# Patient Record
Sex: Female | Born: 1965
Health system: Southern US, Community
[De-identification: ages and names within clinical notes are randomized; demographics above are authoritative.]

## PROBLEM LIST (undated history)

## (undated) DIAGNOSIS — Z0000 Encounter for general adult medical examination without abnormal findings: Secondary | ICD-10-CM

## (undated) DIAGNOSIS — R102 Pelvic and perineal pain: Secondary | ICD-10-CM

## (undated) DIAGNOSIS — G43909 Migraine, unspecified, not intractable, without status migrainosus: Secondary | ICD-10-CM

## (undated) DIAGNOSIS — S82409A Unspecified fracture of shaft of unspecified fibula, initial encounter for closed fracture: Secondary | ICD-10-CM

## (undated) DIAGNOSIS — K5790 Diverticulosis of intestine, part unspecified, without perforation or abscess without bleeding: Secondary | ICD-10-CM

## (undated) DIAGNOSIS — K219 Gastro-esophageal reflux disease without esophagitis: Secondary | ICD-10-CM

## (undated) DIAGNOSIS — Z8489 Family history of other specified conditions: Secondary | ICD-10-CM

## (undated) DIAGNOSIS — E782 Mixed hyperlipidemia: Secondary | ICD-10-CM

## (undated) DIAGNOSIS — N941 Unspecified dyspareunia: Secondary | ICD-10-CM

## (undated) DIAGNOSIS — R21 Rash and other nonspecific skin eruption: Secondary | ICD-10-CM

## (undated) DIAGNOSIS — J302 Other seasonal allergic rhinitis: Secondary | ICD-10-CM

## (undated) DIAGNOSIS — Z86010 Personal history of colonic polyps: Secondary | ICD-10-CM

## (undated) DIAGNOSIS — J45909 Unspecified asthma, uncomplicated: Secondary | ICD-10-CM

## (undated) DIAGNOSIS — K649 Unspecified hemorrhoids: Secondary | ICD-10-CM

## (undated) DIAGNOSIS — Z9889 Other specified postprocedural states: Secondary | ICD-10-CM

## (undated) DIAGNOSIS — R112 Nausea with vomiting, unspecified: Secondary | ICD-10-CM

## (undated) DIAGNOSIS — L709 Acne, unspecified: Secondary | ICD-10-CM

## (undated) DIAGNOSIS — N3010 Interstitial cystitis (chronic) without hematuria: Secondary | ICD-10-CM

## (undated) DIAGNOSIS — Z87442 Personal history of urinary calculi: Secondary | ICD-10-CM

## (undated) DIAGNOSIS — M1990 Unspecified osteoarthritis, unspecified site: Secondary | ICD-10-CM

## (undated) DIAGNOSIS — K589 Irritable bowel syndrome without diarrhea: Principal | ICD-10-CM

## (undated) DIAGNOSIS — T7840XA Allergy, unspecified, initial encounter: Secondary | ICD-10-CM

## (undated) DIAGNOSIS — Z8719 Personal history of other diseases of the digestive system: Secondary | ICD-10-CM

## (undated) DIAGNOSIS — Z5189 Encounter for other specified aftercare: Secondary | ICD-10-CM

## (undated) HISTORY — DX: Encounter for general adult medical examination without abnormal findings: Z00.00

## (undated) HISTORY — DX: Allergy, unspecified, initial encounter: T78.40XA

## (undated) HISTORY — DX: Gastro-esophageal reflux disease without esophagitis: K21.9

## (undated) HISTORY — DX: Rash and other nonspecific skin eruption: R21

## (undated) HISTORY — DX: Mixed hyperlipidemia: E78.2

## (undated) HISTORY — PX: ABDOMINAL HYSTERECTOMY: SHX81

## (undated) HISTORY — PX: HEMORROIDECTOMY: SUR656

## (undated) HISTORY — DX: Unspecified osteoarthritis, unspecified site: M19.90

## (undated) HISTORY — PX: TOTAL KNEE ARTHROPLASTY: SHX125

## (undated) HISTORY — DX: Acne, unspecified: L70.9

## (undated) HISTORY — DX: Unspecified asthma, uncomplicated: J45.909

## (undated) HISTORY — DX: Irritable bowel syndrome without diarrhea: K58.9

## (undated) HISTORY — DX: Encounter for other specified aftercare: Z51.89

## (undated) HISTORY — PX: APPENDECTOMY: SHX54

---

## 2007-05-30 ENCOUNTER — Ambulatory Visit: Payer: Self-pay | Admitting: Gastroenterology

## 2007-05-30 LAB — CONVERTED CEMR LAB
Basophils Relative: 0.8 % (ref 0.0–1.0)
Eosinophils Relative: 1.4 % (ref 0.0–5.0)
Ferritin: 18.4 ng/mL (ref 10.0–291.0)
Folate: 16.7 ng/mL
Iron: 80 ug/dL (ref 42–145)
Monocytes Relative: 5.3 % (ref 3.0–11.0)
Neutro Abs: 4.2 10*3/uL (ref 1.4–7.7)
Platelets: 262 10*3/uL (ref 150–400)
RBC: 4.36 M/uL (ref 3.87–5.11)
RDW: 11.6 % (ref 11.5–14.6)
Saturation Ratios: 22.4 % (ref 20.0–50.0)
Vitamin B-12: 539 pg/mL (ref 211–911)
WBC: 5.6 10*3/uL (ref 4.5–10.5)

## 2007-09-24 DIAGNOSIS — M129 Arthropathy, unspecified: Secondary | ICD-10-CM | POA: Insufficient documentation

## 2007-09-24 DIAGNOSIS — Z87442 Personal history of urinary calculi: Secondary | ICD-10-CM | POA: Insufficient documentation

## 2007-09-24 DIAGNOSIS — G43909 Migraine, unspecified, not intractable, without status migrainosus: Secondary | ICD-10-CM | POA: Insufficient documentation

## 2007-09-24 DIAGNOSIS — N809 Endometriosis, unspecified: Secondary | ICD-10-CM | POA: Insufficient documentation

## 2010-12-13 NOTE — Assessment & Plan Note (Signed)
[facility]                         GASTROENTEROLOGY OFFICE NOTE   NAME:Cabrera, Alexandra NISHI                        MRN:          [id number]  DATE:05/30/2007                            DOB:          January 05, 1966    Ms. 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   [name] has had chronic problems with hemorrhoids and rectal bleeding and  has had hemorrhoidectomy in 1991-92.  She also has been bothered  with  endometriosis and has had a hysterectomy and appendectomy but has not  had an oophorectomy.  For several years, she has had intermittent rectal  bleeding, but recently this has been more severe and she describes  passage of blood clots.  Also, over the last three months, she has had a  constant, dull, aching sensation in her right lower quadrant with no  real association to bowel movements or other gastrointestinal function.  She saw Dr. Creta Levin, her primary care physician, who apparently did a  pelvic ultrasound exam, which was normal.  The patient denies associated  fever, chills, skin rashes, joint pains, oral stomatitis, general  malaise or other systemic complaints.  She gives no history of hepatitis  or pancreatitis and denies hepatobiliary or upper GI problems.  She  generally has loose bowel movements several times a day and denies  problems with constipation.  She has never had barium studies of her  bowels or colonoscopy.   The patient follows a regular diet and denies any specific food  intolerances.  She has had no anorexia or weight loss.  Her rectal  bleeding seems to be from a local source, and she is somewhat fixated on  her anorectal area and says that it appears horrible.  It sounds like  she does not use any local anal salves or creams.  She works in  [place], [place], and comes to [place] for evaluation.   PAST MEDICAL HISTORY:  1. Problems with arthritis  with a total right knee replacement and      seven revisions.  2. Problems with kidney stones and had cystoscopy and a temporary      ureteral stent placed. 3.  Hemorrhoidectomy.  3. Hysterectomy.  4. Appendectomy.  5. Rather severe migraine headaches and is on multiple medications.   She denies other chronic medical problems.   MEDICATIONS:  1. Topamax 200 mg twice a day.  2. Provigil 200 mg on weekends.  3. Maxalt 10 mg p.r.n.  4. Aspirin 81 mg a day.  5. Multivitamins daily.  6. Flaxseed oil 1000 mg a day.  7. Green tea daily.   ALLERGIES/SENSITIVITIES:  1. LATEX ALLERGY.  2. SENSITIVITY TO CODEINE.  3. ITCHING WITH MORPHINE.  4. ITCHING AND A RASH WITH PHENERGAN USES.  She uses Zyrtec on a p.r.n. basis.   FAMILY HISTORY:  Remarkable for her father, who has had colon polyps.  The patient relates that she was told that she had anorectal polyps at  the time of her hemorrhoid surgery.  Family history is also remarkable  for atherosclerotic cardiovascular  disease and diabetes.   SOCIAL HISTORY:  The patient is married and lives with her husband, has  a Naval architect.  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  She denies the use of cigarettes or alcohol.   REVIEW OF SYSTEMS:  Unremarkable otherwise.  She denies any current  Cardiovascular, Pulmonary, Genitourinary, or other Endocrine problems.  She does have chronic pain and swelling in her right knee but denies  NSAID abuse but does use p.r.n. Lodine.  She has migraine headaches;  these seem to be under fairly good control.   PHYSICAL EXAMINATION:  GENERAL:  She is an attractive, healthy-  appearing, white female in no distress.  VITAL SIGNS:  She is 5 feet tall, weighs 114 pounds.  Blood pressure is  120/60, pulse was 100 and regular.  HEENT/NECK:  I could not appreciate stigmata of chronic liver disease or  thyromegaly or lymphadenopathy.  CHEST:  Chest.  HEART:  Regular rhythm without  murmurs, gallops, or rubs.  ABDOMEN:  I could not appreciate hepatosplenomegaly, abdominal masses,  or significant tenderness.  Bowel sounds were normal.  EXTREMITIES:  There was no peripheral edema or phlebitis.  NEURO:  Mental status was clear.  RECTAL EXAM:  Inspection of the rectum was fairly unremarkable without  any active fissures or fistulae or protruding hemorrhoids.  Rectal exam  showed no masses or tenderness with some formed stool in the rectal  vault that was guaiac negative.   ASSESSMENT:  1. Intermittent rectal bleeding, which certainly sounds like it is      from an internal hemorrhoid source.  Of course, with her family      history of colon polyps, we need to exclude  colonic polyposis, she      has never had colonoscopy or barium enema exams.  2. History of rather severe migraine headaches with multiple      medications.  3. Right lower quadrant pain of unexplained etiology - rule out      inflammatory bowel disease.  4. Status post hysterectomy and appendectomy.  5. History of kidney stones.   RECOMMENDATIONS:  I have discussed colonoscopy and alternative methods  of examining the colon with Alexandra Cabrera including CT colonoscopy.  We looked  into this matter, and her insurance company will not pay for this, she  does not wish to proceed along these lines, and we will schedule  colonoscopy.  I reviewed the risks and benefits in my experience with  her in detail.  If her colonoscopy is unremarkable and we see prominent  hemorrhoids, we will probably consider hypertonic saline injection at  that time.  I have asked her to continue all of her other medications  outlined above, and I have sent her by the lab to check a CBC and anemia  profile.     Vania Rea. Jarold Motto, MD, Caleen Essex, FAGA  Electronically Signed    DRP/MedQ  DD: 05/30/2007  DT: 05/30/2007  Job #: [id number]   cc:   Warrick Parisian, M.D.

## 2011-08-10 DIAGNOSIS — N3010 Interstitial cystitis (chronic) without hematuria: Secondary | ICD-10-CM | POA: Insufficient documentation

## 2011-12-22 ENCOUNTER — Encounter (HOSPITAL_COMMUNITY): Payer: Self-pay | Admitting: Nurse Practitioner

## 2011-12-22 ENCOUNTER — Emergency Department (HOSPITAL_BASED_OUTPATIENT_CLINIC_OR_DEPARTMENT_OTHER)
Admission: EM | Admit: 2011-12-22 | Discharge: 2011-12-22 | Disposition: A | Discharge status: Other Acute Inpatient Hospital | Payer: 59 | Source: Home / Self Care | Attending: Emergency Medicine | Admitting: Emergency Medicine

## 2011-12-22 ENCOUNTER — Emergency Department (HOSPITAL_BASED_OUTPATIENT_CLINIC_OR_DEPARTMENT_OTHER): Payer: 59

## 2011-12-22 ENCOUNTER — Emergency Department (HOSPITAL_COMMUNITY)
Admission: EM | Admit: 2011-12-22 | Discharge: 2011-12-22 | Disposition: A | Discharge status: Home / Self Care | Payer: 59 | Attending: Emergency Medicine | Admitting: Emergency Medicine

## 2011-12-22 ENCOUNTER — Encounter (HOSPITAL_BASED_OUTPATIENT_CLINIC_OR_DEPARTMENT_OTHER): Payer: Self-pay | Admitting: *Deleted

## 2011-12-22 DIAGNOSIS — R51 Headache: Secondary | ICD-10-CM | POA: Insufficient documentation

## 2011-12-22 DIAGNOSIS — H5710 Ocular pain, unspecified eye: Secondary | ICD-10-CM | POA: Insufficient documentation

## 2011-12-22 DIAGNOSIS — H538 Other visual disturbances: Secondary | ICD-10-CM

## 2011-12-22 DIAGNOSIS — H5712 Ocular pain, left eye: Secondary | ICD-10-CM

## 2011-12-22 HISTORY — DX: Migraine, unspecified, not intractable, without status migrainosus: G43.909

## 2011-12-22 MED ORDER — TETRACAINE HCL 0.5 % OP SOLN
2.0000 [drp] | Freq: Once | OPHTHALMIC | Status: AC
  Administered 2011-12-22: 2 [drp] via OPHTHALMIC
  Filled 2011-12-22: qty 2

## 2011-12-22 NOTE — ED Provider Notes (Signed)
I saw and evaluated the patient, reviewed the resident's note and I agree with the findings and plan.  Ethelda Chick, MD 12/22/11 2138

## 2011-12-22 NOTE — ED Provider Notes (Signed)
History     CSN: [id number]  Arrival date & time 12/22/11  1311   First MD Initiated Contact with Patient 12/22/11 1306      Chief Complaint  Patient presents with  . Migraine    (Consider location/radiation/quality/duration/timing/severity/associated sxs/prior treatment) HPI The patient is a 46 year old female with a history of migraines. She reports that this morning she experienced her typical migraine with some associated nausea. She took Imitrex for this as well as 800 mg of Motrin by mouth. Patient had improvement in her headache but did note right before she left her home to leave for work that she had blurry vision in her left eye. Patient does not normally have complicated migraines with any associated visual or neurologic changes.She denies any injury to the eye.  She denies eye pain or headache at this time. 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 This is very abnormal for this patient. Patient denies any other neurologic symptoms. She has no other significant medical history. Patient has been feeling well otherwise. There are no other associated or modifying factors. History reviewed. No pertinent past medical history.  Past Surgical History  Procedure Date  . Total knee arthroplasty   . Abdominal hysterectomy   . Appendectomy     History reviewed. No pertinent family history.  History  Substance Use Topics  . Smoking status: Never Smoker   . Smokeless tobacco: Not on file  . Alcohol Use: No    OB History    Grav Para Term Preterm Abortions TAB SAB Ect Mult Living                  Review of Systems  Constitutional: Negative.   Eyes: Positive for visual disturbance.  Respiratory: Negative.   Cardiovascular: Negative.   Gastrointestinal: Negative.   Genitourinary: Negative.   Musculoskeletal: Negative.   Skin: Negative.   Neurological: Positive for  headaches.  Hematological: Negative.   Psychiatric/Behavioral: Negative.   All other systems reviewed and are negative.    Allergies  Latex and Dilaudid  Home Medications   Current Outpatient Rx  Name Route Sig Dispense Refill  . ESTRADIOL ACETATE PO Oral Take by mouth.    . IMITREX PO Oral Take by mouth.      BP 134/88  Pulse 80  Temp(Src) 98 F (36.7 C) (Oral)  Resp 20  SpO2 100%  Physical Exam  Nursing note and vitals reviewed. Constitutional: She is oriented to person, place, and time. She appears well-developed and well-nourished. No distress.  HENT:  Head: Normocephalic and atraumatic.  Eyes: Conjunctivae and EOM are normal. No scleral icterus. Pupils are unequal.       tono pen exam with left pressure of 14 and right of 10  Neck: Normal range of motion.  Cardiovascular: Normal rate and regular rhythm.   Pulmonary/Chest: Effort normal.  Musculoskeletal: Normal range of motion.  Neurological: She is alert and oriented to person, place, and time. She exhibits normal muscle tone. Coordination normal.       Visual acuity R eye 20/25 L eye 20/50, both: 20/25  Skin: Skin is warm and dry. No rash noted.  Psychiatric: She has a normal mood and affect.    ED Course  Procedures (including critical care time)  Labs Reviewed - No data to display Ct Head Wo Contrast  12/22/2011  *RADIOLOGY REPORT*  Clinical Data: Headache  and blurred vision.  CT HEAD WITHOUT CONTRAST  Technique:  Contiguous axial images were obtained from the base of the skull through the vertex without contrast.  Comparison: None.  Findings: The brain appears normal without evidence of infarction, hemorrhage, mass lesion, mass effect, midline shift or abnormal extra-axial fluid collection.  No hydrocephalus or pneumocephalus. Imaged paranasal sinuses and mastoid air cells are clear. Calvarium is intact.  IMPRESSION: Negative exam.  Original Report Authenticated By: Bernadene Bell. D'ALESSIO, M.D.     1.  Blurred vision, left eye       MDM  Patient was evaluated by myself. Patient did have a persistently dilated left pupil despite resolution of her migraine. Patient has never had this before. This was also associated with blurred vision in the left eye. During the patient's visit there were moments where her left side did not appear as dilated however her visual changes never resolved. Patient had a noncontrasted CT that was within normal limits. Eye exam was unrevealing. I discussed the patient with neurology attending, Dr. Thad Ranger. She felt that the patient would need a dilated eye exam today. She did not advise further imaging at this time. I spoke with Dr. Gwen Pounds who was the attending ophthalmologist on call. He agreed to evaluate the patient at [facility] but also could not think of a clear explanation for the patient's presentation particularly given that there had been a few brief moments where her her pupillary discrepancy had resolved. Patient was sent to [facility] for dilated eye exam with Dr. Gwen Pounds. Patient was discharged in good condition and will be taken by personal vehicle driven by her husband to [facility] for further evaluation.  Cyndra Numbers, MD 12/22/11 1623

## 2011-12-22 NOTE — ED Notes (Signed)
Report called to Christy Gentles RN

## 2011-12-22 NOTE — Discharge Instructions (Signed)
Blurred Vision You have been seen today complaining of blurred vision. This means you have a loss of ability to see small details.  CAUSES  Blurred vision can be a symptom of underlying eye problems, such as:  Aging of the eye (presbyopia).   Glaucoma.   Cataracts.   Eye infection.   Eye-related migraine.   Diabetes mellitus.   Fatigue.   Migraine headaches.   High blood pressure.   Breakdown of the back of the eye (macular degeneration).   Problems caused by some medications.  The most common cause of blurred vision is the need for eyeglasses or a new prescription. Today in the emergency department, no cause for your blurred vision can be found. SYMPTOMS  Blurred vision is the loss of visual sharpness and detail (acuity). DIAGNOSIS  Should blurred vision continue, you should see your caregiver. If your caregiver is your primary care physician, he or she may choose to refer you to another specialist.  TREATMENT  Do not ignore your blurred vision. Make sure to have it checked out to see if further treatment or referral is necessary. SEEK MEDICAL CARE IF:  You are unable to get into a specialist so we can help you with a referral. SEEK IMMEDIATE MEDICAL CARE IF: You have severe eye pain, severe headache, or sudden loss of vision. MAKE SURE YOU:   Understand these instructions.   Will watch your condition.   Will get help right away if you are not doing well or get worse.  Document Released: 07/20/2003 Document Revised: 07/06/2011 Document Reviewed: 02/19/2008 [facility] Patient Information 2012 [place], [place].Visual Disturbances You have had a disturbance in your vision. This may be caused by various conditions, such as:  Migraines. Migraine headaches are often preceded by a disturbance in vision. Blind spots or light flashes are followed by a headache. This type of visual disturbance is temporary. It does not damage the eye.   Glaucoma. This is caused by increased  pressure in the eye. Symptoms include haziness, blurred vision, or seeing rainbow colored circles when looking at bright lights. Partial or complete visual loss can occur. You may or may not experience eye pain. Visual loss may be gradual or sudden and is irreversible. Glaucoma is the leading cause of blindness.   Retina problems. Vision will be reduced if the retina becomes detached or if there is a circulation problem as with diabetes, high blood pressure, or a mini-stroke. Symptoms include seeing "floaters," flashes of light, or shadows, as if a curtain has fallen over your eye.   Optic nerve problems. The main nerve in your eye can be damaged by redness, soreness, and swelling (inflammation), poor circulation, drugs, and toxins.  It is very important to have a complete exam done by a specialist to determine the exact cause of your eye problem. The specialist may recommend medicines or surgery, depending on the cause of the problem. This can help prevent further loss of vision or reduce the risk of having a stroke. Contact the caregiver to whom you have been referred and arrange for follow-up care right away. SEEK IMMEDIATE MEDICAL CARE IF:   Your vision gets worse.   You develop severe headaches.   You have any weakness or numbness in the face, arms, or legs.   You have any trouble speaking or walking.  Document Released: 08/24/2004 Document Revised: 07/06/2011 Document Reviewed: 12/15/2009 [facility] Patient Information 2012 [place], [place].

## 2011-12-22 NOTE — ED Notes (Signed)
Headache this am. Pupils are unequal on arrival. Right pupil 3mm left pupil 5mm both react to light. Headache was relieved by Imitrex.

## 2011-12-22 NOTE — ED Notes (Signed)
Pt states she noticed her L pupil was dilated this am, went to med center had ct scan and was told to come directly to [facility] to meet with dr Allen Kell for eye exam. States ct scan was normal. C/o "pressure" behind L eye now. Denies any injuries. Did have a migraine this am, which she took imitrex for at 0800

## 2011-12-22 NOTE — ED Provider Notes (Signed)
History     CSN: [id number]  Arrival date & time 12/22/11  1628   First MD Initiated Contact with Patient 12/22/11 1647      Chief Complaint  Patient presents with  . Eye Problem    (Consider location/radiation/quality/duration/timing/severity/associated sxs/prior treatment) HPI Comments: Pain behind L eye today different from typical headaches.  L pupil larger than right today.  CT at OSH ED.  Sent for eval by ophtho.  Otherwise doing well.  Patient is a 46 y.o. female presenting with eye problem. The history is provided by the patient.  Eye Problem  This is a new problem. Episode onset: today. The problem occurs constantly. The problem has not changed since onset.There is pain in the left eye. There was no injury mechanism. The pain is mild (posterior eye pressure). There is no history of trauma to the eye. Pertinent negatives include no numbness, no double vision, no eye redness, no nausea, no vomiting and no weakness. She has tried nothing for the symptoms.    Past Medical History  Diagnosis Date  . Migraines     Past Surgical History  Procedure Date  . Total knee arthroplasty   . Abdominal hysterectomy   . Appendectomy     History reviewed. No pertinent family history.  History  Substance Use Topics  . Smoking status: Never Smoker   . Smokeless tobacco: Not on file  . Alcohol Use: No    OB History    Grav Para Term Preterm Abortions TAB SAB Ect Mult Living                  Review of Systems  Constitutional: Negative for activity change.  HENT: Negative for congestion.   Eyes: Negative for double vision, redness and visual disturbance.  Respiratory: Negative for chest tightness and shortness of breath.   Cardiovascular: Negative for chest pain and leg swelling.  Gastrointestinal: Negative for nausea, vomiting and abdominal pain.  Genitourinary: Negative for dysuria.  Skin: Negative for rash.  Neurological: Negative for syncope, weakness and numbness.    Psychiatric/Behavioral: Negative for behavioral problems.    Allergies  Latex and Dilaudid  Home Medications   Current Outpatient Rx  Name Route Sig Dispense Refill  . ESTRADIOL ACETATE PO Oral Take 0.5 mg by mouth daily.     Marland Kitchen FLAX SEEDS PO Oral Take 1 capsule by mouth daily.    . IBUPROFEN 800 MG PO TABS Oral Take 800 mg by mouth every 8 (eight) hours as needed. For pain    . SUMATRIPTAN SUCCINATE 25 MG PO TABS Oral Take 25 mg by mouth every 2 (two) hours as needed. For migraine      BP 131/76  Pulse 77  Temp(Src) 97.9 F (36.6 C) (Oral)  Resp 20  Ht 5' (1.524 m)  Wt 133 lb (60.328 kg)  BMI 25.97 kg/m2  SpO2 100%  Physical Exam  Eyes:       L pupil slightly larger than right.  Good response to light.  Fundoscopic exam without abnormality.    ED Course  Procedures (including critical care time)  Labs Reviewed - No data to display Ct Head Wo Contrast  12/22/2011  *RADIOLOGY REPORT*  Clinical Data: Headache and blurred vision.  CT HEAD WITHOUT CONTRAST  Technique:  Contiguous axial images were obtained from the base of the skull through the vertex without contrast.  Comparison: None.  Findings: The brain appears normal without evidence of infarction, hemorrhage, mass lesion, mass effect, midline shift or  abnormal extra-axial fluid collection.  No hydrocephalus or pneumocephalus. Imaged paranasal sinuses and mastoid air cells are clear. Calvarium is intact.  IMPRESSION: Negative exam.  Original Report Authenticated By: Bernadene Bell. D'ALESSIO, M.D.     1. Eye pain, left       MDM  Pain behind L eye today different from typical headaches.  L pupil larger than right today.  CT at OSH ED.  Sent for eval by ophtho.  Otherwise doing well.  IOPs, visual acuity tested at OSH PTA.  Slightly decreased acuity in L eye at OSH.  VSS and well appearing here.  Ophtho coming to see pt.  6:30 PM Pt says she no longer wants to wait for the ophthalmologist and needs to go back to work.   Etiology of symptoms unclear but no feel outpatient f/u with ophtho to be reasonable.        Army Chaco, MD 12/22/11 [phone]

## 2012-08-07 ENCOUNTER — Ambulatory Visit: Payer: 59 | Admitting: Internal Medicine

## 2012-09-09 ENCOUNTER — Ambulatory Visit (INDEPENDENT_AMBULATORY_CARE_PROVIDER_SITE_OTHER): Payer: 59 | Admitting: Internal Medicine

## 2012-09-09 ENCOUNTER — Encounter: Payer: Self-pay | Admitting: Internal Medicine

## 2012-09-09 ENCOUNTER — Telehealth: Payer: Self-pay | Admitting: *Deleted

## 2012-09-09 ENCOUNTER — Ambulatory Visit (HOSPITAL_BASED_OUTPATIENT_CLINIC_OR_DEPARTMENT_OTHER)
Admission: RE | Admit: 2012-09-09 | Discharge: 2012-09-09 | Disposition: A | Discharge status: Home / Self Care | Payer: 59 | Source: Ambulatory Visit | Attending: Internal Medicine | Admitting: Internal Medicine

## 2012-09-09 VITALS — BP 120/76 | HR 78 | Temp 97.5°F | Resp 18 | Ht 60.0 in | Wt 132.0 lb

## 2012-09-09 DIAGNOSIS — Z1231 Encounter for screening mammogram for malignant neoplasm of breast: Secondary | ICD-10-CM | POA: Insufficient documentation

## 2012-09-09 DIAGNOSIS — Z780 Asymptomatic menopausal state: Secondary | ICD-10-CM | POA: Insufficient documentation

## 2012-09-09 DIAGNOSIS — Z803 Family history of malignant neoplasm of breast: Secondary | ICD-10-CM

## 2012-09-09 DIAGNOSIS — N951 Menopausal and female climacteric states: Secondary | ICD-10-CM | POA: Insufficient documentation

## 2012-09-09 DIAGNOSIS — Z90710 Acquired absence of both cervix and uterus: Secondary | ICD-10-CM

## 2012-09-09 LAB — LIPID PANEL
Cholesterol: 193 mg/dL (ref 0–200)
Total CHOL/HDL Ratio: 3 Ratio
Triglycerides: 89 mg/dL (ref <150)
VLDL: 18 mg/dL (ref 0–40)

## 2012-09-09 LAB — CBC WITH DIFFERENTIAL/PLATELET
Eosinophils Absolute: 0.1 10*3/uL (ref 0.0–0.7)
Eosinophils Relative: 1 % (ref 0–5)
HCT: 41 % (ref 36.0–46.0)
Hemoglobin: 14 g/dL (ref 12.0–15.0)
Lymphs Abs: 1.2 10*3/uL (ref 0.7–4.0)
MCH: 31.3 pg (ref 26.0–34.0)
MCV: 91.5 fL (ref 78.0–100.0)
Monocytes Absolute: 0.4 10*3/uL (ref 0.1–1.0)
Monocytes Relative: 6 % (ref 3–12)
Platelets: 275 10*3/uL (ref 150–400)
RBC: 4.48 MIL/uL (ref 3.87–5.11)

## 2012-09-09 LAB — COMPREHENSIVE METABOLIC PANEL
BUN: 17 mg/dL (ref 6–23)
CO2: 30 mEq/L (ref 19–32)
Calcium: 9.6 mg/dL (ref 8.4–10.5)
Creat: 0.7 mg/dL (ref 0.50–1.10)
Glucose, Bld: 85 mg/dL (ref 70–99)
Total Bilirubin: 0.3 mg/dL (ref 0.3–1.2)

## 2012-09-09 MED ORDER — IBUPROFEN 800 MG PO TABS: ORAL_TABLET | ORAL | Status: DC

## 2012-09-09 MED ORDER — ONDANSETRON 4 MG PO TBDP: 4.0000 mg | ORAL_TABLET | Freq: Three times a day (TID) | ORAL | Status: DC | PRN

## 2012-09-09 NOTE — Progress Notes (Signed)
Subjective:    Patient ID: Alexandra Cabrera, female    DOB: 22-Dec-1965, 47 y.o.   MRN: [id number]  HPI Alexandra Cabrera is a new pt here for first visit.  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    Migraines  She was evaluated by neurologist in [place] and tried Topamax but unable to tolerate.  She uses Imitrex or 800 mg of Motrin now  Surgical menopause.  She reports patches were ineffective and she titrated her Estradiol to  2 mg daily which controls her hot flushes quite well.  FH breast cancer in maternal aunt  Allergies  Allergen Reactions  . Latex Anaphylaxis  . Dilaudid (Hydromorphone Hcl) Other (See Comments)    unknown   Past Medical History  Diagnosis Date  . Migraines    Past Surgical History  Procedure Laterality Date  . Total knee arthroplasty    . Abdominal hysterectomy    . Appendectomy     History   Social History  . Marital Status: Married    Spouse Name: N/A    Number of Children: N/A  . Years of Education: N/A   Occupational History  . Not on file.   Social History Main Topics  . Smoking status: Never Smoker   . Smokeless tobacco: Not on file  . Alcohol Use: No  . Drug Use: No  . Sexually Active:    Other Topics Concern  . Not on file   Social History Narrative  . No narrative on file   No family history on file. Patient Active Problem List  Diagnosis  . MIGRAINE HEADACHE  . ENDOMETRIOSIS  . ARTHRITIS  . NEPHROLITHIASIS, HX OF  . S/P hysterectomy  . Menopause   Current Outpatient Prescriptions on File Prior to Visit  Medication Sig Dispense Refill  . ESTRADIOL ACETATE PO Take 0.5 mg by mouth daily.       . Flaxseed, Linseed, (FLAX SEEDS PO) Take 1 capsule by mouth daily.      Marland Kitchen ibuprofen (ADVIL,MOTRIN) 800 MG tablet Take 800 mg by mouth every 8 (eight) hours as needed. For pain      . SUMAtriptan (IMITREX) 25 MG tablet Take 25 mg by mouth every 2  (two) hours as needed. For migraine       No current facility-administered medications on file prior to visit.       Review of Systems    see HPI Objective:   Physical Exam Physical Exam  Nursing note and vitals reviewed.  Constitutional: She is oriented to person, place, and time. She appears well-developed and well-nourished.  HENT:  Head: Normocephalic and atraumatic.  Cardiovascular: Normal rate and regular rhythm. Exam reveals no gallop and no friction rub.  No murmur heard.  Pulmonary/Chest: Breath sounds normal. She has no wheezes. She has no rales.  Neurological: She is alert and oriented to person, place, and time.  Skin: Skin is warm and dry.  Psychiatric: She has a normal mood and affect. Her behavior is normal.              Assessment & Plan:  Migraine headaches  Will order Motrin 800 mg at onset of headache and q6h prn.  Also RX for Zofran ODT 4 mg prn  Continue Imitrex  Menopause  OK for estradiol  2 mg.  To get mm today as she states she has  not had one in 4-5 years  History of renal calculus  Mm and labs today  Schedule CPE

## 2012-09-09 NOTE — Patient Instructions (Addendum)
Activate my chart  Mammogram today

## 2012-09-10 ENCOUNTER — Encounter: Payer: Self-pay | Admitting: *Deleted

## 2012-09-18 NOTE — Telephone Encounter (Signed)
results

## 2012-12-18 ENCOUNTER — Encounter: Payer: 59 | Admitting: Internal Medicine

## 2013-02-03 ENCOUNTER — Other Ambulatory Visit: Payer: Self-pay | Admitting: *Deleted

## 2013-02-03 MED ORDER — IBUPROFEN 800 MG PO TABS: ORAL_TABLET | ORAL | Status: DC

## 2013-02-03 MED ORDER — ONDANSETRON 4 MG PO TBDP: 4.0000 mg | ORAL_TABLET | Freq: Three times a day (TID) | ORAL | Status: DC | PRN

## 2013-02-03 MED ORDER — SUMATRIPTAN SUCCINATE 25 MG PO TABS: 25.0000 mg | ORAL_TABLET | ORAL | Status: DC | PRN

## 2013-02-03 NOTE — Telephone Encounter (Signed)
Pt did not pick these up when prescribed in Feb because she still had some available she went to pick them up this am and was told by pharmacy she needed a new rx

## 2013-03-27 ENCOUNTER — Other Ambulatory Visit: Payer: Self-pay | Admitting: *Deleted

## 2013-03-27 NOTE — Telephone Encounter (Signed)
Refill request from pharmacy /

## 2013-03-28 ENCOUNTER — Other Ambulatory Visit: Payer: Self-pay | Admitting: *Deleted

## 2013-03-28 NOTE — Telephone Encounter (Signed)
Pt has a CPE on Sept 9th pt is out of medication and has a migraine

## 2013-03-29 MED ORDER — SUMATRIPTAN SUCCINATE 25 MG PO TABS: ORAL_TABLET | ORAL | Status: DC

## 2013-04-01 ENCOUNTER — Other Ambulatory Visit: Payer: Self-pay | Admitting: *Deleted

## 2013-04-01 NOTE — Telephone Encounter (Signed)
Error

## 2013-04-03 ENCOUNTER — Other Ambulatory Visit: Payer: Self-pay | Admitting: *Deleted

## 2013-04-03 MED ORDER — SUMATRIPTAN SUCCINATE 50 MG PO TABS: ORAL_TABLET | ORAL | Status: DC

## 2013-04-03 NOTE — Telephone Encounter (Signed)
Refill request

## 2013-04-08 ENCOUNTER — Ambulatory Visit (INDEPENDENT_AMBULATORY_CARE_PROVIDER_SITE_OTHER): Payer: 59 | Admitting: Internal Medicine

## 2013-04-08 ENCOUNTER — Encounter: Payer: Self-pay | Admitting: Internal Medicine

## 2013-04-08 VITALS — BP 127/83 | HR 73 | Temp 97.0°F | Resp 18 | Wt 130.0 lb

## 2013-04-08 DIAGNOSIS — Z87442 Personal history of urinary calculi: Secondary | ICD-10-CM

## 2013-04-08 DIAGNOSIS — G43909 Migraine, unspecified, not intractable, without status migrainosus: Secondary | ICD-10-CM

## 2013-04-08 DIAGNOSIS — Z90710 Acquired absence of both cervix and uterus: Secondary | ICD-10-CM

## 2013-04-08 DIAGNOSIS — M129 Arthropathy, unspecified: Secondary | ICD-10-CM

## 2013-04-08 DIAGNOSIS — N951 Menopausal and female climacteric states: Secondary | ICD-10-CM

## 2013-04-08 DIAGNOSIS — Z0000 Encounter for general adult medical examination without abnormal findings: Secondary | ICD-10-CM

## 2013-04-08 LAB — POCT URINALYSIS DIPSTICK
Bilirubin, UA: NEGATIVE
Blood, UA: NEGATIVE
Glucose, UA: NEGATIVE
Ketones, UA: NEGATIVE
Leukocytes, UA: NEGATIVE
pH, UA: 6.5

## 2013-04-08 NOTE — Progress Notes (Signed)
Subjective:    Patient ID: Alexandra Cabrera, female    DOB: 03-12-1966, 47 y.o.   MRN: [id number]  HPI  Alexandra Cabrera is here for CPE.  Overall doing well but migraines exacerbate when she is hungry or with lights in ER.  She sometimes low on Imitrex and would like to try Maxalt.   Had some blood in urine a few weeks ago but states  " I must have passed the stone because I have no pain"  She has seen Dr. Lindley Cabrera.  Allergies  Allergen Reactions  . Latex Anaphylaxis  . Phenergan [Promethazine Hcl] Nausea And Vomiting  . Dilaudid [Hydromorphone Hcl] Other (See Comments)    unknown  . Eggs Or Egg-Derived Products   . Reglan [Metoclopramide] Anxiety   Past Medical History  Diagnosis Date  . Migraines    Past Surgical History  Procedure Laterality Date  . Total knee arthroplasty    . Abdominal hysterectomy    . Appendectomy     History   Social History  . Marital Status: Married    Spouse Name: N/A    Number of Children: N/A  . Years of Education: N/A   Occupational History  . Not on file.   Social History Main Topics  . Smoking status: Never Smoker   . Smokeless tobacco: Not on file  . Alcohol Use: No  . Drug Use: No  . Sexual Activity: Yes    Birth Control/ Protection: Surgical   Other Topics Concern  . Not on file   Social History Narrative  . No narrative on file   Family History  Problem Relation Age of Onset  . Diabetes Mother   . Arthritis Father   . Arthritis Sister   . Arthritis Brother   . Cancer Maternal Grandmother 45    ovarian  . Arthritis Paternal Grandmother   . Heart disease Paternal Grandfather    Patient Active Problem List   Diagnosis Date Noted  . S/P hysterectomy 09/09/2012  . Menopause 09/09/2012  . Hot flushes, perimenopausal 09/09/2012  . Family history of breast cancer in female 09/09/2012  . MIGRAINE HEADACHE 09/24/2007  . ENDOMETRIOSIS 09/24/2007  . ARTHRITIS 09/24/2007  . NEPHROLITHIASIS, HX OF 09/24/2007   Current Outpatient  Prescriptions on File Prior to Visit  Medication Sig Dispense Refill  . estradiol (ESTRACE) 2 MG tablet Take 2 mg by mouth daily.      . Flaxseed, Linseed, (FLAX SEEDS PO) Take 1 capsule by mouth daily.      Marland Kitchen ibuprofen (ADVIL,MOTRIN) 800 MG tablet Take one at onset of migraine.  May repeat in 6 hours prn  30 tablet  1  . ondansetron (ZOFRAN ODT) 4 MG disintegrating tablet Take 1 tablet (4 mg total) by mouth every 8 (eight) hours as needed for nausea.  20 tablet  0  . SUMAtriptan (IMITREX) 50 MG tablet Take one at headache onset.  May repeat in 2 hours one time only  10 tablet  1   No current facility-administered medications on file prior to visit.      Review of Systems  Genitourinary: Negative for urgency, frequency and flank pain.  All other systems reviewed and are negative.       Objective:   Physical Exam Physical Exam  Nursing note and vitals reviewed.  Constitutional: She is oriented to person, place, and time. She appears well-developed and well-nourished.  HENT:  Head: Normocephalic and atraumatic.  Right Ear: Tympanic membrane and ear canal normal. No drainage. Tympanic  membrane is not injected and not erythematous.  Left Ear: Tympanic membrane and ear canal normal. No drainage. Tympanic membrane is not injected and not erythematous.  Nose: Nose normal. Right sinus exhibits no maxillary sinus tenderness and no frontal sinus tenderness. Left sinus exhibits no maxillary sinus tenderness and no frontal sinus tenderness.  Mouth/Throat: Oropharynx is clear and moist. No oral lesions. No oropharyngeal exudate.  Eyes: Conjunctivae and EOM are normal. Pupils are equal, round, and reactive to light.  Neck: Normal range of motion. Neck supple. No JVD present. Carotid bruit is not present. No mass and no thyromegaly present.  Cardiovascular: Normal rate, regular rhythm, S1 normal, S2 normal and intact distal pulses. Exam reveals no gallop and no friction rub.  No murmur heard.   Pulses:  Carotid pulses are 2+ on the right side, and 2+ on the left side.  Dorsalis pedis pulses are 2+ on the right side, and 2+ on the left side.  No carotid bruit. No LE edema  Pulmonary/Chest: Breath sounds normal. She has no wheezes. She has no rales. She exhibits no tenderness.   Breast  No discrete mass no nipple discharge no axillary adenopathy bilaterall Abdominal: Soft. Bowel sounds are normal. She exhibits no distension and no mass. There is no hepatosplenomegaly. There is no tenderness. There is no CVA tenderness.  Musculoskeletal: Normal range of motion.  No active synovitis to joints.  Lymphadenopathy:  She has no cervical adenopathy.  She has no axillary adenopathy.  Right: No inguinal and no supraclavicular adenopathy present.  Left: No inguinal and no supraclavicular adenopathy present.  Neurological: She is alert and oriented to person, place, and time. She has normal strength and normal reflexes. She displays no tremor. No cranial nerve deficit or sensory deficit. Coordination and gait normal.  Skin: Skin is warm and dry. No rash noted. No cyanosis. Nails show no clubbing.  Psychiatric: She has a normal mood and affect. Her speech is normal and behavior is normal. Cognition and memory are normal.          Assessment & Plan:  Health Maintenance:  U/A neg today.  UTD with vaccines.  Influenza pending.  See scanned sheet.  Advised calcium and vitamin D  Migraine  Will continue triptan.   She wishes to check with pharmacy and if Maxalt is less expensive  Ok to change to Maxalt  DJD knees and hands    S/P  Hyst BSO/  Hot flushes  Continue  Estradiol 2 mg.  Advised yearly mm  Nephrolithiasis  Neg hgb in urine today.  Managed by Dr. Lindley Cabrera

## 2013-04-08 NOTE — Addendum Note (Signed)
Addended by: Mathews Robinsons on: 04/08/2013 10:39 AM   Modules accepted: Orders

## 2013-05-27 ENCOUNTER — Other Ambulatory Visit: Payer: Self-pay | Admitting: *Deleted

## 2013-05-27 NOTE — Telephone Encounter (Signed)
Refill request

## 2013-05-28 MED ORDER — SUMATRIPTAN SUCCINATE 50 MG PO TABS: ORAL_TABLET | ORAL | Status: DC

## 2013-06-05 ENCOUNTER — Other Ambulatory Visit: Payer: Self-pay

## 2013-06-25 ENCOUNTER — Other Ambulatory Visit: Payer: Self-pay | Admitting: Internal Medicine

## 2013-06-25 MED ORDER — ESTRADIOL 2 MG PO TABS: 2.0000 mg | ORAL_TABLET | Freq: Every day | ORAL | Status: DC

## 2013-06-25 NOTE — Telephone Encounter (Signed)
refilll request

## 2013-06-30 ENCOUNTER — Other Ambulatory Visit: Payer: Self-pay | Admitting: *Deleted

## 2013-08-04 ENCOUNTER — Other Ambulatory Visit: Payer: Self-pay | Admitting: *Deleted

## 2013-08-04 MED ORDER — SUMATRIPTAN SUCCINATE 50 MG PO TABS: ORAL_TABLET | ORAL | Status: DC

## 2013-08-04 NOTE — Telephone Encounter (Signed)
Refill request

## 2013-08-26 ENCOUNTER — Other Ambulatory Visit: Payer: Self-pay | Admitting: Internal Medicine

## 2013-08-27 NOTE — Telephone Encounter (Signed)
Refill request

## 2013-08-28 ENCOUNTER — Other Ambulatory Visit: Payer: Self-pay | Admitting: *Deleted

## 2013-08-28 DIAGNOSIS — Z139 Encounter for screening, unspecified: Secondary | ICD-10-CM

## 2013-08-28 NOTE — Telephone Encounter (Signed)
Notified pt that medication will be called in for 30 days LVM awaiting return call

## 2013-08-28 NOTE — Telephone Encounter (Signed)
[facility] [employer] and let her know that I refilled #30 of her estradiol.  She is due for a repeat mammogram on 2/11  Advise her that she has extreme breast density and should get a 3D mm because of this density..  Schedule this for her any day after 2/11.  I will refill more of her estrogen once I know her mm is fine

## 2013-09-18 ENCOUNTER — Ambulatory Visit (HOSPITAL_COMMUNITY): Payer: 59

## 2013-09-23 ENCOUNTER — Ambulatory Visit (HOSPITAL_COMMUNITY): Admission: RE | Admit: 2013-09-23 | Payer: 59 | Source: Ambulatory Visit

## 2013-10-06 ENCOUNTER — Telehealth: Payer: Self-pay | Admitting: *Deleted

## 2013-10-06 ENCOUNTER — Other Ambulatory Visit: Payer: Self-pay | Admitting: *Deleted

## 2013-10-06 MED ORDER — ESTRADIOL 2 MG PO TABS: 2.0000 mg | ORAL_TABLET | Freq: Every day | ORAL | Status: DC

## 2013-10-06 MED ORDER — RIZATRIPTAN BENZOATE 10 MG PO TABS: 10.0000 mg | ORAL_TABLET | ORAL | Status: DC | PRN

## 2013-10-06 NOTE — Telephone Encounter (Signed)
Pt has appt for MM tomorrow, and is out of Imitrex last time we did maxalt until she could refill her Imitrex because they can only do 10 tabs at a time

## 2013-10-06 NOTE — Telephone Encounter (Signed)
Alexandra Cabrera called wanting to know if she can get a RX for Maxalt for migraines.  She is out of Imitrex and can't get it filled until 11/03/13 and needs something to get her through until then.  Also she is 10 behind on her Estridiol.  She is going for her Mammogram tomorrow and wanted to make sure that we will call that in ASAP.

## 2013-10-07 ENCOUNTER — Ambulatory Visit (HOSPITAL_COMMUNITY)
Admission: RE | Admit: 2013-10-07 | Discharge: 2013-10-07 | Disposition: A | Discharge status: Home / Self Care | Payer: 59 | Source: Ambulatory Visit | Attending: Internal Medicine | Admitting: Internal Medicine

## 2013-10-07 DIAGNOSIS — Z139 Encounter for screening, unspecified: Secondary | ICD-10-CM

## 2013-10-07 DIAGNOSIS — Z1231 Encounter for screening mammogram for malignant neoplasm of breast: Secondary | ICD-10-CM | POA: Insufficient documentation

## 2013-10-28 ENCOUNTER — Other Ambulatory Visit: Payer: Self-pay | Admitting: Internal Medicine

## 2013-10-28 NOTE — Telephone Encounter (Signed)
Refill request MM on 10/07/13

## 2013-11-24 ENCOUNTER — Other Ambulatory Visit: Payer: Self-pay | Admitting: *Deleted

## 2013-11-24 NOTE — Telephone Encounter (Signed)
Refill request

## 2013-11-25 MED ORDER — RIZATRIPTAN BENZOATE 10 MG PO TABS: 10.0000 mg | ORAL_TABLET | ORAL | Status: DC | PRN

## 2014-01-15 ENCOUNTER — Other Ambulatory Visit: Payer: Self-pay | Admitting: Internal Medicine

## 2014-01-15 NOTE — Telephone Encounter (Signed)
Refill req Requested Medications     Medication name:  Name from pharmacy:  ibuprofen (ADVIL,MOTRIN) 800 MG tablet  IBUPROFEN 800 MG TABLET 800 MG TAB    Sig: TAKE 1 TABLET BY MOUTH AT ONSET OF MIGRAINE MAY REPEAT IN 6 HOURS AS NEEDED    Dispense: 30 tablet Refills: 1 Start: 01/15/2014  Class: Normal    Requested on: 06/25/2013    Originally ordered on: 12/22/2011 Last refill: 10/23/2013 Order History and Details           Medication name:  Name from pharmacy:  ondansetron (ZOFRAN) 4 MG tablet  ONDANSETRON HCL 4 MG TABLET 4 MG TAB    Sig: TAKE 1 TABLET BY MOUTH EVERY 8 HOURS AS NEEDED FOR NAUSEA    Dispense: 20 tablet Refills: 0 Start: 01/15/2014  Class: Normal    Requested on: 08/28/2013    Originally ordered on: 09/09/2012 Last refill: 08/28/2013 Order History and Details

## 2014-04-19 NOTE — Progress Notes (Signed)
   Subjective:    Patient ID: Alexandra Cabrera, female    DOB: Sep 25, 1965, 48 y.o.   MRN: [id number]  HPI  Last office note: Health Maintenance: U/A neg today. UTD with vaccines. Influenza pending. See scanned sheet. Advised calcium and vitamin D  Migraine Will continue triptan. She wishes to check with pharmacy and if Maxalt is less expensive Ok to change to Maxalt  DJD knees and hands  S/P Hyst BSO/ Hot flushes Continue Estradiol 2 mg. Advised yearly mm  Nephrolithiasis Neg hgb in urine today. Managed by Dr. Estill Dooms    Today's visit   [facility] is here for follow up  .  She reports that her migraines have increased in frequency since she stopped her Topamax.  She had some meds leftover and began taking again she is on 50 mg bid now.  Former dose was 100 mg bid.  No memory issues or dizziness at this point .  Some trouble with word-finding when first restarting Topamax.  She would like to go back on her Topamax for prophylaxis.    She is tolerating her Estrace well and does not note that HT has increased the frequency of her migraines   Review of Systems See HPI    Objective:   Physical Exam  Physical Exam  Nursing note and vitals reviewed.  Constitutional: She is oriented to person, place, and time. She appears well-developed and well-nourished.  HENT:  Head: Normocephalic and atraumatic.  Cardiovascular: Normal rate and regular rhythm. Exam reveals no gallop and no friction rub.  No murmur heard.  Pulmonary/Chest: Breath sounds normal. She has no wheezes. She has no rales.  Neurological: She is alert and oriented to person, place, and time. Grossly nonfocal exam   Skin: Skin is warm and dry.  Psychiatric: She has a normal mood and affect. Her behavior is normal.             Assessment & Plan:  Migraine :  willl try lowest dose for prophylaxis .  Pt will taper up to 75 mb bid.  Follow with me in 5-6 weeks.    Peri-menipausal hot flushes  Ok to continue HT  Will need labs  next visit

## 2014-04-20 ENCOUNTER — Ambulatory Visit (INDEPENDENT_AMBULATORY_CARE_PROVIDER_SITE_OTHER): Payer: 59 | Admitting: Internal Medicine

## 2014-04-20 ENCOUNTER — Encounter: Payer: Self-pay | Admitting: Internal Medicine

## 2014-04-20 ENCOUNTER — Telehealth: Payer: Self-pay

## 2014-04-20 VITALS — BP 126/79 | HR 71 | Temp 98.0°F | Resp 16 | Ht 60.0 in | Wt 132.0 lb

## 2014-04-20 DIAGNOSIS — N951 Menopausal and female climacteric states: Secondary | ICD-10-CM

## 2014-04-20 DIAGNOSIS — G43909 Migraine, unspecified, not intractable, without status migrainosus: Secondary | ICD-10-CM

## 2014-04-20 DIAGNOSIS — Z0000 Encounter for general adult medical examination without abnormal findings: Secondary | ICD-10-CM

## 2014-04-20 DIAGNOSIS — Z23 Encounter for immunization: Secondary | ICD-10-CM

## 2014-04-20 MED ORDER — TOPIRAMATE 50 MG PO TABS: ORAL_TABLET | ORAL | Status: DC

## 2014-04-20 MED ORDER — TOPIRAMATE 25 MG PO TABS: ORAL_TABLET | ORAL | Status: DC

## 2014-04-20 NOTE — Patient Instructions (Signed)
See me 30 mins in 5-6 weeks

## 2014-04-20 NOTE — Telephone Encounter (Signed)
Monica from pharmacy downstairs was calling to confirm prescriptions for topmax, she received 2 prescriptions one for 25 mg and 1 for 50 mg

## 2014-04-20 NOTE — Telephone Encounter (Signed)
I spoke with [facility] about both RX. Alexandra Cabrera is taking both doses.-eh

## 2014-04-21 LAB — TSH: TSH: 1.246 u[IU]/mL (ref 0.350–4.500)

## 2014-04-21 LAB — LIPID PANEL
Cholesterol: 164 mg/dL (ref 0–200)
HDL: 53 mg/dL (ref >39)
LDL CALC: 93 mg/dL (ref 0–99)
TRIGLYCERIDES: 88 mg/dL (ref <150)
Total CHOL/HDL Ratio: 3.1 Ratio
VLDL: 18 mg/dL (ref 0–40)

## 2014-04-21 LAB — COMPLETE METABOLIC PANEL WITH GFR
ALT: 20 U/L (ref 0–35)
AST: 20 U/L (ref 0–37)
Albumin: 3.9 g/dL (ref 3.5–5.2)
Alkaline Phosphatase: 52 U/L (ref 39–117)
BILIRUBIN TOTAL: 0.4 mg/dL (ref 0.2–1.2)
BUN: 15 mg/dL (ref 6–23)
CO2: 24 mEq/L (ref 19–32)
CREATININE: 0.74 mg/dL (ref 0.50–1.10)
Calcium: 9.1 mg/dL (ref 8.4–10.5)
Chloride: 108 mEq/L (ref 96–112)
GFR, Est African American: >89 mL/min
GFR, Est Non African American: >89 mL/min
Glucose, Bld: 80 mg/dL (ref 70–99)
Potassium: 4 mEq/L (ref 3.5–5.3)
Sodium: 139 mEq/L (ref 135–145)
Total Protein: 5.8 g/dL — ABNORMAL LOW (ref 6.0–8.3)

## 2014-04-21 LAB — CBC WITH DIFFERENTIAL/PLATELET
BASOS ABS: 0 10*3/uL (ref 0.0–0.1)
Basophils Relative: 1 % (ref 0–1)
EOS ABS: 0 10*3/uL (ref 0.0–0.7)
EOS PCT: 1 % (ref 0–5)
HCT: 42 % (ref 36.0–46.0)
Hemoglobin: 13.9 g/dL (ref 12.0–15.0)
LYMPHS PCT: 20 % (ref 12–46)
Lymphs Abs: 0.9 10*3/uL (ref 0.7–4.0)
MCH: 31 pg (ref 26.0–34.0)
MCHC: 33.1 g/dL (ref 30.0–36.0)
MCV: 93.8 fL (ref 78.0–100.0)
Monocytes Absolute: 0.3 10*3/uL (ref 0.1–1.0)
Monocytes Relative: 7 % (ref 3–12)
Neutro Abs: 3.3 10*3/uL (ref 1.7–7.7)
Neutrophils Relative %: 71 % (ref 43–77)
PLATELETS: 250 10*3/uL (ref 150–400)
RBC: 4.48 MIL/uL (ref 3.87–5.11)
RDW: 13.2 % (ref 11.5–15.5)
WBC: 4.6 10*3/uL (ref 4.0–10.5)

## 2014-04-21 LAB — VITAMIN D 25 HYDROXY (VIT D DEFICIENCY, FRACTURES): Vit D, 25-Hydroxy: 42 ng/mL (ref 30–89)

## 2014-05-01 ENCOUNTER — Encounter: Payer: Self-pay | Admitting: Internal Medicine

## 2014-05-21 ENCOUNTER — Telehealth: Payer: Self-pay

## 2014-05-21 NOTE — Telephone Encounter (Signed)
Alexandra Cabrera [phone] (W)  [name] would like to come up around 3:30 and get a copy of her flu shot for HR and a copy of her labs

## 2014-06-16 ENCOUNTER — Other Ambulatory Visit: Payer: Self-pay | Admitting: Internal Medicine

## 2014-06-16 NOTE — Telephone Encounter (Signed)
Refill request

## 2014-07-06 ENCOUNTER — Other Ambulatory Visit: Payer: Self-pay | Admitting: *Deleted

## 2014-07-06 NOTE — Telephone Encounter (Signed)
Refill request

## 2014-07-07 ENCOUNTER — Other Ambulatory Visit: Payer: Self-pay

## 2014-07-07 MED ORDER — TOPIRAMATE 25 MG PO TABS: ORAL_TABLET | ORAL | Status: DC

## 2014-07-07 MED ORDER — SUMATRIPTAN SUCCINATE 50 MG PO TABS: ORAL_TABLET | ORAL | Status: DC

## 2014-07-07 NOTE — Telephone Encounter (Signed)
[place] called to say she needs a refill on her Topamax, she stated that it is really working great, that she is very pleased with it. She also ask if it come in a 75mg  instead of her taking 1- 25 mg and 1- 50 mg BID

## 2014-07-07 NOTE — Telephone Encounter (Signed)
Refill request Alexandra Cabrera called to say she needs a refill on her Topamax, she stated that it is really working great, that she is very pleased with it. She also ask if it come in a 75mg  instead of her taking 1- 25 mg and 1- 50 mg BID

## 2014-07-08 ENCOUNTER — Other Ambulatory Visit: Payer: Self-pay

## 2014-07-08 MED ORDER — TOPIRAMATE 50 MG PO TABS: ORAL_TABLET | ORAL | Status: DC

## 2014-07-08 NOTE — Telephone Encounter (Signed)
[address] [place] [phone] ([place] called and she did not receive a refill on her topiramate (TOPAMAX) 50 MG tablet, she got the Topamax 25 mg but not the 50 mg.

## 2014-09-14 ENCOUNTER — Ambulatory Visit (HOSPITAL_BASED_OUTPATIENT_CLINIC_OR_DEPARTMENT_OTHER)
Admission: RE | Admit: 2014-09-14 | Discharge: 2014-09-14 | Disposition: A | Discharge status: Home / Self Care | Payer: 59 | Source: Ambulatory Visit | Attending: Physician Assistant | Admitting: Physician Assistant

## 2014-09-14 ENCOUNTER — Other Ambulatory Visit: Payer: Self-pay | Admitting: Internal Medicine

## 2014-09-14 ENCOUNTER — Other Ambulatory Visit (HOSPITAL_BASED_OUTPATIENT_CLINIC_OR_DEPARTMENT_OTHER): Payer: Self-pay | Admitting: Physician Assistant

## 2014-09-14 ENCOUNTER — Other Ambulatory Visit (HOSPITAL_BASED_OUTPATIENT_CLINIC_OR_DEPARTMENT_OTHER): Payer: 59

## 2014-09-14 DIAGNOSIS — R1011 Right upper quadrant pain: Secondary | ICD-10-CM

## 2014-09-14 DIAGNOSIS — R932 Abnormal findings on diagnostic imaging of liver and biliary tract: Secondary | ICD-10-CM | POA: Insufficient documentation

## 2014-09-14 DIAGNOSIS — N200 Calculus of kidney: Secondary | ICD-10-CM | POA: Insufficient documentation

## 2014-09-14 DIAGNOSIS — R938 Abnormal findings on diagnostic imaging of other specified body structures: Secondary | ICD-10-CM | POA: Diagnosis not present

## 2014-09-14 LAB — COMPREHENSIVE METABOLIC PANEL
ALT: 13 U/L (ref 0–35)
AST: 16 U/L (ref 0–37)
Albumin: 4.1 g/dL (ref 3.5–5.2)
Alkaline Phosphatase: 59 U/L (ref 39–117)
BUN: 15 mg/dL (ref 6–23)
CALCIUM: 8.9 mg/dL (ref 8.4–10.5)
CO2: 21 meq/L (ref 19–32)
Chloride: 108 mEq/L (ref 96–112)
Creat: 0.89 mg/dL (ref 0.50–1.10)
Glucose, Bld: 86 mg/dL (ref 70–99)
Potassium: 3.9 mEq/L (ref 3.5–5.3)
SODIUM: 140 meq/L (ref 135–145)
TOTAL PROTEIN: 6.2 g/dL (ref 6.0–8.3)
Total Bilirubin: 0.3 mg/dL (ref 0.2–1.2)

## 2014-09-14 LAB — CBC WITH DIFFERENTIAL/PLATELET
Basophils Absolute: 0 10*3/uL (ref 0.0–0.1)
Basophils Relative: 0 % (ref 0–1)
EOS PCT: 2 % (ref 0–5)
Eosinophils Absolute: 0.2 10*3/uL (ref 0.0–0.7)
HEMATOCRIT: 41.3 % (ref 36.0–46.0)
HEMOGLOBIN: 13.9 g/dL (ref 12.0–15.0)
LYMPHS ABS: 1.5 10*3/uL (ref 0.7–4.0)
Lymphocytes Relative: 19 % (ref 12–46)
MCH: 31.4 pg (ref 26.0–34.0)
MCHC: 33.7 g/dL (ref 30.0–36.0)
MCV: 93.4 fL (ref 78.0–100.0)
MPV: 10.8 fL (ref 8.6–12.4)
Monocytes Absolute: 0.4 10*3/uL (ref 0.1–1.0)
Monocytes Relative: 5 % (ref 3–12)
Neutro Abs: 5.7 10*3/uL (ref 1.7–7.7)
Neutrophils Relative %: 74 % (ref 43–77)
Platelets: 261 10*3/uL (ref 150–400)
RBC: 4.42 MIL/uL (ref 3.87–5.11)
RDW: 13 % (ref 11.5–15.5)
WBC: 7.7 10*3/uL (ref 4.0–10.5)

## 2014-09-15 ENCOUNTER — Telehealth: Payer: Self-pay | Admitting: Internal Medicine

## 2014-09-15 ENCOUNTER — Telehealth: Payer: Self-pay | Admitting: *Deleted

## 2014-09-15 NOTE — Telephone Encounter (Signed)
Spoke with pt while she was working in [place]  and [place] .  PT reports nausea over last several days with RUQ pain radiating to back and shoulder.  She has history of GB "sludge" done with a HIDA Scan per her report years ago.  States she was using lots of Zofran over week-end.   I advised her to be seen as a pt in ER.

## 2014-09-15 NOTE — Telephone Encounter (Signed)
Spoke with pt and informed of U/S results.  She is feeling less pain now.  Will set up referral to surgery  Pt advised if any return of nausea, vomiting, fever or upper abd pain to go to ER.  She voices understanding  Margaretha Sheffield has made appt with Dr. Hulen Skains

## 2014-09-15 NOTE — Telephone Encounter (Signed)
Alexandra Cabrera has an appointment with Dr. Hulen Skains on 10/04/14 @ 10:40. Patient needs to check in at 10:10

## 2014-10-06 ENCOUNTER — Ambulatory Visit: Payer: Self-pay | Admitting: General Surgery

## 2014-10-09 ENCOUNTER — Encounter (HOSPITAL_COMMUNITY): Payer: Self-pay | Admitting: *Deleted

## 2014-10-09 NOTE — Progress Notes (Signed)
Anesthesia Chart Review: SAME DAY WORK-UP.  Patient is a 49 year old female scheduled for laparoscopic cholecystectomy on 10/12/14 by Dr. Hulen Skains.  Case is currently scheduled for 1024 (3rd case in Room 9).    History includes non-smoker, migraines, post-operative NV, TKA, hysterectomy, appendectomy, "difficult to wake up" after anesthesia. FAMILY HISTORY (SISTER) OF MALIGNANT HYPERTHEMIA.  She denied personal MH reaction, but has never been tested. TKA was done at [facility]) in [place] in 2003 and appendectomy and hysterectomy were done at [facility]). Anesthesia records from 06/17/02 ([facility]) indicated that sevoflurane, propofol, succinylcholine, fentanyl, midazolam, robinul, X____ (xylocaine?). Anesthesia records from [facility] are still pending.   Reviewed family history of MH and currently available 2003 anesthesia record with anesthesiologist Dr. Tobias Alexander. Anesthesia records placed on her chart for review as needed.  Trigger agents used and no apparent complication. Definitive anesthesia plan per her assigned anesthesiologist.  Myra Gianotti, PA-C [facility] Short Stay Center/Anesthesiology Phone [phone] 10/09/2014 4:21 PM

## 2014-10-09 NOTE — Progress Notes (Signed)
Pt denies SOB, chest pain, and being under the care of a cardiologist. Pt denies having a stress test, echo, and cardiac cath. Pt denies having a chest x ray and EKG within the last year. Pt chart forwarded to [place], [place] ( anesthesia) to review family history of malignant hyperthermia.

## 2014-10-11 MED ORDER — DEXTROSE 5 % IV SOLN
2.0000 g | INTRAVENOUS | Status: AC
  Administered 2014-10-12: 2 g via INTRAVENOUS
  Filled 2014-10-11: qty 2

## 2014-10-11 MED ORDER — FLEET ENEMA 7-19 GM/118ML RE ENEM: 1.0000 | ENEMA | Freq: Once | RECTAL | Status: DC

## 2014-10-12 ENCOUNTER — Ambulatory Visit (HOSPITAL_COMMUNITY): Payer: 59 | Admitting: Vascular Surgery

## 2014-10-12 ENCOUNTER — Encounter (HOSPITAL_COMMUNITY)
Admission: RE | Disposition: A | Discharge status: Home / Self Care | Payer: Self-pay | Source: Ambulatory Visit | Attending: General Surgery

## 2014-10-12 ENCOUNTER — Ambulatory Visit (HOSPITAL_COMMUNITY)
Admission: RE | Admit: 2014-10-12 | Discharge: 2014-10-12 | Disposition: A | Discharge status: Home / Self Care | Payer: 59 | Source: Ambulatory Visit | Attending: General Surgery | Admitting: General Surgery

## 2014-10-12 ENCOUNTER — Other Ambulatory Visit: Payer: Self-pay

## 2014-10-12 ENCOUNTER — Ambulatory Visit (HOSPITAL_COMMUNITY): Payer: 59

## 2014-10-12 DIAGNOSIS — Z9049 Acquired absence of other specified parts of digestive tract: Secondary | ICD-10-CM | POA: Insufficient documentation

## 2014-10-12 DIAGNOSIS — Z87442 Personal history of urinary calculi: Secondary | ICD-10-CM | POA: Diagnosis not present

## 2014-10-12 DIAGNOSIS — G43909 Migraine, unspecified, not intractable, without status migrainosus: Secondary | ICD-10-CM | POA: Insufficient documentation

## 2014-10-12 DIAGNOSIS — Z90710 Acquired absence of both cervix and uterus: Secondary | ICD-10-CM | POA: Insufficient documentation

## 2014-10-12 DIAGNOSIS — Z01818 Encounter for other preprocedural examination: Secondary | ICD-10-CM

## 2014-10-12 DIAGNOSIS — K8020 Calculus of gallbladder without cholecystitis without obstruction: Secondary | ICD-10-CM | POA: Insufficient documentation

## 2014-10-12 HISTORY — PX: CHOLECYSTECTOMY: SHX55

## 2014-10-12 HISTORY — DX: Family history of other specified conditions: Z84.89

## 2014-10-12 HISTORY — DX: Other specified postprocedural states: Z98.890

## 2014-10-12 HISTORY — DX: Nausea with vomiting, unspecified: R11.2

## 2014-10-12 LAB — COMPREHENSIVE METABOLIC PANEL
ALBUMIN: 3.7 g/dL (ref 3.5–5.2)
ALK PHOS: 58 U/L (ref 39–117)
ALT: 17 U/L (ref 0–35)
AST: 21 U/L (ref 0–37)
Anion gap: 6 (ref 5–15)
BUN: 12 mg/dL (ref 6–23)
CO2: 25 mmol/L (ref 19–32)
Calcium: 9.2 mg/dL (ref 8.4–10.5)
Chloride: 111 mmol/L (ref 96–112)
Creatinine, Ser: 0.82 mg/dL (ref 0.50–1.10)
GFR calc Af Amer: >90 mL/min (ref >90)
GFR calc non Af Amer: 83 mL/min — ABNORMAL LOW (ref >90)
GLUCOSE: 96 mg/dL (ref 70–99)
Potassium: 3.9 mmol/L (ref 3.5–5.1)
Sodium: 142 mmol/L (ref 135–145)
TOTAL PROTEIN: 6.1 g/dL (ref 6.0–8.3)
Total Bilirubin: 0.4 mg/dL (ref 0.3–1.2)

## 2014-10-12 LAB — CBC WITH DIFFERENTIAL/PLATELET
BASOS ABS: 0 10*3/uL (ref 0.0–0.1)
BASOS PCT: 0 % (ref 0–1)
EOS PCT: 3 % (ref 0–5)
Eosinophils Absolute: 0.1 10*3/uL (ref 0.0–0.7)
HCT: 40.3 % (ref 36.0–46.0)
HEMOGLOBIN: 13.6 g/dL (ref 12.0–15.0)
LYMPHS ABS: 1.3 10*3/uL (ref 0.7–4.0)
LYMPHS PCT: 24 % (ref 12–46)
MCH: 31.5 pg (ref 26.0–34.0)
MCHC: 33.7 g/dL (ref 30.0–36.0)
MCV: 93.3 fL (ref 78.0–100.0)
MONO ABS: 0.3 10*3/uL (ref 0.1–1.0)
Monocytes Relative: 6 % (ref 3–12)
NEUTROS ABS: 3.6 10*3/uL (ref 1.7–7.7)
Neutrophils Relative %: 67 % (ref 43–77)
PLATELETS: 230 10*3/uL (ref 150–400)
RBC: 4.32 MIL/uL (ref 3.87–5.11)
RDW: 12.6 % (ref 11.5–15.5)
WBC: 5.4 10*3/uL (ref 4.0–10.5)

## 2014-10-12 SURGERY — LAPAROSCOPIC CHOLECYSTECTOMY WITH INTRAOPERATIVE CHOLANGIOGRAM
Anesthesia: General | Site: Abdomen

## 2014-10-12 MED ORDER — HYDROMORPHONE HCL 1 MG/ML IJ SOLN
INTRAMUSCULAR | Status: AC
  Filled 2014-10-12: qty 1

## 2014-10-12 MED ORDER — HYDROMORPHONE HCL 1 MG/ML IJ SOLN
0.5000 mg | INTRAMUSCULAR | Status: DC | PRN
  Administered 2014-10-12 (×3): 0.5 mg via INTRAVENOUS

## 2014-10-12 MED ORDER — PROMETHAZINE HCL 25 MG/ML IJ SOLN
6.2500 mg | INTRAMUSCULAR | Status: DC | PRN
  Administered 2014-10-12: 6.25 mg via INTRAVENOUS

## 2014-10-12 MED ORDER — BUPIVACAINE-EPINEPHRINE (PF) 0.25% -1:200000 IJ SOLN
INTRAMUSCULAR | Status: AC
  Filled 2014-10-12: qty 30

## 2014-10-12 MED ORDER — SODIUM CHLORIDE 0.9 % IV SOLN
INTRAVENOUS | Status: DC | PRN
  Administered 2014-10-12: 5 mL

## 2014-10-12 MED ORDER — MIDAZOLAM HCL 2 MG/2ML IJ SOLN
INTRAMUSCULAR | Status: AC
  Filled 2014-10-12: qty 2

## 2014-10-12 MED ORDER — ONDANSETRON HCL 4 MG/2ML IJ SOLN
INTRAMUSCULAR | Status: DC | PRN
  Administered 2014-10-12: 4 mg via INTRAVENOUS

## 2014-10-12 MED ORDER — BUPIVACAINE-EPINEPHRINE 0.25% -1:200000 IJ SOLN
INTRAMUSCULAR | Status: DC | PRN
  Administered 2014-10-12: 20 mL

## 2014-10-12 MED ORDER — TRAMADOL HCL 50 MG PO TABS: 50.0000 mg | ORAL_TABLET | Freq: Four times a day (QID) | ORAL | Status: DC | PRN

## 2014-10-12 MED ORDER — SODIUM CHLORIDE 0.9 % IR SOLN
Status: DC | PRN
  Administered 2014-10-12: 1000 mL

## 2014-10-12 MED ORDER — PROMETHAZINE HCL 25 MG/ML IJ SOLN
INTRAMUSCULAR | Status: AC
  Administered 2014-10-12: 6.25 mg via INTRAVENOUS
  Filled 2014-10-12: qty 1

## 2014-10-12 MED ORDER — FENTANYL CITRATE 0.05 MG/ML IJ SOLN
INTRAMUSCULAR | Status: AC
  Filled 2014-10-12: qty 2

## 2014-10-12 MED ORDER — FENTANYL CITRATE 0.05 MG/ML IJ SOLN
INTRAMUSCULAR | Status: DC | PRN
  Administered 2014-10-12 (×2): 25 ug via INTRAVENOUS
  Administered 2014-10-12: 50 ug via INTRAVENOUS

## 2014-10-12 MED ORDER — FENTANYL CITRATE 0.05 MG/ML IJ SOLN
25.0000 ug | INTRAMUSCULAR | Status: DC | PRN
  Administered 2014-10-12 (×2): 25 ug via INTRAVENOUS
  Administered 2014-10-12: 50 ug via INTRAVENOUS

## 2014-10-12 MED ORDER — PROPOFOL 10 MG/ML IV BOLUS
INTRAVENOUS | Status: DC | PRN
  Administered 2014-10-12: 110 mg via INTRAVENOUS

## 2014-10-12 MED ORDER — ACETAMINOPHEN 10 MG/ML IV SOLN
INTRAVENOUS | Status: AC
  Administered 2014-10-12: 1000 mg via INTRAVENOUS
  Filled 2014-10-12: qty 100

## 2014-10-12 MED ORDER — GLYCOPYRROLATE 0.2 MG/ML IJ SOLN
INTRAMUSCULAR | Status: DC | PRN
  Administered 2014-10-12: .2 mg via INTRAVENOUS

## 2014-10-12 MED ORDER — LIDOCAINE HCL (CARDIAC) 20 MG/ML IV SOLN
INTRAVENOUS | Status: DC | PRN
  Administered 2014-10-12: 100 mg via INTRAVENOUS

## 2014-10-12 MED ORDER — ROCURONIUM BROMIDE 100 MG/10ML IV SOLN
INTRAVENOUS | Status: DC | PRN
  Administered 2014-10-12: 35 mg via INTRAVENOUS

## 2014-10-12 MED ORDER — FENTANYL CITRATE 0.05 MG/ML IJ SOLN
INTRAMUSCULAR | Status: AC
  Filled 2014-10-12: qty 5

## 2014-10-12 MED ORDER — 0.9 % SODIUM CHLORIDE (POUR BTL) OPTIME
TOPICAL | Status: DC | PRN
  Administered 2014-10-12: 1000 mL

## 2014-10-12 MED ORDER — LACTATED RINGERS IV SOLN
INTRAVENOUS | Status: DC
  Administered 2014-10-12: 50 mL/h via INTRAVENOUS
  Administered 2014-10-12: 12:00:00 via INTRAVENOUS

## 2014-10-12 SURGICAL SUPPLY — 40 items
APPLIER CLIP 5 13 M/L LIGAMAX5 (MISCELLANEOUS) ×2
BLADE SURG ROTATE 9660 (MISCELLANEOUS) ×2 IMPLANT
CANISTER SUCTION 2500CC (MISCELLANEOUS) ×2 IMPLANT
CHLORAPREP W/TINT 26ML (MISCELLANEOUS) ×2 IMPLANT
CLIP APPLIE 5 13 M/L LIGAMAX5 (MISCELLANEOUS) ×1 IMPLANT
COVER MAYO STAND STRL (DRAPES) ×2 IMPLANT
COVER SURGICAL LIGHT HANDLE (MISCELLANEOUS) ×2 IMPLANT
DRAPE C-ARM 42X72 X-RAY (DRAPES) ×2 IMPLANT
DRAPE LAPAROSCOPIC ABDOMINAL (DRAPES) ×2 IMPLANT
DRSG TEGADERM 2-3/8X2-3/4 SM (GAUZE/BANDAGES/DRESSINGS) ×8 IMPLANT
ELECT REM PT RETURN 9FT ADLT (ELECTROSURGICAL) ×2
ELECTRODE REM PT RTRN 9FT ADLT (ELECTROSURGICAL) ×1 IMPLANT
GLOVE BIOGEL PI IND STRL 7.0 (GLOVE) ×2 IMPLANT
GLOVE BIOGEL PI IND STRL 8 (GLOVE) IMPLANT
GLOVE BIOGEL PI INDICATOR 7.0 (GLOVE) ×2
GLOVE BIOGEL PI INDICATOR 8 (GLOVE)
GLOVE ECLIPSE 7.5 STRL STRAW (GLOVE) IMPLANT
GLOVE SURG SS PI 6.5 STRL IVOR (GLOVE) ×4 IMPLANT
GLOVE SURG SS PI 7.0 STRL IVOR (GLOVE) ×2 IMPLANT
GLOVE SURG SS PI 7.5 STRL IVOR (GLOVE) ×2 IMPLANT
GOWN STRL REUS W/ TWL LRG LVL3 (GOWN DISPOSABLE) ×3 IMPLANT
GOWN STRL REUS W/TWL LRG LVL3 (GOWN DISPOSABLE) ×3
KIT BASIN OR (CUSTOM PROCEDURE TRAY) ×2 IMPLANT
KIT ROOM TURNOVER OR (KITS) ×2 IMPLANT
LIQUID BAND (GAUZE/BANDAGES/DRESSINGS) ×2 IMPLANT
NS IRRIG 1000ML POUR BTL (IV SOLUTION) ×2 IMPLANT
PAD ARMBOARD 7.5X6 YLW CONV (MISCELLANEOUS) ×2 IMPLANT
POUCH SPECIMEN RETRIEVAL 10MM (ENDOMECHANICALS) IMPLANT
SCISSORS LAP 5X35 DISP (ENDOMECHANICALS) ×2 IMPLANT
SET CHOLANGIOGRAPH 5 50 .035 (SET/KITS/TRAYS/PACK) ×2 IMPLANT
SET IRRIG TUBING LAPAROSCOPIC (IRRIGATION / IRRIGATOR) ×2 IMPLANT
SLEEVE ENDOPATH XCEL 5M (ENDOMECHANICALS) ×4 IMPLANT
SPECIMEN JAR SMALL (MISCELLANEOUS) ×2 IMPLANT
SUT MNCRL AB 4-0 PS2 18 (SUTURE) ×2 IMPLANT
TOWEL OR 17X24 6PK STRL BLUE (TOWEL DISPOSABLE) IMPLANT
TOWEL OR 17X26 10 PK STRL BLUE (TOWEL DISPOSABLE) ×2 IMPLANT
TRAY LAPAROSCOPIC (CUSTOM PROCEDURE TRAY) ×2 IMPLANT
TROCAR XCEL BLUNT TIP 100MML (ENDOMECHANICALS) ×2 IMPLANT
TROCAR XCEL NON-BLD 5MMX100MML (ENDOMECHANICALS) ×2 IMPLANT
TUBING INSUFFLATION (TUBING) ×2 IMPLANT

## 2014-10-12 NOTE — Discharge Instructions (Signed)
Laparoscopic Cholecystectomy, Care After Refer to this sheet in the next few weeks. These instructions provide you with information on caring for yourself after your procedure. Your health care provider may also give you more specific instructions. Your treatment has been planned according to current medical practices, but problems sometimes occur. Call your health care provider if you have any problems or questions after your procedure. WHAT TO EXPECT AFTER THE PROCEDURE After your procedure, it is typical to have the following:  Pain at your incision sites. You will be given pain medicines to control the pain.  Mild nausea or vomiting. This should improve after the first 24 hours.  Bloating and possibly shoulder pain from the gas used during the procedure. This will improve after the first 24 hours. HOME CARE INSTRUCTIONS   Change bandages (dressings) as directed by your health care provider.  Keep the wound dry and clean. You may wash the wound gently with soap and water. Gently blot or dab the area dry.  Do not take baths or use swimming pools or hot tubs for 2 weeks or until your health care provider approves.  Only take over-the-counter or prescription medicines as directed by your health care provider.  Continue your normal diet as directed by your health care provider.  Do not lift anything heavier than 10 pounds (4.5 kg) until your health care provider approves.  Do not play contact sports for 1 week or until your health care provider approves.  Leave dressing intact until seen in clinic, but may shower on top [place] IF:   You have redness, swelling, or increasing pain in the wound.  You notice yellowish-white fluid (pus) coming from the wound.  You have drainage from the wound that lasts longer than 1 day.  You notice a bad smell coming from the wound or dressing.  Your surgical cuts (incisions) break open. SEEK IMMEDIATE MEDICAL CARE IF:   You develop a  rash.  You have difficulty breathing.  You have chest pain.  You have a fever.  You have increasing pain in the shoulders (shoulder strap areas).  You have dizzy episodes or faint while standing.  You have severe abdominal pain.  You feel sick to your stomach (nauseous) or throw up (vomit) and this lasts for more than 1 day. Document Released: 07/17/2005 Document Revised: 05/07/2013 Document Reviewed: 02/26/2013 [facility] Patient Information 2015 [place], [place]. This information is not intended to replace advice given to you by your health care provider. Make sure you discuss any questions you have with your health care provider.

## 2014-10-12 NOTE — Transfer of Care (Signed)
Immediate Anesthesia Transfer of Care Note  Patient: Alexandra Cabrera  Procedure(s) Performed: Procedure(s): LAPAROSCOPIC CHOLECYSTECTOMY WITH INTRAOPERATIVE CHOLANGIOGRAM (N/A)  Patient Location: PACU  Anesthesia Type:General  Level of Consciousness: awake, alert  and oriented  Airway & Oxygen Therapy: Patient Spontanous Breathing and Patient connected to nasal cannula oxygen  Post-op Assessment: Report given to RN and Post -op Vital signs reviewed and stable  Post vital signs: Reviewed and stable  Last Vitals:  Filed Vitals:   10/12/14 1224  BP:   Pulse:   Temp: 36.3 C  Resp:     Complications: No apparent anesthesia complications

## 2014-10-12 NOTE — Anesthesia Preprocedure Evaluation (Signed)
Anesthesia Evaluation  Patient identified by MRN, date of birth, ID band Patient awake    Reviewed: Allergy & Precautions, NPO status , Patient's Chart, lab work & pertinent test results  History of Anesthesia Complications (+) PONV and Family history of anesthesia reaction  Airway Mallampati: I       Dental  (+) Teeth Intact   Pulmonary  breath sounds clear to auscultation        Cardiovascular negative cardio ROS  Rhythm:Regular Rate:Normal     Neuro/Psych  Headaches, negative neurological ROS     GI/Hepatic negative GI ROS, Neg liver ROS,   Endo/Other  negative endocrine ROS  Renal/GU negative Renal ROS     Musculoskeletal negative musculoskeletal ROS (+)   Abdominal   Peds negative pediatric ROS (+)  Hematology negative hematology ROS (+)   Anesthesia Other Findings   Reproductive/Obstetrics                             Anesthesia Physical Anesthesia Plan  ASA: I  Anesthesia Plan: General   Post-op Pain Management:    Induction: Intravenous  Airway Management Planned: Oral ETT  Additional Equipment:   Intra-op Plan:   Post-operative Plan: Extubation in OR  Informed Consent: I have reviewed the patients History and Physical, chart, labs and discussed the procedure including the risks, benefits and alternatives for the proposed anesthesia with the patient or authorized representative who has indicated his/her understanding and acceptance.   Dental advisory given  Plan Discussed with: CRNA and Surgeon  Anesthesia Plan Comments:         Anesthesia Quick Evaluation

## 2014-10-12 NOTE — Interval H&P Note (Signed)
History and Physical Interval Note: No additional problems.   10/12/2014 9:16 AM  Alexandra Cabrera  has presented today for surgery, with the diagnosis of cholelithiasis  The various methods of treatment have been discussed with the patient and family. After consideration of risks, benefits and other options for treatment, the patient has consented to  Procedure(s): LAPAROSCOPIC CHOLECYSTECTOMY WITH INTRAOPERATIVE CHOLANGIOGRAM (N/A) as a surgical intervention .  The patient's history has been reviewed, patient examined, no change in status, stable for surgery.  I have reviewed the patient's chart and labs.  Questions were answered to the patient's satisfaction.     Zane Pellecchia, JAY

## 2014-10-12 NOTE — H&P (Signed)
Cilicia L. Pharo 10/06/2014 10:18 AM Location: [facility] Patient #: [id number] DOB: 08/23/1965 Married / Language: Cleophus Molt / Race: White Female  History of Present Illness Davy Pique Bynum CMA; 10/06/2014 10:19 AM) Patient words: gallstones.  The patient is a 49 year old female    Other Problems Marjean Donna, [place]; 10/06/2014 10:18 AM) General anesthesia - complications Hemorrhoids Kidney Stone Migraine Headache Oophorectomy  Past Surgical History Marjean Donna, CMA; 10/06/2014 10:18 AM) Appendectomy Hemorrhoidectomy Hysterectomy (not due to cancer) - Complete Knee Surgery Bilateral.  Diagnostic Studies History Marjean Donna, CMA; 10/06/2014 10:18 AM) Colonoscopy >10 years ago Mammogram within last year Pap Smear >5 years ago  Medication History Marjean Donna, CMA; 10/06/2014 10:21 AM) Estradiol (2MG  Tablet, Oral) Active. Topiramate (25MG  Tablet, Oral) Active. Topiramate (50MG  Tablet, Oral) Active. Imitrex (50MG  Tablet, Oral) Active. Medications Reconciled  Social History Marjean Donna, CMA; 10/06/2014 10:18 AM) Caffeine use Coffee, Tea. No alcohol use No drug use Tobacco use Never smoker.  Family History Marjean Donna, CMA; 10/06/2014 10:18 AM) Anesthetic complications Sister. Arthritis Brother, Father, Mother, Sister. Breast Cancer Family Members In General. Cervical Cancer Family Members In General. Migraine Headache Sister.  Pregnancy / Birth History Marjean Donna, [place]; 10/06/2014 10:18 AM) Age at menarche 50 years. Age of menopause 13-50 Gravida 0 Para 0  Review of Systems ([place]; 10/06/2014 10:18 AM) General Not Present- Appetite Loss, Chills, Fatigue, Fever, Night Sweats, Weight Gain and Weight Loss. Skin Not Present- Change in Wart/Mole, Dryness, Hives, Jaundice, New Lesions, Non-Healing Wounds, Rash and Ulcer. HEENT Not Present- Earache, Hearing Loss, Hoarseness, Nose Bleed, Oral Ulcers, Ringing in the Ears, Seasonal Allergies,  Sinus Pain, Sore Throat, Visual Disturbances, Wears glasses/contact lenses and Yellow Eyes. Respiratory Not Present- Bloody sputum, Chronic Cough, Difficulty Breathing, Snoring and Wheezing. Breast Not Present- Breast Mass, Breast Pain, Nipple Discharge and Skin Changes. Cardiovascular Not Present- Chest Pain, Difficulty Breathing Lying Down, Leg Cramps, Palpitations, Rapid Heart Rate, Shortness of Breath and Swelling of Extremities. Gastrointestinal Present- Abdominal Pain, Excessive gas, Gets full quickly at meals, Indigestion, Nausea and Vomiting. Not Present- Bloating, Bloody Stool, Change in Bowel Habits, Chronic diarrhea, Constipation, Difficulty Swallowing, Hemorrhoids and Rectal Pain. Female Genitourinary Not Present- Frequency, Nocturia, Painful Urination, Pelvic Pain and Urgency. Musculoskeletal Not Present- Back Pain, Joint Pain, Joint Stiffness, Muscle Pain, Muscle Weakness and Swelling of Extremities. Neurological Not Present- Decreased Memory, Fainting, Headaches, Numbness, Seizures, Tingling, Tremor, Trouble walking and Weakness. Psychiatric Not Present- Anxiety, Bipolar, Change in Sleep Pattern, Depression, Fearful and Frequent crying. Endocrine Not Present- Cold Intolerance, Excessive Hunger, Hair Changes, Heat Intolerance, Hot flashes and New Diabetes. Hematology Not Present- Easy Bruising, Excessive bleeding, Gland problems, HIV and Persistent Infections.   Vitals (Sonya Bynum CMA; 10/06/2014 10:19 AM) 10/06/2014 10:19 AM Weight: 122 lb Height: 60in Body Surface Area: 1.53 m Body Mass Index: 23.83 kg/m Temp.: 97.110F(Temporal)  Pulse: 72 (Regular)  BP: 122/74 (Sitting, Left Arm, Standard)    Physical Exam (Darnice Comrie O. Hulen Skains MD; 10/06/2014 10:55 AM) General General Appearance-Cooperative. Orientation-Oriented X4. Note: Healthy looking, no distress   Head and Neck Head-normocephalic, atraumatic with no lesions or palpable masses.  Chest and Lung  Exam Chest and lung exam reveals -normal excursion with symmetric chest walls, quiet, even and easy respiratory effort with no use of accessory muscles and on auscultation, normal breath sounds, no adventitious sounds and normal vocal resonance.  Abdomen Inspection Inspection of the abdomen reveals - No Visible peristalsis. Palpation/Percussion Tenderness - Epigastrium and Right Upper Quadrant. Auscultation Auscultation of the  abdomen reveals - Bowel sounds normal.  Lymphatic General Lymphatics -Note: Normal cervical examination.     Assessment & Plan Jeneen Rinks O. Tanija Germani MD; 10/06/2014 10:57 AM) SYMPTOMATIC CHOLELITHIASIS (574.20  K80.20) Impression: Patient with symptoms of RUQ, epigastric and mid chest pain associated with nausea and indigestion. Not improved with Tagamet, Pepcid or Maalox. US shows gallstones, but no acute cholecystitis. I believe the patient is having symptoms form her gallstones and should benefit from surgical extraction laparoscopically. Will plan on surgery.   The risks and benefits explained to the patient and she wishes to proceed

## 2014-10-12 NOTE — Anesthesia Postprocedure Evaluation (Signed)
  Anesthesia Post-op Note  Patient: Alexandra Cabrera  Procedure(s) Performed: Procedure(s): LAPAROSCOPIC CHOLECYSTECTOMY WITH INTRAOPERATIVE CHOLANGIOGRAM (N/A)  Patient Location: PACU  Anesthesia Type:General  Level of Consciousness: awake and alert   Airway and Oxygen Therapy: Patient Spontanous Breathing  Post-op Pain: mild  Post-op Assessment: Post-op Vital signs reviewed  Post-op Vital Signs: stable  Last Vitals:  Filed Vitals:   10/12/14 1310  BP: 100/67  Pulse: 52  Temp:   Resp: 7    Complications: No apparent anesthesia complications

## 2014-10-12 NOTE — Op Note (Signed)
OPERATIVE REPORT  DATE OF OPERATION: 10/12/2014  PATIENT:  Alexandra Cabrera  49 y.o. female  PRE-OPERATIVE DIAGNOSIS:  SYMPTOMATIC CHOLELITHIASIS  POST-OPERATIVE DIAGNOSIS:  SYMPTOMATIC CHOLELITHIASIS  PROCEDURE:  Procedure(s): LAPAROSCOPIC CHOLECYSTECTOMY WITH INTRAOPERATIVE CHOLANGIOGRAM  SURGEON:  Surgeon(s): Doreen Salvage, MD  ASSISTANT: None  ANESTHESIA:   general  EBL: <20 ml  BLOOD ADMINISTERED: none  DRAINS: none   SPECIMEN:  Source of Specimen:  Gallbladder and contents  COUNTS CORRECT:  YES  PROCEDURE DETAILS: The patient was taken to the operating room and placed on the table in the supine position.  After an adequate endotracheal anesthetic was administered, the patient was prepped with ChloroPrep, and then draped in the usual manner exposing the entire abdomen laterally, inferiorly and up  to the costal margins.  After a proper timeout was performed including identifying the patient and the procedure to be performed, a infraumbilical 7.5OI midline incision was made using a #15 blade.  This was taken down to the fascia which was then incised with a #15 blade.  The edges of the fascia were tented up with Kocher clamps as the preperitoneal space was penetrated with a Kelly clamp into the peritoneum.  Once this was done, a pursestring suture of 0 Vicryl was passed around the fascial opening.  This was subsequently used to secure the [facility] cannula which was passed into the peritoneal cavity.  Once the [facility] cannula was in place, carbon dioxide gas was insufflated into the peritoneal cavity up to a maximal intra-abdominal pressure of 71mm Hg.The laparoscope, with attached camera and light source, was passed into the peritoneal cavity to visualize the direct insertion of two right upper quadrant 85mm cannulas, and a sup-xiphoid 68mm cannula.  Once all cannulas were in place, the dissection was begun.  Two ratcheted graspers were attached to the dome and infundibulum of the  gallbladder and retracted towards the anterior abdominal wall and the right upper quadrant.  Using cautery attached to a dissecting forceps, the peritoneum overlaying the triangle of Chalot and the hepatoduodenal triangle was dissected away exposing the cystic duct and the cystic artery.  The cystic artery was clipped proximally and distally then transected.  A clip was placed on the gallbladder side of the cystic duct, then a cholecystodochotomy made using the laparoscopic scissors.  Through the cholecystodochotomy a Cook catheter was passed to performed a cholangiogram.  The cholangiogram showed good flow into the duodenum, no intraductal filling defects, good proximal filling, and no ductal dilatation..  Once the cholangiogram was completed, the [facility] catheter was removed, and the distal cystic duct was clipped multiple times then transected.  The gallbladder was then dissected out of the hepatic bed without event.  It was retrieved from the abdomen (using an EndoCatch bag) without event.  Once the gallbladder was removed, the bed was inspected for hemostasis.  Once excellent hemostasis was obtained all gas and fluids were aspirated from above the liver, then the cannulas were removed.  The infraumbilical incision was closed using the pursestring suture which was in place.  0.25% bupivicaine with epinephrine was injected at all sites.  All 84mm or greater cannula sites were close using a running subcuticular stitch of 4-0 Monocryl.  5.86mm cannula sites were closed with Dermabond only.Steri-Strips and Tagaderm were used to complete the dressings at all sites.  At this point all needle, sponge, and instrument counts were correct.The patient was awakened from anesthesia and taken to the PACU in stable condition.   PATIENT DISPOSITION:  PACU -  hemodynamically stable.   Doreen Salvage 3/14/201612:20 PM

## 2014-10-13 ENCOUNTER — Encounter (HOSPITAL_COMMUNITY): Payer: Self-pay | Admitting: General Surgery

## 2014-10-14 ENCOUNTER — Other Ambulatory Visit: Payer: Self-pay | Admitting: Internal Medicine

## 2014-10-19 NOTE — Telephone Encounter (Signed)
Refill request

## 2014-11-04 ENCOUNTER — Ambulatory Visit (INDEPENDENT_AMBULATORY_CARE_PROVIDER_SITE_OTHER): Payer: 59 | Admitting: Internal Medicine

## 2014-11-04 ENCOUNTER — Encounter: Payer: Self-pay | Admitting: Internal Medicine

## 2014-11-04 VITALS — BP 115/84 | HR 61 | Resp 16 | Ht 60.0 in | Wt 118.0 lb

## 2014-11-04 DIAGNOSIS — G43909 Migraine, unspecified, not intractable, without status migrainosus: Secondary | ICD-10-CM | POA: Diagnosis not present

## 2014-11-04 DIAGNOSIS — N951 Menopausal and female climacteric states: Secondary | ICD-10-CM | POA: Diagnosis not present

## 2014-11-04 DIAGNOSIS — Z139 Encounter for screening, unspecified: Secondary | ICD-10-CM

## 2014-11-04 DIAGNOSIS — Z9049 Acquired absence of other specified parts of digestive tract: Secondary | ICD-10-CM

## 2014-11-04 MED ORDER — ONDANSETRON HCL 4 MG PO TABS: 4.0000 mg | ORAL_TABLET | Freq: Three times a day (TID) | ORAL | Status: DC | PRN

## 2014-11-04 MED ORDER — TOPIRAMATE 25 MG PO TABS: ORAL_TABLET | ORAL | Status: DC

## 2014-11-04 MED ORDER — TOPIRAMATE 50 MG PO TABS: 50.0000 mg | ORAL_TABLET | Freq: Two times a day (BID) | ORAL | Status: DC

## 2014-11-04 MED ORDER — SUMATRIPTAN SUCCINATE 50 MG PO TABS: ORAL_TABLET | ORAL | Status: DC

## 2014-11-04 NOTE — Progress Notes (Signed)
Subjective:    Patient ID: Alexandra Cabrera, female    DOB: [birth year], 49 y.o.   MRN: [id number]  HPI  Alexandra Cabrera is here for follow up.  Since I have last seen her she has had laparoscopic cholecystectomy  Doing well no dyspepsia  Very little diarrhea  She is due for a mm,  Tolerating estradiol well,    NO hot flushes  Using 75 mg Topamax for migraine prophylaxis.  Doing well  She is happy that she is losing weight   She has been walking and trying to lose weight.  Allergies  Allergen Reactions  . Latex Anaphylaxis  . Phenergan [Promethazine Hcl] Nausea And Vomiting  . Dilaudid [Hydromorphone Hcl] Other (See Comments)    unknown  . Eggs Or Egg-Derived Products   . Reglan [Metoclopramide] Anxiety   Past Medical History  Diagnosis Date  . Migraines   . PONV (postoperative nausea and vomiting)     "difficult to wake up."  . Family history of adverse reaction to anesthesia     " my sisiter has had Malignant hypertheria twice, I have never had that problem"; (patient had sevo and succinylcholine with no apparent complication on 93/26/71 at [facility])   Past Surgical History  Procedure Laterality Date  . Total knee arthroplasty    . Abdominal hysterectomy    . Appendectomy    . Colonoscopy    . Cholecystectomy N/A 10/12/2014    Procedure: LAPAROSCOPIC CHOLECYSTECTOMY WITH INTRAOPERATIVE CHOLANGIOGRAM;  Surgeon: Doreen Salvage, MD;  Location: [place];  Service: General;  Laterality: N/A;   History   Social History  . Marital Status: Married    Spouse Name: N/A  . Number of Children: N/A  . Years of Education: N/A   Occupational History  . Not on file.   Social History Main Topics  . Smoking status: Never Smoker   . Smokeless tobacco: Never Used  . Alcohol Use: No  . Drug Use: No  . Sexual Activity: Yes    Birth Control/ Protection: Surgical   Other Topics Concern  . Not on file   Social History Narrative   Family History  Problem Relation Age of Onset  .  Diabetes Mother   . Arthritis Father   . Arthritis Sister   . Arthritis Brother   . Cancer Maternal Grandmother 45    ovarian  . Arthritis Paternal Grandmother   . Heart disease Paternal Grandfather    Patient Active Problem List   Diagnosis Date Noted  . S/P hysterectomy 09/09/2012  . Menopause 09/09/2012  . Hot flushes, perimenopausal 09/09/2012  . Family history of breast cancer in female 09/09/2012  . MIGRAINE HEADACHE 09/24/2007  . ENDOMETRIOSIS 09/24/2007  . ARTHRITIS 09/24/2007  . NEPHROLITHIASIS, HX OF 09/24/2007   Current Outpatient Prescriptions on File Prior to Visit  Medication Sig Dispense Refill  . estradiol (ESTRACE) 2 MG tablet TAKE 1 TABLET BY MOUTH DAILY. 30 tablet 5  . ibuprofen (ADVIL,MOTRIN) 800 MG tablet TAKE 1 TABLET BY MOUTH AT ONSET OF MIGRAINE MAY REPEAT IN 6 HOURS AS NEEDED 30 tablet 1  . Multiple Vitamin (MULTIVITAMIN WITH MINERALS) TABS tablet Take 1 tablet by mouth daily.    . ondansetron (ZOFRAN) 4 MG tablet TAKE 1 TABLET BY MOUTH EVERY 8 HOURS AS NEEDED FOR NAUSEA 20 tablet 0  . rizatriptan (MAXALT) 10 MG tablet Take 1 tablet (10 mg total) by mouth as needed for migraine. May repeat in 2 hours if needed 10 tablet 1  .  SUMAtriptan (IMITREX) 50 MG tablet Take one at headache onset.  May repeat in 2 hours one time only (Patient taking differently: Take 25 mg by mouth every 2 (two) hours as needed for migraine. Take one at headache onset.  May repeat in 2 hours one time only) 9 tablet 3  . topiramate (TOPAMAX) 25 MG tablet Take one tablet in am for 2 weeks then one tablet bid (Patient taking differently: Take 25 mg by mouth 2 (two) times daily. Takes with topamax 50mg  twice daily) 60 tablet 3  . topiramate (TOPAMAX) 50 MG tablet TAKE 1 TABLET BY MOUTH TWICE DAILY 60 tablet 2  . traMADol (ULTRAM) 50 MG tablet Take 1 tablet (50 mg total) by mouth every 6 (six) hours as needed. 30 tablet 0   No current facility-administered medications on file prior to visit.        Review of Systems See HPI    Objective:   Physical Exam Physical Exam  Nursing note and vitals reviewed.  Constitutional: She is oriented to person, place, and time. She appears well-developed and well-nourished.  HENT:  Head: Normocephalic and atraumatic.  Cardiovascular: Normal rate and regular rhythm. Exam reveals no gallop and no friction rub.  No murmur heard.  Pulmonary/Chest: Breath sounds normal. She has no wheezes. She has no rales.  Neurological: She is alert and oriented to person, place, and time.  Abd incisions healing well  Skin: Skin is warm and dry.  Psychiatric: She has a normal mood and affect. Her behavior is normal.      Assessment & Plan:  Migraine H/A managed well with topamax and imitrex for abortive tx  Menopausal hot flushes    Will schedule mm OK to continue if mm neg.    S/P recent cholecystectomy

## 2014-11-06 ENCOUNTER — Ambulatory Visit (HOSPITAL_BASED_OUTPATIENT_CLINIC_OR_DEPARTMENT_OTHER): Payer: 59

## 2014-11-06 ENCOUNTER — Ambulatory Visit (HOSPITAL_BASED_OUTPATIENT_CLINIC_OR_DEPARTMENT_OTHER)
Admission: RE | Admit: 2014-11-06 | Discharge: 2014-11-06 | Disposition: A | Discharge status: Home / Self Care | Payer: 59 | Source: Ambulatory Visit | Attending: Internal Medicine | Admitting: Internal Medicine

## 2014-11-06 DIAGNOSIS — Z1231 Encounter for screening mammogram for malignant neoplasm of breast: Secondary | ICD-10-CM | POA: Insufficient documentation

## 2014-11-13 ENCOUNTER — Telehealth: Payer: Self-pay | Admitting: *Deleted

## 2014-11-13 NOTE — Telephone Encounter (Signed)
Unable to reach patient at time of Pre-Visit Call.  Left message for patient to return call when available.    

## 2014-11-16 ENCOUNTER — Ambulatory Visit: Payer: 59 | Admitting: Family Medicine

## 2014-12-07 ENCOUNTER — Other Ambulatory Visit: Payer: Self-pay | Admitting: Internal Medicine

## 2014-12-07 NOTE — Telephone Encounter (Signed)
Refill request

## 2014-12-24 ENCOUNTER — Encounter: Payer: Self-pay | Admitting: *Deleted

## 2014-12-24 ENCOUNTER — Telehealth: Payer: Self-pay | Admitting: *Deleted

## 2014-12-24 NOTE — Telephone Encounter (Signed)
Pre-Visit Call completed with patient and chart updated.   Pre-Visit Info documented in Specialty Comments under [employer].    

## 2014-12-25 ENCOUNTER — Ambulatory Visit (INDEPENDENT_AMBULATORY_CARE_PROVIDER_SITE_OTHER): Payer: 59 | Admitting: Family Medicine

## 2014-12-25 ENCOUNTER — Encounter: Payer: Self-pay | Admitting: Family Medicine

## 2014-12-25 VITALS — BP 100/62 | HR 73 | Temp 98.5°F | Ht 60.0 in | Wt 117.2 lb

## 2014-12-25 DIAGNOSIS — Z0000 Encounter for general adult medical examination without abnormal findings: Secondary | ICD-10-CM | POA: Diagnosis not present

## 2014-12-25 DIAGNOSIS — G43809 Other migraine, not intractable, without status migrainosus: Secondary | ICD-10-CM | POA: Diagnosis not present

## 2014-12-25 DIAGNOSIS — N951 Menopausal and female climacteric states: Secondary | ICD-10-CM | POA: Diagnosis not present

## 2014-12-25 DIAGNOSIS — K589 Irritable bowel syndrome without diarrhea: Secondary | ICD-10-CM

## 2014-12-25 NOTE — Patient Instructions (Signed)
[employer] or [facility] Colon Health probiotic, NOW company order on line at [email or web address], 10 strain probiotic Vitamin D supplements 2000 IU daily  Preventive Care for Adults A healthy lifestyle and preventive care can promote health and wellness. Preventive health guidelines for women include the following key practices.  A routine yearly physical is a good way to check with your health care provider about your health and preventive screening. It is a chance to share any concerns and updates on your health and to receive a thorough exam.  Visit your dentist for a routine exam and preventive care every 6 months. Brush your teeth twice a day and floss once a day. Good oral hygiene prevents tooth decay and gum disease.  The frequency of eye exams is based on your age, health, family medical history, use of contact lenses, and other factors. Follow your health care provider's recommendations for frequency of eye exams.  Eat a healthy diet. Foods like vegetables, fruits, whole grains, low-fat dairy products, and lean protein foods contain the nutrients you need without too many calories. Decrease your intake of foods high in solid fats, added sugars, and salt. Eat the right amount of calories for you.Get information about a proper diet from your health care provider, if necessary.  Regular physical exercise is one of the most important things you can do for your health. Most adults should get at least 150 minutes of moderate-intensity exercise (any activity that increases your heart rate and causes you to sweat) each week. In addition, most adults need muscle-strengthening exercises on 2 or more days a week.  Maintain a healthy weight. The body mass index (BMI) is a screening tool to identify possible weight problems. It provides an estimate of body fat based on height and weight. Your health care provider can find your BMI and can help you achieve or maintain a healthy weight.For adults  20 years and older:  A BMI below 18.5 is considered underweight.  A BMI of 18.5 to 24.9 is normal.  A BMI of 25 to 29.9 is considered overweight.  A BMI of 30 and above is considered obese.  Maintain normal blood lipids and cholesterol levels by exercising and minimizing your intake of saturated fat. Eat a balanced diet with plenty of fruit and vegetables. Blood tests for lipids and cholesterol should begin at age 23 and be repeated every 5 years. If your lipid or cholesterol levels are high, you are over 50, or you are at high risk for heart disease, you may need your cholesterol levels checked more frequently.Ongoing high lipid and cholesterol levels should be treated with medicines if diet and exercise are not working.  If you smoke, find out from your health care provider how to quit. If you do not use tobacco, do not start.  Lung cancer screening is recommended for adults aged 28-80 years who are at high risk for developing lung cancer because of a history of smoking. A yearly low-dose CT scan of the lungs is recommended for people who have at least a 30-pack-year history of smoking and are a current smoker or have quit within the past 15 years. A pack year of smoking is smoking an average of 1 pack of cigarettes a day for 1 year (for example: 1 pack a day for 30 years or 2 packs a day for 15 years). Yearly screening should continue until the smoker has stopped smoking for at least 15 years. Yearly screening should be stopped for people who develop a  health problem that would prevent them from having lung cancer treatment.  If you are pregnant, do not drink alcohol. If you are breastfeeding, be very cautious about drinking alcohol. If you are not pregnant and choose to drink alcohol, do not have more than 1 drink per day. One drink is considered to be 12 ounces (355 mL) of beer, 5 ounces (148 mL) of wine, or 1.5 ounces (44 mL) of liquor.  Avoid use of street drugs. Do not share needles with  anyone. Ask for help if you need support or instructions about stopping the use of drugs.  High blood pressure causes heart disease and increases the risk of stroke. Your blood pressure should be checked at least every 1 to 2 years. Ongoing high blood pressure should be treated with medicines if weight loss and exercise do not work.  If you are 95-45 years old, ask your health care provider if you should take aspirin to prevent strokes.  Diabetes screening involves taking a blood sample to check your fasting blood sugar level. This should be done once every 3 years, after age 92, if you are within normal weight and without risk factors for diabetes. Testing should be considered at a younger age or be carried out more frequently if you are overweight and have at least 1 risk factor for diabetes.  Breast cancer screening is essential preventive care for women. You should practice "breast self-awareness." This means understanding the normal appearance and feel of your breasts and may include breast self-examination. Any changes detected, no matter how small, should be reported to a health care provider. Women in their 42s and 30s should have a clinical breast exam (CBE) by a health care provider as part of a regular health exam every 1 to 3 years. After age 41, women should have a CBE every year. Starting at age 3, women should consider having a mammogram (breast X-ray test) every year. Women who have a family history of breast cancer should talk to their health care provider about genetic screening. Women at a high risk of breast cancer should talk to their health care providers about having an MRI and a mammogram every year.  Breast cancer gene (BRCA)-related cancer risk assessment is recommended for women who have family members with BRCA-related cancers. BRCA-related cancers include breast, ovarian, tubal, and peritoneal cancers. Having family members with these cancers may be associated with an  increased risk for harmful changes (mutations) in the breast cancer genes BRCA1 and BRCA2. Results of the assessment will determine the need for genetic counseling and BRCA1 and BRCA2 testing.  Routine pelvic exams to screen for cancer are no longer recommended for nonpregnant women who are considered low risk for cancer of the pelvic organs (ovaries, uterus, and vagina) and who do not have symptoms. Ask your health care provider if a screening pelvic exam is right for you.  If you have had past treatment for cervical cancer or a condition that could lead to cancer, you need Pap tests and screening for cancer for at least 20 years after your treatment. If Pap tests have been discontinued, your risk factors (such as having a new sexual partner) need to be reassessed to determine if screening should be resumed. Some women have medical problems that increase the chance of getting cervical cancer. In these cases, your health care provider may recommend more frequent screening and Pap tests.  The HPV test is an additional test that may be used for cervical cancer screening. The  HPV test looks for the virus that can cause the cell changes on the cervix. The cells collected during the Pap test can be tested for HPV. The HPV test could be used to screen women aged 40 years and older, and should be used in women of any age who have unclear Pap test results. After the age of 65, women should have HPV testing at the same frequency as a Pap test.  Colorectal cancer can be detected and often prevented. Most routine colorectal cancer screening begins at the age of 68 years and continues through age 45 years. However, your health care provider may recommend screening at an earlier age if you have risk factors for colon cancer. On a yearly basis, your health care provider may provide home test kits to check for hidden blood in the stool. Use of a small camera at the end of a tube, to directly examine the colon  (sigmoidoscopy or colonoscopy), can detect the earliest forms of colorectal cancer. Talk to your health care provider about this at age 31, when routine screening begins. Direct exam of the colon should be repeated every 5-10 years through age 74 years, unless early forms of pre-cancerous polyps or small growths are found.  People who are at an increased risk for hepatitis B should be screened for this virus. You are considered at high risk for hepatitis B if:  You were born in a country where hepatitis B occurs often. Talk with your health care provider about which countries are considered high risk.  Your parents were born in a high-risk country and you have not received a shot to protect against hepatitis B (hepatitis B vaccine).  You have HIV or AIDS.  You use needles to inject street drugs.  You live with, or have sex with, someone who has hepatitis B.  You get hemodialysis treatment.  You take certain medicines for conditions like cancer, organ transplantation, and autoimmune conditions.  Hepatitis C blood testing is recommended for all people born from [birth year] through 1965 and any individual with known risks for hepatitis C.  Practice safe sex. Use condoms and avoid high-risk sexual practices to reduce the spread of sexually transmitted infections (STIs). STIs include gonorrhea, chlamydia, syphilis, trichomonas, herpes, HPV, and human immunodeficiency virus (HIV). Herpes, HIV, and HPV are viral illnesses that have no cure. They can result in disability, cancer, and death.  You should be screened for sexually transmitted illnesses (STIs) including gonorrhea and chlamydia if:  You are sexually active and are younger than 24 years.  You are older than 24 years and your health care provider tells you that you are at risk for this type of infection.  Your sexual activity has changed since you were last screened and you are at an increased risk for chlamydia or gonorrhea. Ask your health  care provider if you are at risk.  If you are at risk of being infected with HIV, it is recommended that you take a prescription medicine daily to prevent HIV infection. This is called preexposure prophylaxis (PrEP). You are considered at risk if:  You are a heterosexual woman, are sexually active, and are at increased risk for HIV infection.  You take drugs by injection.  You are sexually active with a partner who has HIV.  Talk with your health care provider about whether you are at high risk of being infected with HIV. If you choose to begin PrEP, you should first be tested for HIV. You should then be tested  every 3 months for as long as you are taking PrEP.  Osteoporosis is a disease in which the bones lose minerals and strength with aging. This can result in serious bone fractures or breaks. The risk of osteoporosis can be identified using a bone density scan. Women ages 20 years and over and women at risk for fractures or osteoporosis should discuss screening with their health care providers. Ask your health care provider whether you should take a calcium supplement or vitamin D to reduce the rate of osteoporosis.  Menopause can be associated with physical symptoms and risks. Hormone replacement therapy is available to decrease symptoms and risks. You should talk to your health care provider about whether hormone replacement therapy is right for you.  Use sunscreen. Apply sunscreen liberally and repeatedly throughout the day. You should seek shade when your shadow is shorter than you. Protect yourself by wearing long sleeves, pants, a wide-brimmed hat, and sunglasses year round, whenever you are outdoors.  Once a month, do a whole body skin exam, using a mirror to look at the skin on your back. Tell your health care provider of new moles, moles that have irregular borders, moles that are larger than a pencil eraser, or moles that have changed in shape or color.  Stay current with required  vaccines (immunizations).  Influenza vaccine. All adults should be immunized every year.  Tetanus, diphtheria, and acellular pertussis (Td, Tdap) vaccine. Pregnant women should receive 1 dose of Tdap vaccine during each pregnancy. The dose should be obtained regardless of the length of time since the last dose. Immunization is preferred during the 27th-36th week of gestation. An adult who has not previously received Tdap or who does not know her vaccine status should receive 1 dose of Tdap. This initial dose should be followed by tetanus and diphtheria toxoids (Td) booster doses every 10 years. Adults with an unknown or incomplete history of completing a 3-dose immunization series with Td-containing vaccines should begin or complete a primary immunization series including a Tdap dose. Adults should receive a Td booster every 10 years.  Varicella vaccine. An adult without evidence of immunity to varicella should receive 2 doses or a second dose if she has previously received 1 dose. Pregnant females who do not have evidence of immunity should receive the first dose after pregnancy. This first dose should be obtained before leaving the health care facility. The second dose should be obtained 4-8 weeks after the first dose.  Human papillomavirus (HPV) vaccine. Females aged 13-26 years who have not received the vaccine previously should obtain the 3-dose series. The vaccine is not recommended for use in pregnant females. However, pregnancy testing is not needed before receiving a dose. If a female is found to be pregnant after receiving a dose, no treatment is needed. In that case, the remaining doses should be delayed until after the pregnancy. Immunization is recommended for any person with an immunocompromised condition through the age of 65 years if she did not get any or all doses earlier. During the 3-dose series, the second dose should be obtained 4-8 weeks after the first dose. The third dose should be  obtained 24 weeks after the first dose and 16 weeks after the second dose.  Zoster vaccine. One dose is recommended for adults aged 76 years or older unless certain conditions are present.  Measles, mumps, and rubella (MMR) vaccine. Adults born before [birth year] generally are considered immune to measles and mumps. Adults born in [birth year] or later should  have 1 or more doses of MMR vaccine unless there is a contraindication to the vaccine or there is laboratory evidence of immunity to each of the three diseases. A routine second dose of MMR vaccine should be obtained at least 28 days after the first dose for students attending postsecondary schools, health care workers, or international travelers. People who received inactivated measles vaccine or an unknown type of measles vaccine during 1963-1967 should receive 2 doses of MMR vaccine. People who received inactivated mumps vaccine or an unknown type of mumps vaccine before 1979 and are at high risk for mumps infection should consider immunization with 2 doses of MMR vaccine. For females of childbearing age, rubella immunity should be determined. If there is no evidence of immunity, females who are not pregnant should be vaccinated. If there is no evidence of immunity, females who are pregnant should delay immunization until after pregnancy. Unvaccinated health care workers born before [birth year] who lack laboratory evidence of measles, mumps, or rubella immunity or laboratory confirmation of disease should consider measles and mumps immunization with 2 doses of MMR vaccine or rubella immunization with 1 dose of MMR vaccine.  Pneumococcal 13-valent conjugate (PCV13) vaccine. When indicated, a person who is uncertain of her immunization history and has no record of immunization should receive the PCV13 vaccine. An adult aged 60 years or older who has certain medical conditions and has not been previously immunized should receive 1 dose of PCV13 vaccine. This PCV13 should be  followed with a dose of pneumococcal polysaccharide (PPSV23) vaccine. The PPSV23 vaccine dose should be obtained at least 8 weeks after the dose of PCV13 vaccine. An adult aged 42 years or older who has certain medical conditions and previously received 1 or more doses of PPSV23 vaccine should receive 1 dose of PCV13. The PCV13 vaccine dose should be obtained 1 or more years after the last PPSV23 vaccine dose.  Pneumococcal polysaccharide (PPSV23) vaccine. When PCV13 is also indicated, PCV13 should be obtained first. All adults aged 81 years and older should be immunized. An adult younger than age 33 years who has certain medical conditions should be immunized. Any person who resides in a nursing home or long-term care facility should be immunized. An adult smoker should be immunized. People with an immunocompromised condition and certain other conditions should receive both PCV13 and PPSV23 vaccines. People with human immunodeficiency virus (HIV) infection should be immunized as soon as possible after diagnosis. Immunization during chemotherapy or radiation therapy should be avoided. Routine use of PPSV23 vaccine is not recommended for American Indians, [place] Natives, or people younger than 65 years unless there are medical conditions that require PPSV23 vaccine. When indicated, people who have unknown immunization and have no record of immunization should receive PPSV23 vaccine. One-time revaccination 5 years after the first dose of PPSV23 is recommended for people aged 19-64 years who have chronic kidney failure, nephrotic syndrome, asplenia, or immunocompromised conditions. People who received 1-2 doses of PPSV23 before age 7 years should receive another dose of PPSV23 vaccine at age 42 years or later if at least 5 years have passed since the previous dose. Doses of PPSV23 are not needed for people immunized with PPSV23 at or after age 58 years.  Meningococcal vaccine. Adults with asplenia or persistent  complement component deficiencies should receive 2 doses of quadrivalent meningococcal conjugate (MenACWY-D) vaccine. The doses should be obtained at least 2 months apart. Microbiologists working with certain meningococcal bacteria, [employer] recruits, people at risk during an outbreak, and  people who travel to or live in countries with a high rate of meningitis should be immunized. A first-year college student up through age 44 years who is living in a residence hall should receive a dose if she did not receive a dose on or after her 16th birthday. Adults who have certain high-risk conditions should receive one or more doses of vaccine.  Hepatitis A vaccine. Adults who wish to be protected from this disease, have certain high-risk conditions, work with hepatitis A-infected animals, work in hepatitis A research labs, or travel to or work in countries with a high rate of hepatitis A should be immunized. Adults who were previously unvaccinated and who anticipate close contact with an international adoptee during the first 60 days after arrival in the [place] from a country with a high rate of hepatitis A should be immunized.  Hepatitis B vaccine. Adults who wish to be protected from this disease, have certain high-risk conditions, may be exposed to blood or other infectious body fluids, are household contacts or sex partners of hepatitis B positive people, are clients or workers in certain care facilities, or travel to or work in countries with a high rate of hepatitis B should be immunized.  Haemophilus influenzae type b (Hib) vaccine. A previously unvaccinated person with asplenia or sickle cell disease or having a scheduled splenectomy should receive 1 dose of Hib vaccine. Regardless of previous immunization, a recipient of a hematopoietic stem cell transplant should receive a 3-dose series 6-12 months after her successful transplant. Hib vaccine is not recommended for adults with HIV  infection. Preventive Services / Frequency Ages 39 to 40 years  Blood pressure check.** / Every 1 to 2 years.  Lipid and cholesterol check.** / Every 5 years beginning at age 17.  Clinical breast exam.** / Every 3 years for women in their 56s and 94s.  BRCA-related cancer risk assessment.** / For women who have family members with a BRCA-related cancer (breast, ovarian, tubal, or peritoneal cancers).  Pap test.** / Every 2 years from ages 64 through 54. Every 3 years starting at age 82 through age 67 or 41 with a history of 3 consecutive normal Pap tests.  HPV screening.** / Every 3 years from ages 72 through ages 18 to 18 with a history of 3 consecutive normal Pap tests.  Hepatitis C blood test.** / For any individual with known risks for hepatitis C.  Skin self-exam. / Monthly.  Influenza vaccine. / Every year.  Tetanus, diphtheria, and acellular pertussis (Tdap, Td) vaccine.** / Consult your health care provider. Pregnant women should receive 1 dose of Tdap vaccine during each pregnancy. 1 dose of Td every 10 years.  Varicella vaccine.** / Consult your health care provider. Pregnant females who do not have evidence of immunity should receive the first dose after pregnancy.  HPV vaccine. / 3 doses over 6 months, if 31 and younger. The vaccine is not recommended for use in pregnant females. However, pregnancy testing is not needed before receiving a dose.  Measles, mumps, rubella (MMR) vaccine.** / You need at least 1 dose of MMR if you were born in 1957 or later. You may also need a 2nd dose. For females of childbearing age, rubella immunity should be determined. If there is no evidence of immunity, females who are not pregnant should be vaccinated. If there is no evidence of immunity, females who are pregnant should delay immunization until after pregnancy.  Pneumococcal 13-valent conjugate (PCV13) vaccine.** / Consult your health care  provider.  Pneumococcal polysaccharide  (PPSV23) vaccine.** / 1 to 2 doses if you smoke cigarettes or if you have certain conditions.  Meningococcal vaccine.** / 1 dose if you are age 87 to 96 years and a Market researcher living in a residence hall, or have one of several medical conditions, you need to get vaccinated against meningococcal disease. You may also need additional booster doses.  Hepatitis A vaccine.** / Consult your health care provider.  Hepatitis B vaccine.** / Consult your health care provider.  Haemophilus influenzae type b (Hib) vaccine.** / Consult your health care provider. Ages 54 to 71 years  Blood pressure check.** / Every 1 to 2 years.  Lipid and cholesterol check.** / Every 5 years beginning at age 29 years.  Lung cancer screening. / Every year if you are aged 62-80 years and have a 30-pack-year history of smoking and currently smoke or have quit within the past 15 years. Yearly screening is stopped once you have quit smoking for at least 15 years or develop a health problem that would prevent you from having lung cancer treatment.  Clinical breast exam.** / Every year after age 16 years.  BRCA-related cancer risk assessment.** / For women who have family members with a BRCA-related cancer (breast, ovarian, tubal, or peritoneal cancers).  Mammogram.** / Every year beginning at age 77 years and continuing for as long as you are in good health. Consult with your health care provider.  Pap test.** / Every 3 years starting at age 69 years through age 29 or 15 years with a history of 3 consecutive normal Pap tests.  HPV screening.** / Every 3 years from ages 2 years through ages 45 to 69 years with a history of 3 consecutive normal Pap tests.  Fecal occult blood test (FOBT) of stool. / Every year beginning at age 72 years and continuing until age 4 years. You may not need to do this test if you get a colonoscopy every 10 years.  Flexible sigmoidoscopy or colonoscopy.** / Every 5 years for a  flexible sigmoidoscopy or every 10 years for a colonoscopy beginning at age 72 years and continuing until age 19 years.  Hepatitis C blood test.** / For all people born from [birth year] through 1966 and any individual with known risks for hepatitis C.  Skin self-exam. / Monthly.  Influenza vaccine. / Every year.  Tetanus, diphtheria, and acellular pertussis (Tdap/Td) vaccine.** / Consult your health care provider. Pregnant women should receive 1 dose of Tdap vaccine during each pregnancy. 1 dose of Td every 10 years.  Varicella vaccine.** / Consult your health care provider. Pregnant females who do not have evidence of immunity should receive the first dose after pregnancy.  Zoster vaccine.** / 1 dose for adults aged 42 years or older.  Measles, mumps, rubella (MMR) vaccine.** / You need at least 1 dose of MMR if you were born in 1958 or later. You may also need a 2nd dose. For females of childbearing age, rubella immunity should be determined. If there is no evidence of immunity, females who are not pregnant should be vaccinated. If there is no evidence of immunity, females who are pregnant should delay immunization until after pregnancy.  Pneumococcal 13-valent conjugate (PCV13) vaccine.** / Consult your health care provider.  Pneumococcal polysaccharide (PPSV23) vaccine.** / 1 to 2 doses if you smoke cigarettes or if you have certain conditions.  Meningococcal vaccine.** / Consult your health care provider.  Hepatitis A vaccine.** / Consult your health care provider.  Hepatitis B vaccine.** / Consult your health care provider.  Haemophilus influenzae type b (Hib) vaccine.** / Consult your health care provider. Ages 16 years and over  Blood pressure check.** / Every 1 to 2 years.  Lipid and cholesterol check.** / Every 5 years beginning at age 66 years.  Lung cancer screening. / Every year if you are aged 60-80 years and have a 30-pack-year history of smoking and currently smoke or have  quit within the past 15 years. Yearly screening is stopped once you have quit smoking for at least 15 years or develop a health problem that would prevent you from having lung cancer treatment.  Clinical breast exam.** / Every year after age 29 years.  BRCA-related cancer risk assessment.** / For women who have family members with a BRCA-related cancer (breast, ovarian, tubal, or peritoneal cancers).  Mammogram.** / Every year beginning at age 50 years and continuing for as long as you are in good health. Consult with your health care provider.  Pap test.** / Every 3 years starting at age 57 years through age 42 or 70 years with 3 consecutive normal Pap tests. Testing can be stopped between 65 and 70 years with 3 consecutive normal Pap tests and no abnormal Pap or HPV tests in the past 10 years.  HPV screening.** / Every 3 years from ages 71 years through ages 84 or 65 years with a history of 3 consecutive normal Pap tests. Testing can be stopped between 65 and 70 years with 3 consecutive normal Pap tests and no abnormal Pap or HPV tests in the past 10 years.  Fecal occult blood test (FOBT) of stool. / Every year beginning at age 47 years and continuing until age 63 years. You may not need to do this test if you get a colonoscopy every 10 years.  Flexible sigmoidoscopy or colonoscopy.** / Every 5 years for a flexible sigmoidoscopy or every 10 years for a colonoscopy beginning at age 26 years and continuing until age 56 years.  Hepatitis C blood test.** / For all people born from [birth year] through 1966 and any individual with known risks for hepatitis C.  Osteoporosis screening.** / A one-time screening for women ages 67 years and over and women at risk for fractures or osteoporosis.  Skin self-exam. / Monthly.  Influenza vaccine. / Every year.  Tetanus, diphtheria, and acellular pertussis (Tdap/Td) vaccine.** / 1 dose of Td every 10 years.  Varicella vaccine.** / Consult your health care  provider.  Zoster vaccine.** / 1 dose for adults aged 57 years or older.  Pneumococcal 13-valent conjugate (PCV13) vaccine.** / Consult your health care provider.  Pneumococcal polysaccharide (PPSV23) vaccine.** / 1 dose for all adults aged 74 years and older.  Meningococcal vaccine.** / Consult your health care provider.  Hepatitis A vaccine.** / Consult your health care provider.  Hepatitis B vaccine.** / Consult your health care provider.  Haemophilus influenzae type b (Hib) vaccine.** / Consult your health care provider. ** Family history and personal history of risk and conditions may change your health care provider's recommendations. Document Released: 09/12/2001 Document Revised: 12/01/2013 Document Reviewed: 12/12/2010 [facility] Patient Information 2015 [place], [place]. This information is not intended to replace advice given to you by your health care provider. Make sure you discuss any questions you have with your health care provider.

## 2014-12-25 NOTE — Progress Notes (Signed)
Pre visit review using our clinic review tool, if applicable. No additional management support is needed unless otherwise documented below in the visit note. 

## 2015-01-03 ENCOUNTER — Encounter: Payer: Self-pay | Admitting: Family Medicine

## 2015-01-03 DIAGNOSIS — Z0000 Encounter for general adult medical examination without abnormal findings: Secondary | ICD-10-CM

## 2015-01-03 DIAGNOSIS — K589 Irritable bowel syndrome without diarrhea: Secondary | ICD-10-CM

## 2015-01-03 HISTORY — DX: Encounter for general adult medical examination without abnormal findings: Z00.00

## 2015-01-03 HISTORY — DX: Irritable bowel syndrome, unspecified: K58.9

## 2015-01-03 NOTE — Progress Notes (Signed)
Alexandra Cabrera  [id number] [birth year] 01/03/2015      Progress Note-Follow Up  Subjective  Chief Complaint  Chief Complaint  Patient presents with  . Establish Care    HPI  Patient is a 49 y.o. female in today for routine medical care. Patient is in today to to establish care. Her previous primary care doctor has left practice. She denies any recent illness. She has no acute concerns but does have a long history that includes irritable bowel syndrome, perimenopause, migraines, endometriosis. Denies CP/palp/SOB/HA/congestion/fevers/GI or GU c/o. Taking meds as prescribed  Past Medical History  Diagnosis Date  . Migraines   . PONV (postoperative nausea and vomiting)     "difficult to wake up."  . Family history of adverse reaction to anesthesia     " my sisiter has had Malignant hypertheria twice, I have never had that problem"; (patient had sevo and succinylcholine with no apparent complication on 57/84/69 at [facility])  . Asthma     childhood and rare as adult  . IBS (irritable bowel syndrome) 01/03/2015    Past Surgical History  Procedure Laterality Date  . Total knee arthroplasty      8 knee replacements, 28 surgeries on right knee  . Abdominal hysterectomy    . Appendectomy    . Cholecystectomy N/A 10/12/2014    Procedure: LAPAROSCOPIC CHOLECYSTECTOMY WITH INTRAOPERATIVE CHOLANGIOGRAM;  Surgeon: Doreen Salvage, MD;  Location: [facility];  Service: General;  Laterality: N/A;  . Hemorroidectomy      young adult    Family History  Problem Relation Age of Onset  . Diabetes Mother   . Heart disease Mother     MI  . Arthritis Father   . COPD Father     smoker  . Hypertension Father   . Hyperlipidemia Father   . Arthritis Sister   . Arthritis Brother   . Cancer Maternal Grandmother 45    ovarian  . Arthritis Paternal Grandmother   . Heart disease Paternal Grandfather     MI    History   Social History  . Marital Status: Married    Spouse Name: N/A  .  Number of Children: N/A  . Years of Education: N/A   Occupational History  . Not on file.   Social History Main Topics  . Smoking status: Never Smoker   . Smokeless tobacco: Never Used  . Alcohol Use: No  . Drug Use: No  . Sexual Activity: Yes    Birth Control/ Protection: Surgical     Comment: lives with husband, works as Marine scientist in [place], no dietawry restrictions   Other Topics Concern  . Not on file   Social History Narrative    Current Outpatient Prescriptions on File Prior to Visit  Medication Sig Dispense Refill  . estradiol (ESTRACE) 2 MG tablet TAKE 1 TABLET BY MOUTH DAILY. 30 tablet 5  . ibuprofen (ADVIL,MOTRIN) 800 MG tablet TAKE 1 TABLET BY MOUTH AT ONSET OF MIGRAINE MAY REPEAT IN 6 HOURS AS NEEDED 30 tablet 1  . Multiple Vitamin (MULTIVITAMIN WITH MINERALS) TABS tablet Take 1 tablet by mouth daily.    . ondansetron (ZOFRAN) 4 MG tablet Take 1 tablet (4 mg total) by mouth every 8 (eight) hours as needed. for nausea 20 tablet 2  . rizatriptan (MAXALT) 10 MG tablet Take 1 tablet (10 mg total) by mouth as needed for migraine. May repeat in 2 hours if needed 10 tablet 1  . SUMAtriptan (IMITREX) 50 MG tablet Take  one at headache onset.  May repeat in 2 hours one time only 9 tablet 6  . topiramate (TOPAMAX) 25 MG tablet Take one tablet bid 60 tablet 6  . topiramate (TOPAMAX) 50 MG tablet Take 1 tablet (50 mg total) by mouth 2 (two) times daily. 60 tablet 4   No current facility-administered medications on file prior to visit.    Allergies  Allergen Reactions  . Latex Anaphylaxis  . Reglan [Metoclopramide] Anxiety    Review of Systems  Review of Systems  Constitutional: Negative for fever, chills and malaise/fatigue.  HENT: Negative for congestion, hearing loss and nosebleeds.   Eyes: Negative for discharge.  Respiratory: Negative for cough, sputum production, shortness of breath and wheezing.   Cardiovascular: Negative for chest pain, palpitations and leg swelling.    Gastrointestinal: Negative for heartburn, nausea, vomiting, abdominal pain, diarrhea, constipation and blood in stool.  Genitourinary: Negative for dysuria, urgency, frequency and hematuria.  Musculoskeletal: Negative for myalgias, back pain and falls.  Skin: Negative for rash.  Neurological: Positive for headaches. Negative for dizziness, tremors, sensory change, focal weakness, loss of consciousness and weakness.  Endo/Heme/Allergies: Negative for polydipsia. Does not bruise/bleed easily.  Psychiatric/Behavioral: Negative for depression and suicidal ideas. The patient is not nervous/anxious and does not have insomnia.     Objective  BP 100/62 mmHg  Pulse 73  Temp(Src) 98.5 F (36.9 C) (Oral)  Ht 5' (1.524 m)  Wt 117 lb 4 oz (53.184 kg)  BMI 22.90 kg/m2  SpO2 98%  Physical Exam  Physical Exam  Constitutional: She is oriented to person, place, and time and well-developed, well-nourished, and in no distress. No distress.  HENT:  Head: Normocephalic and atraumatic.  Right Ear: External ear normal.  Left Ear: External ear normal.  Nose: Nose normal.  Mouth/Throat: Oropharynx is clear and moist. No oropharyngeal exudate.  Eyes: Conjunctivae are normal. Pupils are equal, round, and reactive to light. Right eye exhibits no discharge. Left eye exhibits no discharge. No scleral icterus.  Neck: Normal range of motion. Neck supple. No thyromegaly present.  Cardiovascular: Normal rate, regular rhythm, normal heart sounds and intact distal pulses.   No murmur heard. Pulmonary/Chest: Effort normal and breath sounds normal. No respiratory distress. She has no wheezes. She has no rales.  Abdominal: Soft. Bowel sounds are normal. She exhibits no distension and no mass. There is no tenderness.  Musculoskeletal: Normal range of motion. She exhibits no edema or tenderness.  Lymphadenopathy:    She has no cervical adenopathy.  Neurological: She is alert and oriented to person, place, and time.  She has normal reflexes. No cranial nerve deficit. Coordination normal.  Skin: Skin is warm and dry. No rash noted. She is not diaphoretic.  Psychiatric: Mood, memory and affect normal.    Lab Results  Component Value Date   TSH 1.246 04/20/2014   Lab Results  Component Value Date   WBC 5.4 10/12/2014   HGB 13.6 10/12/2014   HCT 40.3 10/12/2014   MCV 93.3 10/12/2014   PLT 230 10/12/2014   Lab Results  Component Value Date   CREATININE 0.82 10/12/2014   BUN 12 10/12/2014   NA 142 10/12/2014   K 3.9 10/12/2014   CL 111 10/12/2014   CO2 25 10/12/2014   Lab Results  Component Value Date   ALT 17 10/12/2014   AST 21 10/12/2014   ALKPHOS 58 10/12/2014   BILITOT 0.4 10/12/2014   Lab Results  Component Value Date   CHOL 164 04/20/2014  Lab Results  Component Value Date   HDL 53 04/20/2014   Lab Results  Component Value Date   LDLCALC 93 04/20/2014   Lab Results  Component Value Date   TRIG 88 04/20/2014   Lab Results  Component Value Date   CHOLHDL 3.1 04/20/2014     Assessment & Plan  IBS (irritable bowel syndrome) Avoid offending foods, start probiotics. Do not eat large meals in late evening and consider raising head of bed.    Hot flushes, perimenopausal Encouraged DASH diet, decrease po intake and increase exercise as tolerated. Needs 7-8 hours of sleep nightly. Avoid trans fats, eat small, frequent meals every 4-5 hours with lean proteins, complex carbs and healthy fats. Minimize simple carbs. Try Icool and protein filled evening snack   Migraine Encouraged increased hydration, 64 ounces of clear fluids daily. Minimize alcohol and caffeine. Eat small frequent meals with lean proteins and complex carbs. Avoid high and low blood sugars. Get adequate sleep, 7-8 hours a night. Needs exercise daily preferably in the morning. Tolerating headaches with Topamax   Preventative health care Patient encouraged to maintain heart healthy diet, regular exercise,  adequate sleep. Consider daily probiotics. Take medications as prescribed. Labs reviewed. Given and reviewed copy of ACP documents from [employer] and encouraged to complete and return

## 2015-01-03 NOTE — Assessment & Plan Note (Signed)
Patient encouraged to maintain heart healthy diet, regular exercise, adequate sleep. Consider daily probiotics. Take medications as prescribed. Labs reviewed. Given and reviewed copy of ACP documents from [place] Secretary of State and encouraged to complete and return 

## 2015-01-03 NOTE — Assessment & Plan Note (Signed)
Avoid offending foods, start probiotics. Do not eat large meals in late evening and consider raising head of bed.  

## 2015-01-03 NOTE — Assessment & Plan Note (Signed)
Encouraged DASH diet, decrease po intake and increase exercise as tolerated. Needs 7-8 hours of sleep nightly. Avoid trans fats, eat small, frequent meals every 4-5 hours with lean proteins, complex carbs and healthy fats. Minimize simple carbs. Try Icool and protein filled evening snack

## 2015-01-03 NOTE — Assessment & Plan Note (Signed)
Encouraged increased hydration, 64 ounces of clear fluids daily. Minimize alcohol and caffeine. Eat small frequent meals with lean proteins and complex carbs. Avoid high and low blood sugars. Get adequate sleep, 7-8 hours a night. Needs exercise daily preferably in the morning. Tolerating headaches with Topamax

## 2015-04-16 ENCOUNTER — Encounter: Payer: Self-pay | Admitting: Family Medicine

## 2015-04-19 ENCOUNTER — Telehealth: Payer: Self-pay | Admitting: Family Medicine

## 2015-04-19 ENCOUNTER — Other Ambulatory Visit: Payer: Self-pay | Admitting: Family Medicine

## 2015-04-19 MED ORDER — SUMATRIPTAN SUCCINATE 50 MG PO TABS: ORAL_TABLET | ORAL | Status: DC

## 2015-04-19 MED ORDER — TOPIRAMATE 50 MG PO TABS: 50.0000 mg | ORAL_TABLET | Freq: Two times a day (BID) | ORAL | Status: DC

## 2015-04-19 MED ORDER — IBUPROFEN 800 MG PO TABS: ORAL_TABLET | ORAL | Status: DC

## 2015-04-19 MED ORDER — TOPIRAMATE 25 MG PO TABS: ORAL_TABLET | ORAL | Status: DC

## 2015-04-19 MED ORDER — ONDANSETRON HCL 4 MG PO TABS: 4.0000 mg | ORAL_TABLET | Freq: Three times a day (TID) | ORAL | Status: DC | PRN

## 2015-04-19 MED ORDER — RIZATRIPTAN BENZOATE 10 MG PO TABS: 10.0000 mg | ORAL_TABLET | ORAL | Status: DC | PRN

## 2015-04-19 NOTE — Telephone Encounter (Signed)
refill 

## 2015-04-19 NOTE — Telephone Encounter (Signed)
Pt needing ibuprofen 800mg , zofran 4mg , rizatriptan 10mg , sumatriptan 50mg , topamax 25mg  & topamax 50mg  (she take 75mg  topamax morning and evening). She will be out of all meds Wednesday. Please send to [facility].

## 2015-04-19 NOTE — Telephone Encounter (Signed)
Refills done as requested 

## 2015-06-14 ENCOUNTER — Telehealth: Payer: Self-pay | Admitting: Family Medicine

## 2015-06-14 MED ORDER — ESTRADIOL 2 MG PO TABS: 2.0000 mg | ORAL_TABLET | Freq: Every day | ORAL | Status: DC

## 2015-06-14 NOTE — Telephone Encounter (Signed)
Caller name: Bennetta   Relationship to patient: Self  Can be reached: [phone]  Pharmacy: [facility], [place]  Reason for call: pt need a refill on her estradiol  Rx.

## 2015-06-14 NOTE — Telephone Encounter (Signed)
Sent in prescriptions as requested and patient notified refill done.

## 2015-08-09 DIAGNOSIS — L700 Acne vulgaris: Secondary | ICD-10-CM | POA: Diagnosis not present

## 2015-08-10 MED FILL — TOPIRAMATE 50 MG TABLET: 50 | 30 days supply | Qty: 60 | Fill #2

## 2015-08-10 MED FILL — ESTRADIOL 2 MG TABLET: 2 | 30 days supply | Qty: 30 | Fill #2

## 2015-08-10 MED FILL — TOPIRAMATE 25 MG TABLET: 25 | 30 days supply | Qty: 60 | Fill #2

## 2015-08-10 MED FILL — MYORISAN 30 MG CAPSULE: 30 | 30 days supply | Qty: 60 | Fill #0

## 2015-08-10 MED FILL — SUMATRIPTAN SUCC 50 MG TAB: 50 | 30 days supply | Qty: 9 | Fill #1

## 2015-08-16 MED FILL — RIZATRIPTAN 10 MG TABLET: 10 | 30 days supply | Qty: 9 | Fill #1

## 2015-09-08 DIAGNOSIS — L700 Acne vulgaris: Secondary | ICD-10-CM | POA: Diagnosis not present

## 2015-09-08 MED FILL — MYORISAN 30 MG CAPSULE: 30 | 30 days supply | Qty: 60 | Fill #0

## 2015-09-09 MED FILL — ESTRADIOL 2 MG TABLET: 2 | 30 days supply | Qty: 30 | Fill #3

## 2015-09-09 MED FILL — SUMATRIPTAN SUCC 50 MG TAB: 50 | 30 days supply | Qty: 9 | Fill #2

## 2015-09-09 MED FILL — TOPIRAMATE 25 MG TABLET: 25 | 30 days supply | Qty: 60 | Fill #3

## 2015-09-09 MED FILL — TOPIRAMATE 50 MG TABLET: 50 | 30 days supply | Qty: 60 | Fill #3

## 2015-10-11 ENCOUNTER — Other Ambulatory Visit: Payer: Self-pay | Admitting: Family Medicine

## 2015-10-11 MED ORDER — RIZATRIPTAN BENZOATE 10 MG PO TABS: 10.0000 mg | ORAL_TABLET | ORAL | Status: DC | PRN

## 2015-10-11 MED FILL — TOPIRAMATE 25 MG TABLET: 25 | 30 days supply | Qty: 60 | Fill #4

## 2015-10-11 MED FILL — AMOXICILLIN 500 MG CAPSULE: 500 | 4 days supply | Qty: 16 | Fill #1

## 2015-10-11 MED FILL — ESTRADIOL 2 MG TABLET: 2 | 30 days supply | Qty: 30 | Fill #4

## 2015-10-11 MED FILL — IBUPROFEN 800 MG TABLET: 800 | 7 days supply | Qty: 30 | Fill #1

## 2015-10-11 MED FILL — RIZATRIPTAN 10 MG TABLET: 10 | 30 days supply | Qty: 9 | Fill #0

## 2015-10-11 MED FILL — TOPIRAMATE 50 MG TABLET: 50 | 30 days supply | Qty: 60 | Fill #4

## 2015-11-01 ENCOUNTER — Ambulatory Visit (INDEPENDENT_AMBULATORY_CARE_PROVIDER_SITE_OTHER): Payer: 59 | Admitting: Family Medicine

## 2015-11-01 ENCOUNTER — Encounter: Payer: Self-pay | Admitting: Family Medicine

## 2015-11-01 VITALS — BP 108/70 | HR 90 | Temp 98.1°F | Ht 60.0 in | Wt 115.0 lb

## 2015-11-01 DIAGNOSIS — L708 Other acne: Secondary | ICD-10-CM | POA: Diagnosis not present

## 2015-11-01 DIAGNOSIS — R21 Rash and other nonspecific skin eruption: Secondary | ICD-10-CM | POA: Diagnosis not present

## 2015-11-01 DIAGNOSIS — Z0000 Encounter for general adult medical examination without abnormal findings: Secondary | ICD-10-CM

## 2015-11-01 DIAGNOSIS — L709 Acne, unspecified: Secondary | ICD-10-CM | POA: Insufficient documentation

## 2015-11-01 DIAGNOSIS — G43009 Migraine without aura, not intractable, without status migrainosus: Secondary | ICD-10-CM

## 2015-11-01 HISTORY — DX: Rash and other nonspecific skin eruption: R21

## 2015-11-01 HISTORY — DX: Acne, unspecified: L70.9

## 2015-11-01 MED ORDER — RANITIDINE HCL 150 MG PO TABS: 150.0000 mg | ORAL_TABLET | Freq: Two times a day (BID) | ORAL | Status: DC

## 2015-11-01 MED ORDER — TOPIRAMATE 100 MG PO TABS: 100.0000 mg | ORAL_TABLET | Freq: Two times a day (BID) | ORAL | Status: DC

## 2015-11-01 NOTE — Patient Instructions (Addendum)
Sarna Anti itch lotion for the rash on skin.  NOW Probiotic Vitamin. Available at [employer].  Add Zyrtec and Zantac Otc.  Migraine Headache A migraine headache is an intense, throbbing pain on one or both sides of your head. A migraine can last for 30 minutes to several hours. CAUSES  The exact cause of a migraine headache is not always known. However, a migraine may be caused when nerves in the brain become irritated and release chemicals that cause inflammation. This causes pain. Certain things may also trigger migraines, such as:  Alcohol.  Smoking.  Stress.  Menstruation.  Aged cheeses.  Foods or drinks that contain nitrates, glutamate, aspartame, or tyramine.  Lack of sleep.  Chocolate.  Caffeine.  Hunger.  Physical exertion.  Fatigue.  Medicines used to treat chest pain (nitroglycerine), birth control pills, estrogen, and some blood pressure medicines. SIGNS AND SYMPTOMS  Pain on one or both sides of your head.  Pulsating or throbbing pain.  Severe pain that prevents daily activities.  Pain that is aggravated by any physical activity.  Nausea, vomiting, or both.  Dizziness.  Pain with exposure to bright lights, loud noises, or activity.  General sensitivity to bright lights, loud noises, or smells. Before you get a migraine, you may get warning signs that a migraine is coming (aura). An aura may include:  Seeing flashing lights.  Seeing bright spots, halos, or zigzag lines.  Having tunnel vision or blurred vision.  Having feelings of numbness or tingling.  Having trouble talking.  Having muscle weakness. DIAGNOSIS  A migraine headache is often diagnosed based on:  Symptoms.  Physical exam.  A CT scan or MRI of your head. These imaging tests cannot diagnose migraines, but they can help rule out other causes of headaches. TREATMENT Medicines may be given for pain and nausea. Medicines can also be given to help prevent  recurrent migraines.  HOME CARE INSTRUCTIONS  Only take over-the-counter or prescription medicines for pain or discomfort as directed by your health care provider. The use of long-term narcotics is not recommended.  Lie down in a dark, quiet room when you have a migraine.  Keep a journal to find out what may trigger your migraine headaches. For example, write down:  What you eat and drink.  How much sleep you get.  Any change to your diet or medicines.  Limit alcohol consumption.  Quit smoking if you smoke.  Get 7-9 hours of sleep, or as recommended by your health care provider.  Limit stress.  Keep lights dim if bright lights bother you and make your migraines worse. SEEK IMMEDIATE MEDICAL CARE IF:   Your migraine becomes severe.  You have a fever.  You have a stiff neck.  You have vision loss.  You have muscular weakness or loss of muscle control.  You start losing your balance or have trouble walking.  You feel faint or pass out.  You have severe symptoms that are different from your first symptoms. MAKE SURE YOU:   Understand these instructions.  Will watch your condition.  Will get help right away if you are not doing well or get worse.   This information is not intended to replace advice given to you by your health care provider. Make sure you discuss any questions you have with your health care provider.   Document Released: 07/17/2005 Document Revised: 08/07/2014 Document Reviewed: 03/24/2013 Elsevier Interactive Patient Education [employer].

## 2015-11-01 NOTE — Progress Notes (Signed)
Pre visit review using our clinic review tool, if applicable. No additional management support is needed unless otherwise documented below in the visit note. 

## 2015-11-01 NOTE — Assessment & Plan Note (Signed)
Encouraged increased hydration, 64 ounces of clear fluids daily. Minimize alcohol and caffeine. Eat small frequent meals with lean proteins and complex carbs. Avoid high and low blood sugars. Get adequate sleep, 7-8 hours a night. Needs exercise daily preferably in the morning.  

## 2015-11-01 NOTE — Assessment & Plan Note (Signed)
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 Will start Zantac and Zyrtec bid, use Triamcinolone cream, 0.1% qhs use witch Hazel and Sarna lotion prn

## 2015-11-01 NOTE — Progress Notes (Signed)
Subjective:    Patient ID: Alexandra Cabrera, female    DOB: 01-07-1966, 50 y.o.   MRN: [id number]  Chief Complaint  Patient presents with  . Follow-up    medication    HPI Patient is in today for follow up with medication management. Patient also reports her migraine seem to be worse with the yellow lights at work.  Patients says she also had the issue with yellow light at home but has changed the lights at home and this has significantly improved her migraines at home but she still struggles with them at work.  Patient also reports some concerns with a itchy rash on her wrist, hands, top of ears, feet, ankles and inner elbow, patient is seeing dermatologist for skin rash but will follow up with Dr. Sammuel Hines.  Denies CP/palp/SOB/HA/congestion/fevers/GI or GU c/o. Taking meds as prescribed.   Past Medical History  Diagnosis Date  . Migraines   . PONV (postoperative nausea and vomiting)     "difficult to wake up."  . Family history of adverse reaction to anesthesia     " my sisiter has had Malignant hypertheria twice, I have never had that problem"; (patient had sevo and succinylcholine with no apparent complication on [id number] at [facility])  . Asthma     childhood and rare as adult  . IBS (irritable bowel syndrome) 01/03/2015  . Preventative health care 01/03/2015  . Acne 11/01/2015  . Rash and nonspecific skin eruption 11/01/2015    Past Surgical History  Procedure Laterality Date  . Total knee arthroplasty      8 knee replacements, 28 surgeries on right knee  . Abdominal hysterectomy    . Appendectomy    . Cholecystectomy N/A 10/12/2014    Procedure: LAPAROSCOPIC CHOLECYSTECTOMY WITH INTRAOPERATIVE CHOLANGIOGRAM;  Surgeon: Doreen Salvage, MD;  Location: [facility];  Service: General;  Laterality: N/A;  . Hemorroidectomy      young adult    Family History  Problem Relation Age of Onset  . Diabetes Mother   . Heart disease Mother     MI  . Arthritis Father   . COPD Father     smoker  . Hypertension Father   . Hyperlipidemia Father   . Arthritis Sister   . Arthritis Brother   . Cancer Maternal Grandmother 45    ovarian  . Arthritis Paternal Grandmother   . Heart disease Paternal Grandfather     MI    Social History   Social History  . Marital Status: Married    Spouse Name: N/A  . Number of Children: N/A  . Years of Education: N/A   Occupational History  . Not on file.   Social History Main Topics  . Smoking status: Never Smoker   . Smokeless tobacco: Never Used  . Alcohol Use: No  . Drug Use: No  . 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 Not on file   Social History Narrative    Outpatient Prescriptions Prior to Visit  Medication Sig Dispense Refill  . estradiol (ESTRACE) 2 MG tablet Take 1 tablet (2 mg total) by mouth daily. 30 tablet 5  . ibuprofen (ADVIL,MOTRIN) 800 MG tablet TAKE 1 TABLET BY MOUTH AT ONSET OF MIGRAINE MAY REPEAT IN 6 HOURS AS NEEDED 30 tablet 5  . Multiple Vitamin (MULTIVITAMIN WITH MINERALS) TABS tablet Take 1  tablet by mouth daily.    . ondansetron (ZOFRAN) 4 MG tablet Take 1 tablet (4 mg total) by mouth every 8 (eight) hours as needed. for nausea 20 tablet 2  . rizatriptan (MAXALT) 10 MG tablet Take 1 tablet (10 mg total) by mouth as needed for migraine. May repeat in 2 hours if needed 10 tablet 2  . SUMAtriptan (IMITREX) 50 MG tablet Take one at headache onset.  May repeat in 2 hours one time only 9 tablet 5  . topiramate (TOPAMAX) 25 MG tablet Take one tablet bid 60 tablet 5  . topiramate (TOPAMAX) 50 MG tablet Take 1 tablet (50 mg total) by mouth 2 (two) times daily. 60 tablet 5   No facility-administered medications prior to visit.    Allergies  Allergen Reactions  . Latex Anaphylaxis  . Reglan [Metoclopramide] Anxiety    Review of Systems  Constitutional: Negative for fever  and malaise/fatigue.  HENT: Negative for congestion.   Eyes: Positive for double vision. Negative for blurred vision.  Respiratory: Negative for shortness of breath.   Cardiovascular: Negative for chest pain, palpitations and leg swelling.  Gastrointestinal: Positive for nausea. Negative for abdominal pain and blood in stool.  Genitourinary: Negative for dysuria and frequency.  Musculoskeletal: Negative for falls.  Skin: Positive for itching and rash (wrist, inner elbow, feet and hands).  Neurological: Positive for headaches. Negative for dizziness and loss of consciousness.  Endo/Heme/Allergies: Negative for environmental allergies.  Psychiatric/Behavioral: Negative for depression. The patient is not nervous/anxious.        Objective:    Physical Exam  Constitutional: She is oriented to person, place, and time. She appears well-developed and well-nourished. No distress.  HENT:  Head: Normocephalic and atraumatic.  Eyes: Conjunctivae are normal.  Neck: Neck supple. No thyromegaly present.  Cardiovascular: Normal rate, regular rhythm and normal heart sounds.   No murmur heard. Pulmonary/Chest: Effort normal and breath sounds normal. No respiratory distress.  Abdominal: Soft. Bowel sounds are normal. She exhibits no distension and no mass. There is no tenderness.  Musculoskeletal: She exhibits no edema.  Lymphadenopathy:    She has no cervical adenopathy.  Neurological: She is alert and oriented to person, place, and time.  Skin: Skin is warm and dry. Rash noted.  Small, papular scabbed lesions on b/l wrists.  Psychiatric: She has a normal mood and affect. Her behavior is normal.    BP 108/70 mmHg  Pulse 90  Temp(Src) 98.1 F (36.7 C) (Oral)  Ht 5' (1.524 m)  Wt 115 lb (52.164 kg)  BMI 22.46 kg/m2  SpO2 98% Wt Readings from Last 3 Encounters:  11/01/15 115 lb (52.164 kg)  12/25/14 117 lb 4 oz (53.184 kg)  11/04/14 118 lb (53.524 kg)     Lab Results  Component Value  Date   WBC 5.4 10/12/2014   HGB 13.6 10/12/2014   HCT 40.3 10/12/2014   PLT 230 10/12/2014   GLUCOSE 96 10/12/2014   CHOL 164 04/20/2014   TRIG 88 04/20/2014   HDL 53 04/20/2014   LDLCALC 93 04/20/2014   ALT 17 10/12/2014   AST 21 10/12/2014   NA 142 10/12/2014   K 3.9 10/12/2014   CL 111 10/12/2014   CREATININE 0.82 10/12/2014   BUN 12 10/12/2014   CO2 25 10/12/2014   TSH 1.246 04/20/2014    Lab Results  Component Value Date   TSH 1.246 04/20/2014   Lab Results  Component Value Date   WBC 5.4 10/12/2014   HGB  13.6 10/12/2014   HCT 40.3 10/12/2014   MCV 93.3 10/12/2014   PLT 230 10/12/2014   Lab Results  Component Value Date   NA 142 10/12/2014   K 3.9 10/12/2014   CO2 25 10/12/2014   GLUCOSE 96 10/12/2014   BUN 12 10/12/2014   CREATININE 0.82 10/12/2014   BILITOT 0.4 10/12/2014   ALKPHOS 58 10/12/2014   AST 21 10/12/2014   ALT 17 10/12/2014   PROT 6.1 10/12/2014   ALBUMIN 3.7 10/12/2014   CALCIUM 9.2 10/12/2014   ANIONGAP 6 10/12/2014   Lab Results  Component Value Date   CHOL 164 04/20/2014   Lab Results  Component Value Date   HDL 53 04/20/2014   Lab Results  Component Value Date   LDLCALC 93 04/20/2014   Lab Results  Component Value Date   TRIG 88 04/20/2014   Lab Results  Component Value Date   CHOLHDL 3.1 04/20/2014   No results found for: HGBA1C     Assessment & Plan:   Problem List Items Addressed This Visit    Acne    Follows with dermatology uses [facility]. They note her Triglycerides have been low, they would like it evaluated.  She also notes a pruritic rash. Use Witch Hazel Astrngent prn, add a probiotic daily.      Relevant Orders   CBC   TSH   Lipid panel   Comprehensive metabolic panel   Migraine - Primary    Encouraged increased hydration, 64 ounces of clear fluids daily. Minimize alcohol and caffeine. Eat small frequent meals with lean proteins and complex carbs. Avoid high and low blood sugars. Get adequate sleep,  7-8 hours a night. Needs exercise daily preferably in the morning.      Relevant Medications   topiramate (TOPAMAX) 100 MG tablet   Other Relevant Orders   CBC   TSH   Lipid panel   Comprehensive metabolic panel   Preventative health care   Relevant Orders   CBC   TSH   Lipid panel   Comprehensive metabolic panel   Rash and nonspecific skin eruption    B/l wrists she is allergic to latex and other rubbers. Will start Zantac and Zyrtec bid, use Triamcinolone cream, 0.1% qhs use witch Hazel and Sarna lotion prn      Relevant Orders   CBC   TSH   Lipid panel   Comprehensive metabolic panel      I have discontinued Ms. Apuzzo's topiramate and topiramate. I am also having her start on topiramate and ranitidine. Additionally, I am having her maintain her multivitamin with minerals, ibuprofen, ondansetron, SUMAtriptan, estradiol, and rizatriptan.  Meds ordered this encounter  Medications  . topiramate (TOPAMAX) 100 MG tablet    Sig: Take 1 tablet (100 mg total) by mouth 2 (two) times daily.    Dispense:  60 tablet    Refill:  5  . ranitidine (ZANTAC) 150 MG tablet    Sig: Take 1 tablet (150 mg total) by mouth 2 (two) times daily.    Dispense:  30 tablet    Refill:  5     Penni Homans, MD

## 2015-11-01 NOTE — Assessment & Plan Note (Signed)
Follows with dermatology uses [facility]. They note her Triglycerides have been low, they would like it evaluated.  She also notes a pruritic rash. Use Witch Hazel Astrngent prn, add a probiotic daily.

## 2015-11-08 MED FILL — TOPIRAMATE 100 MG TABLET: 100 | 30 days supply | Qty: 60 | Fill #0

## 2015-11-09 MED FILL — ONDANSETRON HCL 4 MG TABLET: 4 | 6 days supply | Qty: 20 | Fill #1

## 2015-11-09 MED FILL — SUMATRIPTAN SUCC 50 MG TAB: 50 | 30 days supply | Qty: 9 | Fill #3

## 2015-11-09 MED FILL — ESTRADIOL 2 MG TABLET: 2 | 30 days supply | Qty: 30 | Fill #5

## 2015-11-09 MED FILL — RIZATRIPTAN 10 MG TABLET: 10 | 30 days supply | Qty: 9 | Fill #1

## 2015-12-13 ENCOUNTER — Other Ambulatory Visit: Payer: Self-pay | Admitting: Family Medicine

## 2015-12-13 MED FILL — ESTRADIOL 2 MG TABLET: 2 | 30 days supply | Qty: 30 | Fill #0

## 2015-12-13 MED FILL — SUMATRIPTAN SUCC 50 MG TAB: 50 | 30 days supply | Qty: 9 | Fill #4

## 2015-12-13 MED FILL — TOPIRAMATE 100 MG TABLET: 100 | 30 days supply | Qty: 60 | Fill #1

## 2015-12-27 ENCOUNTER — Encounter: Payer: 59 | Admitting: Family Medicine

## 2016-01-03 ENCOUNTER — Encounter: Payer: 59 | Admitting: Family Medicine

## 2016-01-13 MED FILL — TOPIRAMATE 100 MG TABLET: 100 | 30 days supply | Qty: 60 | Fill #2

## 2016-01-13 MED FILL — RIZATRIPTAN 10 MG TABLET: 10 | 30 days supply | Qty: 9 | Fill #2

## 2016-01-13 MED FILL — ESTRADIOL 2 MG TABLET: 2 | 30 days supply | Qty: 30 | Fill #1

## 2016-01-13 MED FILL — IBUPROFEN 800 MG TABLET: 800 | 7 days supply | Qty: 30 | Fill #2

## 2016-01-24 MED FILL — AMOXICILLIN 500 MG CAPSULE: 500 | 4 days supply | Qty: 16 | Fill #2

## 2016-02-09 MED FILL — ESTRADIOL 2 MG TABLET: 2 | 30 days supply | Qty: 30 | Fill #2

## 2016-02-09 MED FILL — SUMATRIPTAN SUCC 50 MG TAB: 50 | 30 days supply | Qty: 9 | Fill #5

## 2016-02-09 MED FILL — TOPIRAMATE 100 MG TABLET: 100 | 30 days supply | Qty: 60 | Fill #3

## 2016-03-08 MED FILL — TOPIRAMATE 100 MG TABLET: 100 | 30 days supply | Qty: 60 | Fill #4

## 2016-03-08 MED FILL — ESTRADIOL 2 MG TABLET: 2 | 30 days supply | Qty: 30 | Fill #3

## 2016-03-13 ENCOUNTER — Telehealth: Payer: Self-pay | Admitting: Family Medicine

## 2016-03-13 MED ORDER — RIZATRIPTAN BENZOATE 10 MG PO TABS: 10.0000 mg | ORAL_TABLET | ORAL | 2 refills | Status: DC | PRN

## 2016-03-13 MED FILL — RIZATRIPTAN 10 MG TABLET: 10 | 30 days supply | Qty: 9 | Fill #0

## 2016-03-13 NOTE — Telephone Encounter (Signed)
Refill done.  

## 2016-03-27 ENCOUNTER — Other Ambulatory Visit: Payer: Self-pay | Admitting: Family Medicine

## 2016-03-27 MED FILL — SUMATRIPTAN SUCC 50 MG TAB: 50 | 30 days supply | Qty: 9 | Fill #0

## 2016-04-06 MED FILL — ONDANSETRON HCL 4 MG TABLET: 4 | 6 days supply | Qty: 20 | Fill #2

## 2016-04-06 MED FILL — TOPIRAMATE 100 MG TABLET: 100 | 30 days supply | Qty: 60 | Fill #5

## 2016-04-06 MED FILL — IBUPROFEN 800 MG TABLET: 800 | 7 days supply | Qty: 30 | Fill #3

## 2016-04-06 MED FILL — ESTRADIOL 2 MG TABLET: 2 | 30 days supply | Qty: 30 | Fill #4

## 2016-04-24 MED FILL — RIZATRIPTAN 10 MG TABLET: 10 | 30 days supply | Qty: 9 | Fill #1

## 2016-05-02 ENCOUNTER — Other Ambulatory Visit (INDEPENDENT_AMBULATORY_CARE_PROVIDER_SITE_OTHER): Payer: 59

## 2016-05-02 DIAGNOSIS — Z0000 Encounter for general adult medical examination without abnormal findings: Secondary | ICD-10-CM | POA: Diagnosis not present

## 2016-05-02 DIAGNOSIS — G43009 Migraine without aura, not intractable, without status migrainosus: Secondary | ICD-10-CM

## 2016-05-02 DIAGNOSIS — R21 Rash and other nonspecific skin eruption: Secondary | ICD-10-CM

## 2016-05-02 DIAGNOSIS — L708 Other acne: Secondary | ICD-10-CM

## 2016-05-02 LAB — COMPREHENSIVE METABOLIC PANEL
ALBUMIN: 4 g/dL (ref 3.5–5.2)
ALK PHOS: 60 U/L (ref 39–117)
ALT: 19 U/L (ref 0–35)
AST: 21 U/L (ref 0–37)
BUN: 11 mg/dL (ref 6–23)
CO2: 26 mEq/L (ref 19–32)
Calcium: 8.7 mg/dL (ref 8.4–10.5)
Chloride: 109 mEq/L (ref 96–112)
Creatinine, Ser: 0.9 mg/dL (ref 0.40–1.20)
GFR: 70.06 mL/min (ref >60.00)
Glucose, Bld: 89 mg/dL (ref 70–99)
POTASSIUM: 3.9 meq/L (ref 3.5–5.1)
SODIUM: 141 meq/L (ref 135–145)
TOTAL PROTEIN: 6.8 g/dL (ref 6.0–8.3)
Total Bilirubin: 0.3 mg/dL (ref 0.2–1.2)

## 2016-05-02 LAB — CBC
HCT: 41.1 % (ref 36.0–46.0)
Hemoglobin: 13.9 g/dL (ref 12.0–15.0)
MCHC: 33.9 g/dL (ref 30.0–36.0)
MCV: 93.7 fl (ref 78.0–100.0)
Platelets: 267 10*3/uL (ref 150.0–400.0)
RBC: 4.39 Mil/uL (ref 3.87–5.11)
RDW: 13.2 % (ref 11.5–15.5)
WBC: 5.7 10*3/uL (ref 4.0–10.5)

## 2016-05-02 LAB — LIPID PANEL
CHOLESTEROL: 204 mg/dL — AB (ref 0–200)
HDL: 80.3 mg/dL (ref >39.00)
LDL Cholesterol: 112 mg/dL — ABNORMAL HIGH (ref 0–99)
NonHDL: 123.99
Total CHOL/HDL Ratio: 3
Triglycerides: 60 mg/dL (ref 0.0–149.0)
VLDL: 12 mg/dL (ref 0.0–40.0)

## 2016-05-02 LAB — TSH: TSH: 2.07 u[IU]/mL (ref 0.35–4.50)

## 2016-05-08 ENCOUNTER — Other Ambulatory Visit: Payer: Self-pay | Admitting: Family Medicine

## 2016-05-08 MED FILL — ESTRADIOL 2 MG TABLET: 2 | 30 days supply | Qty: 30 | Fill #5

## 2016-05-08 MED FILL — TOPIRAMATE 100 MG TABLET: 100 | 30 days supply | Qty: 60 | Fill #0

## 2016-05-11 ENCOUNTER — Encounter: Payer: Self-pay | Admitting: Family Medicine

## 2016-05-11 ENCOUNTER — Ambulatory Visit (INDEPENDENT_AMBULATORY_CARE_PROVIDER_SITE_OTHER): Payer: 59 | Admitting: Family Medicine

## 2016-05-11 VITALS — BP 108/78 | HR 82 | Temp 97.9°F | Ht 60.0 in | Wt 118.1 lb

## 2016-05-11 DIAGNOSIS — G43819 Other migraine, intractable, without status migrainosus: Secondary | ICD-10-CM | POA: Diagnosis not present

## 2016-05-11 DIAGNOSIS — E782 Mixed hyperlipidemia: Secondary | ICD-10-CM

## 2016-05-11 MED ORDER — KETOROLAC TROMETHAMINE 10 MG PO TABS: 10.0000 mg | ORAL_TABLET | Freq: Four times a day (QID) | ORAL | 0 refills | Status: DC | PRN

## 2016-05-11 MED FILL — KETOROLAC 10 MG TABLET: 10 | 2 days supply | Qty: 5 | Fill #0

## 2016-05-11 NOTE — Patient Instructions (Signed)
[employer] kr[employer] or [employer] by [employer]   Cholesterol Cholesterol is a white, waxy, fat-like substance needed by your body in small amounts. The liver makes all the cholesterol you need. Cholesterol is carried from the liver by the blood through the blood vessels. Deposits of cholesterol (plaque) may build up on blood vessel walls. These make the arteries narrower and stiffer. Cholesterol plaques increase the risk for heart attack and stroke.  You cannot feel your cholesterol level even if it is very high. The only way to know it is high is with a blood test. Once you know your cholesterol levels, you should keep a record of the test results. Work with your health care provider to keep your levels in the desired range.  WHAT DO THE RESULTS MEAN?  Total cholesterol is a rough measure of all the cholesterol in your blood.   LDL is the so-called bad cholesterol. This is the type that deposits cholesterol in the walls of the arteries. You want this level to be low.   HDL is the good cholesterol because it cleans the arteries and carries the LDL away. You want this level to be high.  Triglycerides are fat that the body can either burn for energy or store. High levels are closely linked to heart disease.  WHAT ARE THE DESIRED LEVELS OF CHOLESTEROL?  Total cholesterol below 200.   LDL below 100 for people at risk, below 70 for those at very high risk.   HDL above 50 is good, above 60 is best.   Triglycerides below 150.  HOW CAN I LOWER MY CHOLESTEROL?  Diet. Follow your diet programs as directed by your health care provider.   Choose fish or white meat chicken and [place], roasted or baked. Limit fatty cuts of red meat, fried foods, and processed meats, such as sausage and lunch meats.   Eat lots of fresh fruits and vegetables.  Choose whole grains, beans, pasta, potatoes, and cereals.   Use only small amounts of olive, corn, or canola oils.   Avoid butter, mayonnaise,  shortening, or palm kernel oils.  Avoid foods with trans fats.   Drink skim or nonfat milk and eat low-fat or nonfat yogurt and cheeses. Avoid whole milk, cream, ice cream, egg yolks, and full-fat cheeses.   Healthy desserts include angel food cake, ginger snaps, animal crackers, hard candy, popsicles, and low-fat or nonfat frozen yogurt. Avoid pastries, cakes, pies, and cookies.   Exercise. Follow your exercise programs as directed by your health care provider.   A regular program helps decrease LDL and raise HDL.   A regular program helps with weight control.   Do things that increase your activity level like gardening, walking, or taking the stairs. Ask your health care provider about how you can be more active in your daily life.   Medicine. Take medicine only as directed by your health care provider.   Medicine may be prescribed by your health care provider to help lower cholesterol and decrease the risk for heart disease.   If you have several risk factors, you may need medicine even if your levels are normal.   This information is not intended to replace advice given to you by your health care provider. Make sure you discuss any questions you have with your health care provider.   Document Released: 04/11/2001 Document Revised: 08/07/2014 Document Reviewed: 04/30/2013 Elsevier Interactive Patient Education [employer].

## 2016-05-11 NOTE — Progress Notes (Signed)
Pre visit review using our clinic review tool, if applicable. No additional management support is needed unless otherwise documented below in the visit note. 

## 2016-05-17 ENCOUNTER — Encounter: Payer: Self-pay | Admitting: Family Medicine

## 2016-05-17 DIAGNOSIS — E782 Mixed hyperlipidemia: Secondary | ICD-10-CM

## 2016-05-17 HISTORY — DX: Mixed hyperlipidemia: E78.2

## 2016-05-17 NOTE — Progress Notes (Signed)
Patient ID: ELISABETTA OPPERMANN, female   DOB: 08-26-1965, 50 y.o.   MRN: [id number]   Subjective:    Patient ID: Nyoka Cowden, female    DOB: Dec 18, 1965, 50 y.o.   MRN: [id number]  Chief Complaint  Patient presents with  . Follow-up    HPI Patient is in today for follow up. She has recently struggled with a 3 day long migraine which is now starting to dissipate. She has used po Toradol previously for bad migraines with good results. Otherwise she has done well since last visit. Denies CP/palp/SOB/HA/congestion/fevers/GI or GU c/o. Taking meds as prescribed  Past Medical History:  Diagnosis Date  . Acne 11/01/2015  . Asthma    childhood and rare as adult  . Family history of adverse reaction to anesthesia    " my sisiter has had Malignant hypertheria twice, I have never had that problem"; (patient had sevo and succinylcholine with no apparent complication on [id number] at [facility])  . Hyperlipidemia, mixed 05/17/2016  . IBS (irritable bowel syndrome) 01/03/2015  . Migraines   . PONV (postoperative nausea and vomiting)    "difficult to wake up."  . Preventative health care 01/03/2015  . Rash and nonspecific skin eruption 11/01/2015    Past Surgical History:  Procedure Laterality Date  . ABDOMINAL HYSTERECTOMY    . APPENDECTOMY    . CHOLECYSTECTOMY N/A 10/12/2014   Procedure: LAPAROSCOPIC CHOLECYSTECTOMY WITH INTRAOPERATIVE CHOLANGIOGRAM;  Surgeon: Doreen Salvage, MD;  Location: [facility];  Service: General;  Laterality: N/A;  . HEMORROIDECTOMY     young adult  . TOTAL KNEE ARTHROPLASTY     8 knee replacements, 28 surgeries on right knee    Family History  Problem Relation Age of Onset  . Diabetes Mother   . Heart disease Mother     MI  . Arthritis Father   . COPD Father     smoker  . Hypertension Father   . Hyperlipidemia Father   . Arthritis Sister   . Arthritis Brother   . Cancer Maternal Grandmother 45    ovarian  . Arthritis Paternal Grandmother   . Heart disease  Paternal Grandfather     MI    Social History   Social History  . Marital status: Married    Spouse name: N/A  . Number of children: N/A  . Years of education: N/A   Occupational History  . Not on file.   Social History Main Topics  . Smoking status: Never Smoker  . Smokeless tobacco: Never Used  . Alcohol use No  . Drug use: No  . 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 Not on file   Social History Narrative  . No narrative on file    Outpatient Medications Prior to Visit  Medication Sig Dispense Refill  . estradiol (ESTRACE) 2 MG tablet TAKE 1 TABLET BY MOUTH DAILY 30 tablet 5  . ibuprofen (ADVIL,MOTRIN) 800 MG tablet TAKE 1 TABLET BY MOUTH AT ONSET OF MIGRAINE MAY REPEAT IN 6 HOURS AS NEEDED 30 tablet 5  . Multiple Vitamin (MULTIVITAMIN WITH MINERALS) TABS tablet Take 1 tablet by mouth daily.    . ondansetron (ZOFRAN) 4 MG tablet Take 1 tablet (4 mg total) by mouth every 8 (eight) hours as needed. for nausea 20 tablet 2  . ranitidine (ZANTAC) 150 MG tablet Take 1 tablet (  150 mg total) by mouth 2 (two) times daily. 30 tablet 5  . rizatriptan (MAXALT) 10 MG tablet Take 1 tablet (10 mg total) by mouth as needed for migraine. May repeat in 2 hours if needed 10 tablet 2  . SUMAtriptan (IMITREX) 50 MG tablet TAKE 1 TABLET BY MOUTH AT HEADACHE ONSET. MAY REPEAT IN 2 HOURS ONE TIME ONLY 9 tablet 5  . topiramate (TOPAMAX) 100 MG tablet TAKE 1 TABLET (100 MG TOTAL) BY MOUTH 2 (TWO) TIMES DAILY. 60 tablet 5   No facility-administered medications prior to visit.     Allergies  Allergen Reactions  . Latex Anaphylaxis  . Reglan [Metoclopramide] Anxiety    Review of Systems  Constitutional: Negative for fever and malaise/fatigue.  HENT: Negative for congestion.   Eyes: Negative for blurred vision.  Respiratory: Negative for shortness of breath.     Cardiovascular: Negative for chest pain, palpitations and leg swelling.  Gastrointestinal: Negative for abdominal pain, blood in stool and nausea.  Genitourinary: Negative for dysuria and frequency.  Musculoskeletal: Negative for falls.  Skin: Negative for rash.  Neurological: Positive for headaches. Negative for dizziness and loss of consciousness.  Endo/Heme/Allergies: Negative for environmental allergies.  Psychiatric/Behavioral: Negative for depression. The patient is not nervous/anxious.        Objective:    Physical Exam  Constitutional: She is oriented to person, place, and time. She appears well-developed and well-nourished. No distress.  HENT:  Head: Normocephalic and atraumatic.  Nose: Nose normal.  Eyes: Right eye exhibits no discharge. Left eye exhibits no discharge.  Neck: Normal range of motion. Neck supple.  Cardiovascular: Normal rate and regular rhythm.   No murmur heard. Pulmonary/Chest: Effort normal and breath sounds normal.  Abdominal: Soft. Bowel sounds are normal. There is no tenderness.  Musculoskeletal: She exhibits no edema.  Neurological: She is alert and oriented to person, place, and time.  Skin: Skin is warm and dry.  Psychiatric: She has a normal mood and affect.  Nursing note and vitals reviewed.   BP 108/78 (BP Location: Left Arm, Patient Position: Sitting, Cuff Size: Normal)   Pulse 82   Temp 97.9 F (36.6 C) (Oral)   Ht 5' (1.524 m)   Wt 118 lb 2 oz (53.6 kg)   BMI 23.07 kg/m  Wt Readings from Last 3 Encounters:  05/11/16 118 lb 2 oz (53.6 kg)  11/01/15 115 lb (52.2 kg)  12/25/14 117 lb 4 oz (53.2 kg)     Lab Results  Component Value Date   WBC 5.7 05/02/2016   HGB 13.9 05/02/2016   HCT 41.1 05/02/2016   PLT 267.0 05/02/2016   GLUCOSE 89 05/02/2016   CHOL 204 (H) 05/02/2016   TRIG 60.0 05/02/2016   HDL 80.30 05/02/2016   LDLCALC 112 (H) 05/02/2016   ALT 19 05/02/2016   AST 21 05/02/2016   NA 141 05/02/2016   K 3.9  05/02/2016   CL 109 05/02/2016   CREATININE 0.90 05/02/2016   BUN 11 05/02/2016   CO2 26 05/02/2016   TSH 2.07 05/02/2016    Lab Results  Component Value Date   TSH 2.07 05/02/2016   Lab Results  Component Value Date   WBC 5.7 05/02/2016   HGB 13.9 05/02/2016   HCT 41.1 05/02/2016   MCV 93.7 05/02/2016   PLT 267.0 05/02/2016   Lab Results  Component Value Date   NA 141 05/02/2016   K 3.9 05/02/2016   CO2 26 05/02/2016   GLUCOSE 89 05/02/2016  BUN 11 05/02/2016   CREATININE 0.90 05/02/2016   BILITOT 0.3 05/02/2016   ALKPHOS 60 05/02/2016   AST 21 05/02/2016   ALT 19 05/02/2016   PROT 6.8 05/02/2016   ALBUMIN 4.0 05/02/2016   CALCIUM 8.7 05/02/2016   ANIONGAP 6 10/12/2014   GFR 70.06 05/02/2016   Lab Results  Component Value Date   CHOL 204 (H) 05/02/2016   Lab Results  Component Value Date   HDL 80.30 05/02/2016   Lab Results  Component Value Date   LDLCALC 112 (H) 05/02/2016   Lab Results  Component Value Date   TRIG 60.0 05/02/2016   Lab Results  Component Value Date   CHOLHDL 3 05/02/2016   No results found for: HGBA1C     Assessment & Plan:   Problem List Items Addressed This Visit    Migraine - Primary    Encouraged increased hydration, 64 ounces of clear fluids daily. Minimize alcohol and caffeine. Eat small frequent meals with lean proteins and complex carbs. Avoid high and low blood sugars. Get adequate sleep, 7-8 hours a night. Needs exercise daily preferably in the morning. Allowed an rx for Toradol po a small amount to use sparingly      Relevant Medications   ketorolac (TORADOL) 10 MG tablet   Hyperlipidemia, mixed    Encouraged heart healthy diet, increase exercise, avoid trans fats, consider a krill oil cap daily       Other Visit Diagnoses   None.     I am having Ms. Costantino start on ketorolac. I am also having her maintain her multivitamin with minerals, ibuprofen, ondansetron, ranitidine, estradiol, rizatriptan,  SUMAtriptan, and topiramate.  Meds ordered this encounter  Medications  . ketorolac (TORADOL) 10 MG tablet    Sig: Take 1 tablet (10 mg total) by mouth every 6 (six) hours as needed.    Dispense:  5 tablet    Refill:  0     Penni Homans, MD

## 2016-05-17 NOTE — Assessment & Plan Note (Signed)
Encouraged heart healthy diet, increase exercise, avoid trans fats, consider a krill oil cap daily 

## 2016-05-17 NOTE — Assessment & Plan Note (Signed)
Encouraged increased hydration, 64 ounces of clear fluids daily. Minimize alcohol and caffeine. Eat small frequent meals with lean proteins and complex carbs. Avoid high and low blood sugars. Get adequate sleep, 7-8 hours a night. Needs exercise daily preferably in the morning. Allowed an rx for Toradol po a small amount to use sparingly

## 2016-05-18 MED FILL — SUMATRIPTAN SUCC 50 MG TAB: 50 | 30 days supply | Qty: 9 | Fill #1

## 2016-05-29 MED FILL — AMOXICILLIN 500 MG CAPSULE: 500 | 3 days supply | Qty: 12 | Fill #0

## 2016-06-02 ENCOUNTER — Other Ambulatory Visit: Payer: Self-pay | Admitting: Family Medicine

## 2016-06-02 MED FILL — TOPIRAMATE 100 MG TABLET: 100 | 30 days supply | Qty: 60 | Fill #1

## 2016-06-02 MED FILL — ESTRADIOL 2 MG TABLET: 2 | 30 days supply | Qty: 30 | Fill #0

## 2016-06-02 MED FILL — RIZATRIPTAN 10 MG TABLET: 10 | 30 days supply | Qty: 9 | Fill #2

## 2016-06-07 DIAGNOSIS — H5213 Myopia, bilateral: Secondary | ICD-10-CM | POA: Diagnosis not present

## 2016-06-07 DIAGNOSIS — H524 Presbyopia: Secondary | ICD-10-CM | POA: Diagnosis not present

## 2016-06-20 MED FILL — SUMATRIPTAN SUCC 50 MG TAB: 50 | 30 days supply | Qty: 9 | Fill #2

## 2016-07-03 MED FILL — ESTRADIOL 2 MG TABLET: 2 | 30 days supply | Qty: 30 | Fill #1

## 2016-07-03 MED FILL — TOPIRAMATE 100 MG TABLET: 100 | 30 days supply | Qty: 60 | Fill #2

## 2016-07-17 ENCOUNTER — Telehealth: Payer: Self-pay | Admitting: Family Medicine

## 2016-07-17 NOTE — Telephone Encounter (Signed)
Caller name: Relationship to patient: Self Can be reached: [phone]  Pharmacy:  Reason for call: Patient states that she has fluid behind her ears and needs a Rx sent to the pharmacy. States it was looked at by a PA in the ER downstairs.

## 2016-07-17 NOTE — Telephone Encounter (Signed)
There is no good treatment for this. Can try Cetirizine 10 mg po qd, disp #30 with 1 rf and Flonase 2 sprays each nostril daily. Disp: #1 with 1 rf. If not enough can try a dose of Sudafed as well.

## 2016-07-18 MED ORDER — CETIRIZINE HCL 10 MG PO TABS: 10.0000 mg | ORAL_TABLET | Freq: Every day | ORAL | 1 refills | Status: DC

## 2016-07-18 MED ORDER — FLUTICASONE PROPIONATE 50 MCG/ACT NA SUSP: 1.0000 | Freq: Every day | NASAL | 1 refills | Status: DC

## 2016-07-18 MED FILL — FLUTICASONE PROP 50 MCG SPR: 50 | 30 days supply | Qty: 16 | Fill #0

## 2016-07-18 NOTE — Telephone Encounter (Signed)
Sent in as instructed and patient aware. 

## 2016-07-26 MED FILL — SUMATRIPTAN SUCC 50 MG TAB: 50 | 30 days supply | Qty: 9 | Fill #3

## 2016-08-10 ENCOUNTER — Other Ambulatory Visit: Payer: Self-pay

## 2016-08-10 MED ORDER — RIZATRIPTAN BENZOATE 10 MG PO TABS: 10.0000 mg | ORAL_TABLET | ORAL | 2 refills | Status: DC | PRN

## 2016-08-10 MED FILL — ESTRADIOL 2 MG TABLET: 2 | 30 days supply | Qty: 30 | Fill #2

## 2016-08-10 MED FILL — TOPIRAMATE 100 MG TABLET: 100 | 30 days supply | Qty: 60 | Fill #3

## 2016-08-10 MED FILL — RIZATRIPTAN 10 MG TABLET: 10 | 30 days supply | Qty: 9 | Fill #0

## 2016-08-23 ENCOUNTER — Other Ambulatory Visit: Payer: Self-pay | Admitting: Family Medicine

## 2016-08-24 MED FILL — ONDANSETRON HCL 4 MG TABLET: 4 | 4 days supply | Qty: 20 | Fill #0

## 2016-09-06 MED FILL — TOPIRAMATE 100 MG TABLET: 100 | 30 days supply | Qty: 60 | Fill #4

## 2016-09-06 MED FILL — SUMATRIPTAN SUCC 50 MG TAB: 50 | 30 days supply | Qty: 9 | Fill #4

## 2016-09-06 MED FILL — ESTRADIOL 2 MG TABLET: 2 | 30 days supply | Qty: 30 | Fill #3

## 2016-09-21 ENCOUNTER — Other Ambulatory Visit: Payer: Self-pay | Admitting: Family Medicine

## 2016-09-21 MED FILL — IBUPROFEN 800 MG TABLET: 800 | 7 days supply | Qty: 30 | Fill #0

## 2016-09-21 MED FILL — AMOXICILLIN 500 MG CAPSULE: 500 | 3 days supply | Qty: 12 | Fill #1

## 2016-10-02 MED FILL — traMADol HCL 50 MG TABS: 50 | 3 days supply | Qty: 12 | Fill #0

## 2016-10-09 MED FILL — ESTRADIOL 2 MG TABLET: 2 | 30 days supply | Qty: 30 | Fill #4

## 2016-10-09 MED FILL — TOPIRAMATE 100 MG TABLET: 100 | 30 days supply | Qty: 60 | Fill #5

## 2016-10-09 MED FILL — SUMATRIPTAN SUCC 50 MG TAB: 50 | 30 days supply | Qty: 9 | Fill #5

## 2016-10-09 MED FILL — RIZATRIPTAN 10 MG TABLET: 10 | 30 days supply | Qty: 9 | Fill #1

## 2016-11-02 ENCOUNTER — Other Ambulatory Visit: Payer: Self-pay | Admitting: Family Medicine

## 2016-11-02 MED FILL — ESTRADIOL 2 MG TABLET: 2 | 30 days supply | Qty: 30 | Fill #5

## 2016-11-02 MED FILL — TOPIRAMATE 100 MG TABLET: 100 | 30 days supply | Qty: 60 | Fill #0

## 2016-11-20 ENCOUNTER — Other Ambulatory Visit: Payer: Self-pay | Admitting: Family Medicine

## 2016-11-20 MED FILL — SUMATRIPTAN SUCC 50 MG TAB: 50 | 30 days supply | Qty: 9 | Fill #0

## 2016-12-04 ENCOUNTER — Other Ambulatory Visit: Payer: Self-pay | Admitting: Family Medicine

## 2016-12-04 MED FILL — TOPIRAMATE 100 MG TABLET: 100 | 30 days supply | Qty: 60 | Fill #1

## 2016-12-04 MED FILL — RIZATRIPTAN 10 MG TABLET: 10 | 30 days supply | Qty: 9 | Fill #2

## 2016-12-04 MED FILL — ESTRADIOL 2 MG TABLET: 2 | 30 days supply | Qty: 30 | Fill #0

## 2017-01-01 MED FILL — SUMATRIPTAN SUCC 50 MG TAB: 50 | 30 days supply | Qty: 9 | Fill #1

## 2017-01-01 MED FILL — ESTRADIOL 2 MG TABLET: 2 | 30 days supply | Qty: 30 | Fill #1

## 2017-01-01 MED FILL — ONDANSETRON HCL 4 MG TABLET: 4 | 4 days supply | Qty: 20 | Fill #1

## 2017-01-01 MED FILL — TOPIRAMATE 100 MG TABLET: 100 | 30 days supply | Qty: 60 | Fill #2

## 2017-01-16 ENCOUNTER — Other Ambulatory Visit: Payer: Self-pay | Admitting: Family Medicine

## 2017-01-16 MED ORDER — RIZATRIPTAN BENZOATE 10 MG PO TABS: 10.0000 mg | ORAL_TABLET | ORAL | 2 refills | Status: DC | PRN

## 2017-01-16 MED FILL — RIZATRIPTAN 10 MG TABLET: 10 | 30 days supply | Qty: 9 | Fill #0

## 2017-01-29 ENCOUNTER — Other Ambulatory Visit: Payer: Self-pay | Admitting: Family Medicine

## 2017-01-29 ENCOUNTER — Telehealth: Payer: Self-pay | Admitting: Family Medicine

## 2017-01-29 DIAGNOSIS — G43819 Other migraine, intractable, without status migrainosus: Secondary | ICD-10-CM

## 2017-01-29 MED FILL — SUMATRIPTAN SUCC 50 MG TAB: 50 | 30 days supply | Qty: 9 | Fill #2

## 2017-01-29 MED FILL — TOPIRAMATE 100 MG TABLET: 100 | 30 days supply | Qty: 60 | Fill #3

## 2017-01-29 MED FILL — KETOROLAC 10 MG TABLET: 10 | 2 days supply | Qty: 5 | Fill #0

## 2017-01-29 MED FILL — ESTRADIOL 2 MG TABLET: 2 | 30 days supply | Qty: 30 | Fill #2

## 2017-01-29 NOTE — Telephone Encounter (Signed)
OK to refill her Toradol, same number, same sig

## 2017-01-29 NOTE — Telephone Encounter (Signed)
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

## 2017-01-30 MED ORDER — KETOROLAC TROMETHAMINE 10 MG PO TABS: 10.0000 mg | ORAL_TABLET | Freq: Four times a day (QID) | ORAL | 0 refills | Status: DC | PRN

## 2017-01-30 NOTE — Telephone Encounter (Signed)
Refill done.  

## 2017-02-26 MED FILL — TOPIRAMATE 100 MG TABLET: 100 | 30 days supply | Qty: 60 | Fill #4

## 2017-02-26 MED FILL — AMOXICILLIN 500 MG CAPSULE: 500 | 3 days supply | Qty: 12 | Fill #2

## 2017-02-26 MED FILL — ESTRADIOL 2 MG TABLET: 2 | 30 days supply | Qty: 30 | Fill #3

## 2017-02-26 MED FILL — SUMATRIPTAN SUCC 50 MG TAB: 50 | 30 days supply | Qty: 9 | Fill #3

## 2017-02-26 MED FILL — RIZATRIPTAN 10 MG TABLET: 10 | 30 days supply | Qty: 9 | Fill #1

## 2017-03-14 ENCOUNTER — Ambulatory Visit (INDEPENDENT_AMBULATORY_CARE_PROVIDER_SITE_OTHER): Payer: 59 | Admitting: Family Medicine

## 2017-03-14 ENCOUNTER — Encounter: Payer: Self-pay | Admitting: Family Medicine

## 2017-03-14 DIAGNOSIS — M79604 Pain in right leg: Secondary | ICD-10-CM

## 2017-03-14 NOTE — Patient Instructions (Addendum)
Your ultrasound looks good - no evidence of stress fracture or blastic tumor. This is consistent with a stress reaction (edema in the bone without frank fracture). I would recommend aircast splint (this is too large though) or cam walker for treatment - typically have to wear for 12 weeks but ok to walk with these on. Icing, tylenol as needed for pain.

## 2017-03-15 ENCOUNTER — Encounter: Payer: Self-pay | Admitting: *Deleted

## 2017-03-16 DIAGNOSIS — M79671 Pain in right foot: Secondary | ICD-10-CM | POA: Diagnosis not present

## 2017-03-19 DIAGNOSIS — M79604 Pain in right leg: Secondary | ICD-10-CM | POA: Insufficient documentation

## 2017-03-19 NOTE — Assessment & Plan Note (Signed)
classic location and focal pain for a stress fracture though ultrasound is reassuring - more consistent with a stress reaction.  We discussed imaging vs presumptive treatment - she would like to go ahead with treatment, consider imaging if not improving as expected.  Tall cam walker placed (aircast too long).  Icing, tylenol as needed.  Ambulation as tolerated.  F/u in 4 weeks if doing well.

## 2017-03-19 NOTE — Progress Notes (Signed)
PCP: Mosie Lukes, MD  Subjective:   HPI: Patient is a 51 y.o. female here for right leg pain.  Patient reports having worsening right lower leg pain for about 2 months. Pain has been getting progressively sharper, 5/10 level. Worse with walking and at nighttime. Tried tylenol, motrin, alka seltzer at nighttime. Very sore to the touch. Limping at times at work. No acute injury or trauma. Does not run for exercise. Has history of knee replacement and multiple revisions (28 total surgeries). No skin changes, numbness.  Past Medical History:  Diagnosis Date  . Acne 11/01/2015  . Asthma    childhood and rare as adult  . Family history of adverse reaction to anesthesia    " my sisiter has had Malignant hypertheria twice, I have never had that problem"; (patient had sevo and succinylcholine with no apparent complication on 62/13/08 at [facility])  . Hyperlipidemia, mixed 05/17/2016  . IBS (irritable bowel syndrome) 01/03/2015  . Migraines   . PONV (postoperative nausea and vomiting)    "difficult to wake up."  . Preventative health care 01/03/2015  . Rash and nonspecific skin eruption 11/01/2015    Current Outpatient Prescriptions on File Prior to Visit  Medication Sig Dispense Refill  . cetirizine (ZYRTEC) 10 MG tablet Take 1 tablet (10 mg total) by mouth daily. 30 tablet 1  . estradiol (ESTRACE) 2 MG tablet TAKE 1 TABLET BY MOUTH DAILY 30 tablet 5  . fluticasone (FLONASE) 50 MCG/ACT nasal spray Place 1 spray into both nostrils daily. 16 g 1  . ibuprofen (ADVIL,MOTRIN) 800 MG tablet TAKE 1 TABLET BY MOUTH AT ONSET OF MIGRAINE MAY REPEAT IN 6 HOURS AS NEEDED 30 tablet 5  . ketorolac (TORADOL) 10 MG tablet Take 1 tablet (10 mg total) by mouth every 6 (six) hours as needed. 5 tablet 0  . Multiple Vitamin (MULTIVITAMIN WITH MINERALS) TABS tablet Take 1 tablet by mouth daily.    . ondansetron (ZOFRAN) 4 MG tablet TAKE 1 TABLET (4 MG TOTAL) BY MOUTH EVERY 8 (EIGHT) HOURS AS  NEEDED FOR NAUSEA 20 tablet 2  . ranitidine (ZANTAC) 150 MG tablet Take 1 tablet (150 mg total) by mouth 2 (two) times daily. 30 tablet 5  . rizatriptan (MAXALT) 10 MG tablet Take 1 tablet (10 mg total) by mouth as needed for migraine. May repeat in 2 hours if needed 10 tablet 2  . SUMAtriptan (IMITREX) 50 MG tablet TAKE 1 TABLET BY MOUTH AT HEADACHE ONSET. MAY REPEAT IN 2 HOURS ONE TIME ONLY 9 tablet 5  . topiramate (TOPAMAX) 100 MG tablet TAKE 1 TABLET (100 MG TOTAL) BY MOUTH 2 (TWO) TIMES DAILY. 60 tablet 5   No current facility-administered medications on file prior to visit.     Past Surgical History:  Procedure Laterality Date  . ABDOMINAL HYSTERECTOMY    . APPENDECTOMY    . CHOLECYSTECTOMY N/A 10/12/2014   Procedure: LAPAROSCOPIC CHOLECYSTECTOMY WITH INTRAOPERATIVE CHOLANGIOGRAM;  Surgeon: Doreen Salvage, MD;  Location: [place];  Service: General;  Laterality: N/A;  . HEMORROIDECTOMY     young adult  . TOTAL KNEE ARTHROPLASTY     8 knee replacements, 28 surgeries on right knee    Allergies  Allergen Reactions  . Latex Anaphylaxis  . Reglan [Metoclopramide] Anxiety    Social History   Social History  . Marital status: Married    Spouse name: N/A  . Number of children: N/A  . Years of education: N/A   Occupational History  . Not  on file.   Social History Main Topics  . Smoking status: Never Smoker  . Smokeless tobacco: Never Used  . Alcohol use No  . Drug use: No  . 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 Not on file   Social History Narrative  . No narrative on file    Family History  Problem Relation Age of Onset  . Diabetes Mother   . Heart disease Mother        MI  . Arthritis Father   . COPD Father        smoker  . Hypertension Father   . Hyperlipidemia Father   . Arthritis Sister   . Arthritis Brother   . Cancer Maternal  Grandmother 45       ovarian  . Arthritis Paternal Grandmother   . Heart disease Paternal Grandfather        MI    BP 126/84   Pulse 76   Ht 5' (1.524 m)   Wt 126 lb (57.2 kg)   BMI 24.61 kg/m   Review of Systems: See HPI above.     Objective:  Physical Exam:  Gen: NAD, comfortable in exam room  Right lower leg: Trace pitting edema.  No gross deformity, bruising. TTP medial tibia focally about 2/3rds of the way distal from the knee.   FROM knee and ankle with 5/5 strength and no pain. Unable to do hop test due to pain. NVI distally.  Left lower leg: FROM ankle and knee without pain.  No tenderness tibia.  MSK u/s:  No cortical irregularity, edema, neovascularity of tibia.  No mass.   Assessment & Plan:  1. Right lower leg pain - classic location and focal pain for a stress fracture though ultrasound is reassuring - more consistent with a stress reaction.  We discussed imaging vs presumptive treatment - she would like to go ahead with treatment, consider imaging if not improving as expected.  Tall cam walker placed (aircast too long).  Icing, tylenol as needed.  Ambulation as tolerated.  F/u in 4 weeks if doing well.

## 2017-03-26 MED FILL — TOPIRAMATE 100 MG TABLET: 100 | 30 days supply | Qty: 60 | Fill #5

## 2017-03-26 MED FILL — ESTRADIOL 2 MG TABLET: 2 | 30 days supply | Qty: 30 | Fill #4

## 2017-03-26 MED FILL — ONDANSETRON HCL 4 MG TABLET: 4 | 4 days supply | Qty: 20 | Fill #2

## 2017-03-26 MED FILL — SUMATRIPTAN SUCC 50 MG TABL: 50 | 30 days supply | Qty: 9 | Fill #4

## 2017-03-26 MED FILL — RIZATRIPTAN 10 MG TABLET: 10 | 30 days supply | Qty: 9 | Fill #2

## 2017-04-24 ENCOUNTER — Other Ambulatory Visit: Payer: Self-pay | Admitting: Family Medicine

## 2017-04-24 MED FILL — TOPIRAMATE 100 MG TABLET: 100 | 30 days supply | Qty: 60 | Fill #0

## 2017-04-24 MED FILL — ESTRADIOL 2 MG TABLET: 2 | 30 days supply | Qty: 30 | Fill #5

## 2017-04-24 MED FILL — SUMATRIPTAN SUCC 50 MG TABL: 50 | 30 days supply | Qty: 9 | Fill #5

## 2017-05-03 ENCOUNTER — Encounter: Payer: Self-pay | Admitting: Family Medicine

## 2017-05-03 ENCOUNTER — Ambulatory Visit (INDEPENDENT_AMBULATORY_CARE_PROVIDER_SITE_OTHER): Payer: 59 | Admitting: Family Medicine

## 2017-05-03 ENCOUNTER — Ambulatory Visit (HOSPITAL_BASED_OUTPATIENT_CLINIC_OR_DEPARTMENT_OTHER)
Admission: RE | Admit: 2017-05-03 | Discharge: 2017-05-03 | Disposition: A | Discharge status: Home / Self Care | Payer: 59 | Source: Ambulatory Visit | Attending: Family Medicine | Admitting: Family Medicine

## 2017-05-03 VITALS — BP 111/59 | HR 72 | Temp 98.4°F | Resp 18 | Wt 121.4 lb

## 2017-05-03 DIAGNOSIS — G43909 Migraine, unspecified, not intractable, without status migrainosus: Secondary | ICD-10-CM

## 2017-05-03 DIAGNOSIS — Z1231 Encounter for screening mammogram for malignant neoplasm of breast: Secondary | ICD-10-CM | POA: Diagnosis not present

## 2017-05-03 DIAGNOSIS — Z1239 Encounter for other screening for malignant neoplasm of breast: Secondary | ICD-10-CM

## 2017-05-03 DIAGNOSIS — E782 Mixed hyperlipidemia: Secondary | ICD-10-CM

## 2017-05-03 LAB — CBC
HCT: 40.6 % (ref 36.0–46.0)
Hemoglobin: 13.7 g/dL (ref 12.0–15.0)
MCHC: 33.8 g/dL (ref 30.0–36.0)
MCV: 95.7 fl (ref 78.0–100.0)
Platelets: 249 10*3/uL (ref 150.0–400.0)
RBC: 4.24 Mil/uL (ref 3.87–5.11)
RDW: 12.6 % (ref 11.5–15.5)
WBC: 7.4 10*3/uL (ref 4.0–10.5)

## 2017-05-03 LAB — COMPREHENSIVE METABOLIC PANEL
ALBUMIN: 4 g/dL (ref 3.5–5.2)
ALK PHOS: 52 U/L (ref 39–117)
ALT: 22 U/L (ref 0–35)
AST: 24 U/L (ref 0–37)
BILIRUBIN TOTAL: 0.4 mg/dL (ref 0.2–1.2)
BUN: 18 mg/dL (ref 6–23)
CALCIUM: 9.1 mg/dL (ref 8.4–10.5)
CO2: 24 mEq/L (ref 19–32)
CREATININE: 0.74 mg/dL (ref 0.40–1.20)
Chloride: 108 mEq/L (ref 96–112)
GFR: 87.47 mL/min (ref >60.00)
Glucose, Bld: 84 mg/dL (ref 70–99)
Potassium: 3.6 mEq/L (ref 3.5–5.1)
Sodium: 140 mEq/L (ref 135–145)
Total Protein: 6.5 g/dL (ref 6.0–8.3)

## 2017-05-03 LAB — LIPID PANEL
CHOLESTEROL: 186 mg/dL (ref 0–200)
HDL: 69.8 mg/dL (ref >39.00)
LDL CALC: 101 mg/dL — AB (ref 0–99)
NonHDL: 116.2
TRIGLYCERIDES: 77 mg/dL (ref 0.0–149.0)
Total CHOL/HDL Ratio: 3
VLDL: 15.4 mg/dL (ref 0.0–40.0)

## 2017-05-03 LAB — TSH: TSH: 1.41 u[IU]/mL (ref 0.35–4.50)

## 2017-05-03 MED ORDER — TOPIRAMATE 100 MG PO TABS: 100.0000 mg | ORAL_TABLET | Freq: Two times a day (BID) | ORAL | 5 refills | Status: DC

## 2017-05-03 MED ORDER — ESTRADIOL 2 MG PO TABS: 2.0000 mg | ORAL_TABLET | Freq: Every day | ORAL | 5 refills | Status: DC

## 2017-05-03 MED ORDER — SUMATRIPTAN SUCCINATE 50 MG PO TABS
ORAL_TABLET | ORAL | 5 refills | Status: DC
Start: 2017-05-03 — End: 2017-12-14

## 2017-05-03 MED ORDER — RIZATRIPTAN BENZOATE 10 MG PO TABS: 10.0000 mg | ORAL_TABLET | ORAL | 5 refills | Status: DC | PRN

## 2017-05-03 MED FILL — RIZATRIPTAN 10 MG TABLET: 10 | 30 days supply | Qty: 9 | Fill #0

## 2017-05-03 NOTE — Assessment & Plan Note (Addendum)
Has done a good job of managing her migraines recently. Only trouble when a storm passes through. She notes she does better when she minimizes yellow light and uses blue lights so she is thinking about getting special glasses with a blue tint. Imitrex does well and 1/2 of the Rizatriptan helps also. Never takes them on the same day. Has a twitch occasionally on the upper lid on the right eye. Encouraged increased hydration and magnesium daily report worsening symptoms

## 2017-05-03 NOTE — Progress Notes (Signed)
Subjective:  I acted as a Education administrator for Dr. Charlett Blake. [place], [place]  Patient ID: Alexandra Cabrera, female    DOB: 05-05-1966, 51 y.o.   MRN: [id number]  No chief complaint on file.   HPI  Patient is in today for a medication refill. Patient has no acute concerns today. .No recent febrile illness or acute hospitalizations. Denies CP/palp/SOB/congestion/fevers/GI or GU c/o. Taking meds as prescribed. Patient is continuing to have migraines but are less often and managed with meds. Notes some twitching from her right eyelid no trouble with left eye. Suffered a tib/fib fracture since last visit but no other recent febrile illness or hospitalizations.    Patient Care Team: Mosie Lukes, MD as PCP - General (Family Medicine) Vernie Ammons, MD as Referring Physician (Dermatology)   Past Medical History:  Diagnosis Date  . Acne 11/01/2015  . Asthma    childhood and rare as adult  . Family history of adverse reaction to anesthesia    " my sisiter has had Malignant hypertheria twice, I have never had that problem"; (patient had sevo and succinylcholine with no apparent complication on 54/62/70 at [facility])  . Hyperlipidemia, mixed 05/17/2016  . IBS (irritable bowel syndrome) 01/03/2015  . Migraines   . PONV (postoperative nausea and vomiting)    "difficult to wake up."  . Preventative health care 01/03/2015  . Rash and nonspecific skin eruption 11/01/2015    Past Surgical History:  Procedure Laterality Date  . ABDOMINAL HYSTERECTOMY    . APPENDECTOMY    . CHOLECYSTECTOMY N/A 10/12/2014   Procedure: LAPAROSCOPIC CHOLECYSTECTOMY WITH INTRAOPERATIVE CHOLANGIOGRAM;  Surgeon: Doreen Salvage, MD;  Location: [place];  Service: General;  Laterality: N/A;  . HEMORROIDECTOMY     young adult  . TOTAL KNEE ARTHROPLASTY     8 knee replacements, 28 surgeries on right knee    Family History  Problem Relation Age of Onset  . Diabetes Mother   . Heart disease Mother        MI  . Arthritis Father    . COPD Father        smoker  . Hypertension Father   . Hyperlipidemia Father   . Arthritis Sister   . Arthritis Brother   . Cancer Maternal Grandmother 45       ovarian  . Arthritis Paternal Grandmother   . Heart disease Paternal Grandfather        MI    Social History   Social History  . Marital status: Married    Spouse name: N/A  . Number of children: N/A  . Years of education: N/A   Occupational History  . Not on file.   Social History Main Topics  . Smoking status: Never Smoker  . Smokeless tobacco: Never Used  . Alcohol use No  . Drug use: No  . 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 Not on file   Social History Narrative  . No narrative on file    Outpatient Medications Prior to Visit  Medication Sig Dispense Refill  . cetirizine (ZYRTEC) 10 MG tablet Take 1 tablet (10 mg total) by mouth daily. 30 tablet 1  . ibuprofen (ADVIL,MOTRIN) 800 MG tablet TAKE 1 TABLET BY MOUTH AT ONSET OF MIGRAINE MAY REPEAT IN 6 HOURS AS NEEDED 30 tablet 5  . Multiple Vitamin (MULTIVITAMIN WITH MINERALS)  TABS tablet Take 1 tablet by mouth daily.    . ondansetron (ZOFRAN) 4 MG tablet TAKE 1 TABLET (4 MG TOTAL) BY MOUTH EVERY 8 (EIGHT) HOURS AS NEEDED FOR NAUSEA 20 tablet 2  . estradiol (ESTRACE) 2 MG tablet TAKE 1 TABLET BY MOUTH DAILY 30 tablet 5  . fluticasone (FLONASE) 50 MCG/ACT nasal spray Place 1 spray into both nostrils daily. 16 g 1  . ketorolac (TORADOL) 10 MG tablet Take 1 tablet (10 mg total) by mouth every 6 (six) hours as needed. 5 tablet 0  . Meth-Hyo-M Bl-Na Phos-Ph Sal (URIBEL) 118 MG CAPS Take by mouth.    . ranitidine (ZANTAC) 150 MG tablet Take 1 tablet (150 mg total) by mouth 2 (two) times daily. 30 tablet 5  . rizatriptan (MAXALT) 10 MG tablet Take 1 tablet (10 mg total) by mouth as needed for migraine. May repeat in 2 hours if needed  10 tablet 2  . SUMAtriptan (IMITREX) 50 MG tablet TAKE 1 TABLET BY MOUTH AT HEADACHE ONSET. MAY REPEAT IN 2 HOURS ONE TIME ONLY 9 tablet 5  . topiramate (TOPAMAX) 100 MG tablet TAKE 1 TABLET (100 MG TOTAL) BY MOUTH 2 (TWO) TIMES DAILY. 60 tablet 0   No facility-administered medications prior to visit.     Allergies  Allergen Reactions  . Latex Anaphylaxis  . Reglan [Metoclopramide] Anxiety    Review of Systems  Constitutional: Negative for fever and malaise/fatigue.  HENT: Negative for congestion.   Eyes: Negative for blurred vision.  Respiratory: Negative for shortness of breath.   Cardiovascular: Negative for chest pain, palpitations and leg swelling.  Gastrointestinal: Negative for abdominal pain, blood in stool and nausea.  Genitourinary: Negative for dysuria and frequency.  Musculoskeletal: Positive for myalgias. Negative for falls.  Skin: Negative for rash.  Neurological: Positive for headaches. Negative for dizziness and loss of consciousness.  Endo/Heme/Allergies: Negative for environmental allergies.  Psychiatric/Behavioral: Negative for depression. The patient is not nervous/anxious.        Objective:    Physical Exam  BP (!) 111/59 (BP Location: Left Arm, Patient Position: Sitting, Cuff Size: Normal)   Pulse 72   Temp 98.4 F (36.9 C) (Oral)   Resp 18   Wt 121 lb 6.4 oz (55.1 kg)   SpO2 100%   BMI 23.71 kg/m  Wt Readings from Last 3 Encounters:  05/03/17 121 lb 6.4 oz (55.1 kg)  03/14/17 126 lb (57.2 kg)  05/11/16 118 lb 2 oz (53.6 kg)   BP Readings from Last 3 Encounters:  05/03/17 (!) 111/59  03/14/17 126/84  05/11/16 108/78     Immunization History  Administered Date(s) Administered  . Influenza,inj,Quad PF,6+ Mos 04/20/2014  . Influenza-Unspecified 04/10/2016    Health Maintenance  Topic Date Due  . HIV Screening  12/18/1979  . COLONOSCOPY  12/18/2014  . MAMMOGRAM  05/03/2018  . TETANUS/TDAP  05/04/2020  . INFLUENZA VACCINE  Addressed     Lab Results  Component Value Date   WBC 7.4 05/03/2017   HGB 13.7 05/03/2017   HCT 40.6 05/03/2017   PLT 249.0 05/03/2017   GLUCOSE 84 05/03/2017   CHOL 186 05/03/2017   TRIG 77.0 05/03/2017   HDL 69.80 05/03/2017   LDLCALC 101 (H) 05/03/2017   ALT 22 05/03/2017   AST 24 05/03/2017   NA 140 05/03/2017   K 3.6 05/03/2017   CL 108 05/03/2017   CREATININE 0.74 05/03/2017   BUN 18 05/03/2017   CO2 24 05/03/2017   TSH 1.41  05/03/2017    Lab Results  Component Value Date   TSH 1.41 05/03/2017   Lab Results  Component Value Date   WBC 7.4 05/03/2017   HGB 13.7 05/03/2017   HCT 40.6 05/03/2017   MCV 95.7 05/03/2017   PLT 249.0 05/03/2017   Lab Results  Component Value Date   NA 140 05/03/2017   K 3.6 05/03/2017   CO2 24 05/03/2017   GLUCOSE 84 05/03/2017   BUN 18 05/03/2017   CREATININE 0.74 05/03/2017   BILITOT 0.4 05/03/2017   ALKPHOS 52 05/03/2017   AST 24 05/03/2017   ALT 22 05/03/2017   PROT 6.5 05/03/2017   ALBUMIN 4.0 05/03/2017   CALCIUM 9.1 05/03/2017   ANIONGAP 6 10/12/2014   GFR 87.47 05/03/2017   Lab Results  Component Value Date   CHOL 186 05/03/2017   Lab Results  Component Value Date   HDL 69.80 05/03/2017   Lab Results  Component Value Date   LDLCALC 101 (H) 05/03/2017   Lab Results  Component Value Date   TRIG 77.0 05/03/2017   Lab Results  Component Value Date   CHOLHDL 3 05/03/2017   No results found for: HGBA1C       Assessment & Plan:   Problem List Items Addressed This Visit    Migraine    Has done a good job of managing her migraines recently. Only trouble when a storm passes through. She notes she does better when she minimizes yellow light and uses blue lights so she is thinking about getting special glasses with a blue tint. Imitrex does well and 1/2 of the Rizatriptan helps also. Never takes them on the same day. Has a twitch occasionally on the upper lid on the right eye. Encouraged increased hydration and  magnesium daily report worsening symptoms      Relevant Medications   rizatriptan (MAXALT) 10 MG tablet   SUMAtriptan (IMITREX) 50 MG tablet   topiramate (TOPAMAX) 100 MG tablet   Other Relevant Orders   Comprehensive metabolic panel (Completed)   CBC (Completed)   TSH (Completed)   Hyperlipidemia, mixed    Encouraged heart healthy diet, increase exercise, avoid trans fats, consider a krill oil cap daily      Relevant Orders   Comprehensive metabolic panel (Completed)   Lipid panel (Completed)    Other Visit Diagnoses    Breast cancer screening    -  Primary   Relevant Orders   MM SCREENING BREAST TOMO BILATERAL (Completed)      I have discontinued Ms. Simenson's ranitidine, fluticasone, ketorolac, and URIBEL. I have also changed her estradiol. Additionally, I am having her maintain her multivitamin with minerals, cetirizine, ondansetron, ibuprofen, rizatriptan, SUMAtriptan, and topiramate.  Meds ordered this encounter  Medications  . estradiol (ESTRACE) 2 MG tablet    Sig: Take 1 tablet (2 mg total) by mouth daily.    Dispense:  30 tablet    Refill:  5  . rizatriptan (MAXALT) 10 MG tablet    Sig: Take 1 tablet (10 mg total) by mouth as needed for migraine. May repeat in 2 hours if needed    Dispense:  10 tablet    Refill:  5  . SUMAtriptan (IMITREX) 50 MG tablet    Sig: TAKE 1 TABLET BY MOUTH AT HEADACHE ONSET. MAY REPEAT IN 2 HOURS ONE TIME ONLY    Dispense:  9 tablet    Refill:  5  . topiramate (TOPAMAX) 100 MG tablet    Sig: Take  1 tablet (100 mg total) by mouth 2 (two) times daily.    Dispense:  60 tablet    Refill:  5    CMA served as scribe during this visit. History, Physical and Plan performed by medical provider. Documentation and orders reviewed and attested to.  Penni Homans, MD

## 2017-05-03 NOTE — Patient Instructions (Addendum)
64 oz of clear fluids daily Recommend calcium intake of 1200 to 1500 mg daily, divided into roughly 3 doses. Best source is the diet and a single dairy serving is about 500 mg, a supplement of calcium citrate once or twice daily to balance diet is fine if not getting enough in diet. Also need Vitamin D 2000 IU caps, 1 cap daily if not already taking vitamin D. Also recommend weight baring exercise on hips and upper body to keep bones strong Migraine Headache A migraine headache is a very strong throbbing pain on one side or both sides of your head. Migraines can also cause other symptoms. Talk with your doctor about what things may bring on (trigger) your migraine headaches. Follow these instructions at home: Medicines  Take over-the-counter and prescription medicines only as told by your doctor.  Do not drive or use heavy machinery while taking prescription pain medicine.  To prevent or treat constipation while you are taking prescription pain medicine, your doctor may recommend that you: ? Drink enough fluid to keep your pee (urine) clear or pale yellow. ? Take over-the-counter or prescription medicines. ? Eat foods that are high in fiber. These include fresh fruits and vegetables, whole grains, and beans. ? Limit foods that are high in fat and processed sugars. These include fried and sweet foods. Lifestyle  Avoid alcohol.  Do not use any products that contain nicotine or tobacco, such as cigarettes and e-cigarettes. If you need help quitting, ask your doctor.  Get at least 8 hours of sleep every night.  Limit your stress. General instructions   Keep a journal to find out what may bring on your migraines. For example, write down: ? What you eat and drink. ? How much sleep you get. ? Any change in what you eat or drink. ? Any change in your medicines.  If you have a migraine: ? Avoid things that make your symptoms worse, such as bright lights. ? It may help to lie down in a  dark, quiet room. ? Do not drive or use heavy machinery. ? Ask your doctor what activities are safe for you.  Keep all follow-up visits as told by your doctor. This is important. Contact a doctor if:  You get a migraine that is different or worse than your usual migraines. Get help right away if:  Your migraine gets very bad.  You have a fever.  You have a stiff neck.  You have trouble seeing.  Your muscles feel weak or like you cannot control them.  You start to lose your balance a lot.  You start to have trouble walking.  You pass out (faint). This information is not intended to replace advice given to you by your health care provider. Make sure you discuss any questions you have with your health care provider. Document Released: 04/25/2008 Document Revised: 02/04/2016 Document Reviewed: 01/03/2016 Elsevier Interactive Patient Education  2017 [employer].

## 2017-05-03 NOTE — Assessment & Plan Note (Signed)
Encouraged heart healthy diet, increase exercise, avoid trans fats, consider a krill oil cap daily 

## 2017-05-17 ENCOUNTER — Telehealth: Payer: Self-pay | Admitting: *Deleted

## 2017-05-17 NOTE — Telephone Encounter (Signed)
Received Cologuard Order Cancellation: [id number]; order has been Cancelled because it has been Inactive, and has exceeded the 365 days from the Initial order; forwarded to provider/SLS 10/18

## 2017-05-31 MED FILL — ZOVIRAX 5% CREAM: 5 | 30 days supply | Qty: 5 | Fill #0

## 2017-06-01 MED FILL — IBUPROFEN 800 MG TABS: 800 | 7 days supply | Qty: 30 | Fill #1

## 2017-06-01 MED FILL — ESTRADIOL 2 MG TABLET: 2 | 30 days supply | Qty: 30 | Fill #0

## 2017-06-01 MED FILL — SUMATRIPTAN SUCC 50 MG TABL: 50 | 30 days supply | Qty: 9 | Fill #0

## 2017-06-01 MED FILL — RIZATRIPTAN 10 MG TABLET: 10 | 30 days supply | Qty: 9 | Fill #1

## 2017-06-01 MED FILL — TOPIRAMATE 100 MG TABLET: 100 | 30 days supply | Qty: 60 | Fill #0

## 2017-06-28 MED FILL — ESTRADIOL 2 MG TABLET: 2 | 30 days supply | Qty: 30 | Fill #1

## 2017-06-28 MED FILL — TOPIRAMATE 100 MG TABLET: 100 | 30 days supply | Qty: 60 | Fill #1

## 2017-07-16 ENCOUNTER — Other Ambulatory Visit: Payer: Self-pay | Admitting: Family Medicine

## 2017-07-16 MED FILL — SUMATRIPTAN SUCC 50 MG TABL: 50 | 30 days supply | Qty: 9 | Fill #1

## 2017-07-16 MED FILL — RIZATRIPTAN 10 MG TABLET: 10 | 30 days supply | Qty: 9 | Fill #2

## 2017-07-16 MED FILL — ONDANSETRON HCL 4 MG TABLET: 4 | 6 days supply | Qty: 20 | Fill #0

## 2017-07-30 MED FILL — TOPIRAMATE 100 MG TABLET: 100 | 30 days supply | Qty: 60 | Fill #2

## 2017-07-30 MED FILL — ESTRADIOL 2 MG TABLET: 2 | 30 days supply | Qty: 30 | Fill #2

## 2017-08-14 ENCOUNTER — Other Ambulatory Visit: Payer: Self-pay

## 2017-08-14 ENCOUNTER — Encounter (HOSPITAL_BASED_OUTPATIENT_CLINIC_OR_DEPARTMENT_OTHER): Payer: Self-pay | Admitting: Emergency Medicine

## 2017-08-14 ENCOUNTER — Emergency Department (HOSPITAL_BASED_OUTPATIENT_CLINIC_OR_DEPARTMENT_OTHER): Payer: 59

## 2017-08-14 ENCOUNTER — Emergency Department (HOSPITAL_BASED_OUTPATIENT_CLINIC_OR_DEPARTMENT_OTHER)
Admission: EM | Admit: 2017-08-14 | Discharge: 2017-08-14 | Disposition: A | Discharge status: Home / Self Care | Payer: 59 | Attending: Emergency Medicine | Admitting: Emergency Medicine

## 2017-08-14 DIAGNOSIS — Z91040 Latex allergy status: Secondary | ICD-10-CM | POA: Insufficient documentation

## 2017-08-14 DIAGNOSIS — J45909 Unspecified asthma, uncomplicated: Secondary | ICD-10-CM | POA: Insufficient documentation

## 2017-08-14 DIAGNOSIS — Z96659 Presence of unspecified artificial knee joint: Secondary | ICD-10-CM | POA: Diagnosis not present

## 2017-08-14 DIAGNOSIS — R079 Chest pain, unspecified: Secondary | ICD-10-CM | POA: Insufficient documentation

## 2017-08-14 DIAGNOSIS — Z79899 Other long term (current) drug therapy: Secondary | ICD-10-CM | POA: Diagnosis not present

## 2017-08-14 LAB — BASIC METABOLIC PANEL
Anion gap: 7 (ref 5–15)
BUN: 23 mg/dL — AB (ref 6–20)
CALCIUM: 9.4 mg/dL (ref 8.9–10.3)
CHLORIDE: 110 mmol/L (ref 101–111)
CO2: 23 mmol/L (ref 22–32)
CREATININE: 1.03 mg/dL — AB (ref 0.44–1.00)
GFR calc non Af Amer: >60 mL/min (ref >60)
GLUCOSE: 98 mg/dL (ref 65–99)
Potassium: 4.6 mmol/L (ref 3.5–5.1)
Sodium: 140 mmol/L (ref 135–145)

## 2017-08-14 LAB — CBC
HCT: 40.6 % (ref 36.0–46.0)
Hemoglobin: 13.9 g/dL (ref 12.0–15.0)
MCH: 32 pg (ref 26.0–34.0)
MCHC: 34.2 g/dL (ref 30.0–36.0)
MCV: 93.5 fL (ref 78.0–100.0)
PLATELETS: 274 10*3/uL (ref 150–400)
RBC: 4.34 MIL/uL (ref 3.87–5.11)
RDW: 12.8 % (ref 11.5–15.5)
WBC: 8.4 10*3/uL (ref 4.0–10.5)

## 2017-08-14 LAB — TROPONIN I: Troponin I: <0.03 ng/mL (ref <0.03)

## 2017-08-14 LAB — D-DIMER, QUANTITATIVE: D-Dimer, Quant: <0.27 ug/mL-FEU (ref 0.00–0.50)

## 2017-08-14 MED ORDER — ASPIRIN 81 MG PO CHEW
324.0000 mg | CHEWABLE_TABLET | Freq: Once | ORAL | Status: AC
  Administered 2017-08-14: 324 mg via ORAL
  Filled 2017-08-14: qty 4

## 2017-08-14 MED ORDER — SODIUM CHLORIDE 0.9 % IV BOLUS (SEPSIS)
1000.0000 mL | Freq: Once | INTRAVENOUS | Status: AC
  Administered 2017-08-14: 1000 mL via INTRAVENOUS

## 2017-08-14 NOTE — ED Triage Notes (Addendum)
Patient was at work and started to have pain to her left arm and warm all over, patient unable to communicate with another co - worker because she was in pain and worried  - patient states that she had pain to her chest about 30 minutes ago when she was at a patients bedside. The patient is A/o x 4 and holding her left rib region

## 2017-08-14 NOTE — ED Provider Notes (Signed)
[facility] EMERGENCY DEPARTMENT Provider Note   CSN: [id number] Arrival date & time: 08/14/17  1450     History   Chief Complaint Chief Complaint  Patient presents with  . Chest Pain    HPI Alexandra Cabrera is a 52 y.o. female.  52 year old female with past medical history below including hyperlipidemia, migraines, IBS who presents with chest pain.  I was actually working with the patient here in the ED when she relatively suddenly began complaining of left-sided chest pain with radiation down her left arm and left arm numbness as well as back pain.  She became diaphoretic and nauseated.  Symptoms passed after approximately 10 minutes without any medications.  She reports that she had a milder episode of left-sided chest pain earlier today that lasted a few minutes and resolved.  She had some mild associated shortness of breath.  Pain is worse with deep inspiration.  She denies any recent travel, history of blood clots, or history of cancer.  She is on estrogen.  She is a non-smoker.   The history is provided by the patient.  Chest Pain      Past Medical History:  Diagnosis Date  . Acne 11/01/2015  . Asthma    childhood and rare as adult  . Family history of adverse reaction to anesthesia    " my sisiter has had Malignant hypertheria twice, I have never had that problem"; (patient had sevo and succinylcholine with no apparent complication on 50/38/88 at [facility])  . Hyperlipidemia, mixed 05/17/2016  . IBS (irritable bowel syndrome) 01/03/2015  . Migraines   . PONV (postoperative nausea and vomiting)    "difficult to wake up."  . Preventative health care 01/03/2015  . Rash and nonspecific skin eruption 11/01/2015    Patient Active Problem List   Diagnosis Date Noted  . Right leg pain 03/19/2017  . Hyperlipidemia, mixed 05/17/2016  . Acne 11/01/2015  . Rash and nonspecific skin eruption 11/01/2015  . IBS (irritable bowel syndrome) 01/03/2015  .  Preventative health care 01/03/2015  . S/P hysterectomy 09/09/2012  . Menopause 09/09/2012  . Hot flushes, perimenopausal 09/09/2012  . Family history of breast cancer in female 09/09/2012  . Chronic interstitial cystitis 08/10/2011  . Migraine 09/24/2007  . ENDOMETRIOSIS 09/24/2007  . ARTHRITIS 09/24/2007  . NEPHROLITHIASIS, HX OF 09/24/2007    Past Surgical History:  Procedure Laterality Date  . ABDOMINAL HYSTERECTOMY    . APPENDECTOMY    . CHOLECYSTECTOMY N/A 10/12/2014   Procedure: LAPAROSCOPIC CHOLECYSTECTOMY WITH INTRAOPERATIVE CHOLANGIOGRAM;  Surgeon: Doreen Salvage, MD;  Location: [facility];  Service: General;  Laterality: N/A;  . HEMORROIDECTOMY     young adult  . TOTAL KNEE ARTHROPLASTY     8 knee replacements, 28 surgeries on right knee    OB History    No data available       Home Medications    Prior to Admission medications   Medication Sig Start Date End Date Taking? Authorizing Provider  cetirizine (ZYRTEC) 10 MG tablet Take 1 tablet (10 mg total) by mouth daily. 07/18/16   Mosie Lukes, MD  estradiol (ESTRACE) 2 MG tablet Take 1 tablet (2 mg total) by mouth daily. 05/03/17   Mosie Lukes, MD  ibuprofen (ADVIL,MOTRIN) 800 MG tablet TAKE 1 TABLET BY MOUTH AT ONSET OF MIGRAINE MAY REPEAT IN 6 HOURS AS NEEDED 09/21/16   Mosie Lukes, MD  Multiple Vitamin (MULTIVITAMIN WITH MINERALS) TABS tablet Take 1 tablet by mouth daily.  [provider]  ondansetron (ZOFRAN) 4 MG tablet TAKE 1 TABLET (4 MG TOTAL) BY MOUTH EVERY 8 HOURS AS NEEDED FOR NAUSEA 07/16/17   Mosie Lukes, MD  rizatriptan (MAXALT) 10 MG tablet Take 1 tablet (10 mg total) by mouth as needed for migraine. May repeat in 2 hours if needed 05/03/17   Mosie Lukes, MD  SUMAtriptan (IMITREX) 50 MG tablet TAKE 1 TABLET BY MOUTH AT HEADACHE ONSET. MAY REPEAT IN 2 HOURS ONE TIME ONLY 05/03/17   Mosie Lukes, MD  topiramate (TOPAMAX) 100 MG tablet Take 1 tablet (100 mg total) by mouth 2 (two)  times daily. 05/03/17   Mosie Lukes, MD    Family History Family History  Problem Relation Age of Onset  . Diabetes Mother   . Heart disease Mother        MI  . Arthritis Father   . COPD Father        smoker  . Hypertension Father   . Hyperlipidemia Father   . Arthritis Sister   . Arthritis Brother   . Cancer Maternal Grandmother 45       ovarian  . Arthritis Paternal Grandmother   . Heart disease Paternal Grandfather        MI    Social History Social History   Tobacco Use  . Smoking status: Never Smoker  . Smokeless tobacco: Never Used  Substance Use Topics  . Alcohol use: No  . Drug use: No     Allergies   Latex and Reglan [metoclopramide]   Review of Systems Review of Systems  Cardiovascular: Positive for chest pain.   All other systems reviewed and are negative except that which was mentioned in HPI   Physical Exam Updated Vital Signs BP (!) 130/94   Pulse 67   Temp 98.4 F (36.9 C) (Oral)   Resp 12   Ht 5' (1.524 m)   Wt 56.7 kg (125 lb)   SpO2 100%   BMI 24.41 kg/m   Physical Exam  Constitutional: She is oriented to person, place, and time. She appears well-developed and well-nourished. No distress.  anxious  HENT:  Head: Normocephalic and atraumatic.  Right Ear: Tympanic membrane and ear canal normal.  Left Ear: Tympanic membrane and ear canal normal.  Moist mucous membranes  Eyes: Conjunctivae are normal. Pupils are equal, round, and reactive to light.  Neck: Neck supple.  Cardiovascular: Normal rate, regular rhythm and normal heart sounds.  No murmur heard. Pulmonary/Chest: Effort normal and breath sounds normal.  Abdominal: Soft. Bowel sounds are normal. She exhibits no distension. There is no tenderness.  Musculoskeletal: She exhibits no edema.  Neurological: She is alert and oriented to person, place, and time.  Fluent speech  Skin: Skin is warm. She is diaphoretic. There is pallor.  Psychiatric: Judgment normal. Her mood  appears anxious.  Nursing note and vitals reviewed.    ED Treatments / Results  Labs (all labs ordered are listed, but only abnormal results are displayed) Labs Reviewed  BASIC METABOLIC PANEL - Abnormal; Notable for the following components:      Result Value   BUN 23 (*)    Creatinine, Ser 1.03 (*)    All other components within normal limits  CBC  TROPONIN I  D-DIMER, QUANTITATIVE (NOT AT [facility])    EKG  EKG Interpretation  Date/Time:  Tuesday August 14 2017 14:52:27 EST Ventricular Rate:  67 PR Interval:    QRS Duration: 90 QT Interval:  407 QTC Calculation: 430 R Axis:   70 Text Interpretation:  Sinus rhythm Short PR interval Minimal ST depression, inferior leads Baseline wander in lead(s) V1 V5 No significant change since last tracing Confirmed by Theotis Burrow [phone]) on 08/14/2017 2:56:44 PM       Radiology Dg Chest 2 View  Result Date: 08/14/2017 CLINICAL DATA:  52 year old female with left-sided chest pain and left shoulder pain. History of asthma. Initial encounter. EXAM: CHEST  2 VIEW COMPARISON:  10/12/2014. FINDINGS: Hyperinflated lungs with mild chronic lung changes. Biapical pleural thickening stable. No infiltrate, congestive heart failure or pneumothorax. No plain film evidence of pulmonary malignancy. Heart size within normal limits. Mild scoliosis thoracic spine without acute osseous abnormality. IMPRESSION: Hyperinflation with mild chronic lung changes. No acute pulmonary abnormality noted. Electronically Signed   By: Genia Del M.D.   On: 08/14/2017 15:15    Procedures Procedures (including critical care time)  Medications Ordered in ED Medications  aspirin chewable tablet 324 mg (324 mg Oral Given 08/14/17 1512)  sodium chloride 0.9 % bolus 1,000 mL (1,000 mLs Intravenous New Bag/Given 08/14/17 1548)     Initial Impression / Assessment and Plan / ED Course  I have reviewed the triage vital signs and the nursing notes.  Pertinent labs &  imaging results that were available during my care of the patient were reviewed by me and considered in my medical decision making (see chart for details).     Episode of CP and L arm pain/numbness began on our ED shift together, she was pale, diaphoretic, and anxious on initial exam. Sx resolved without intervention. Gave ASA and obtained above labs including d-dimer given estrogen use.  EKG reassuring, similar to previous.  Initial troponin and d-dimer negative.  Lab work overall reassuring, BUN 23 and creatinine 1.03 for which I gave her a fluid bolus.  She remains chest pain-free on reassessment.  Chest x-ray negative acute and given resolution of pain and no widened mediastinum I highly doubt aortic dissection.  We will obtain a second troponin. Her HEART score is </=3 and given pain has resolved and not returned, I feel she is appropriate for outpatient management including cardiology referral for stress test. She is comfortable with this plan. I am signing out to oncoming provider, Dr. Jeneen Rinks, who will f/u on repeat trop.   Final Clinical Impressions(s) / ED Diagnoses   Final diagnoses:  None    ED Discharge Orders    None       Omarii Scalzo, Wenda Overland, MD 08/14/17 1601

## 2017-08-14 NOTE — Discharge Instructions (Signed)
Follow-up with your primary care physician routinely, and less symptoms recur or worsen.

## 2017-08-14 NOTE — ED Notes (Signed)
Pt ambulated to restroom. 

## 2017-08-14 NOTE — ED Notes (Signed)
ED Provider at bedside. 

## 2017-08-27 MED FILL — ESTRADIOL 2 MG TABLET: 2 | 30 days supply | Qty: 30 | Fill #3

## 2017-08-27 MED FILL — TOPIRAMATE 100 MG TABLET: 100 | 30 days supply | Qty: 60 | Fill #3

## 2017-09-20 MED FILL — SUMATRIPTAN SUCC 50 MG TAB: 50 | 30 days supply | Qty: 9 | Fill #2

## 2017-09-20 MED FILL — RIZATRIPTAN BENZOATE 10 MG: 10 | 30 days supply | Qty: 9 | Fill #3

## 2017-09-27 MED FILL — ESTRADIOL 2 MG TABLET: 2 | 30 days supply | Qty: 30 | Fill #4

## 2017-09-27 MED FILL — TOPIRAMATE 100 MG TABLET: 100 | 30 days supply | Qty: 60 | Fill #4

## 2017-10-26 MED FILL — SUMATRIPTAN SUCC 50 MG TAB: 50 | 30 days supply | Qty: 9 | Fill #3

## 2017-10-26 MED FILL — RIZATRIPTAN BENZOATE 10 MG: 10 | 30 days supply | Qty: 9 | Fill #4

## 2017-10-26 MED FILL — ESTRADIOL 2 MG TABLET: 2 | 30 days supply | Qty: 30 | Fill #5

## 2017-10-26 MED FILL — TOPIRAMATE 100 MG TABLET: 100 | 30 days supply | Qty: 60 | Fill #5

## 2017-11-01 ENCOUNTER — Telehealth: Payer: Self-pay | Admitting: Family Medicine

## 2017-11-01 NOTE — Telephone Encounter (Signed)
Copied from [facility]. Topic: Quick Communication - See Telephone Encounter >> Nov 01, 2017  3:52 PM Rosalin Hawking wrote: CRM for notification. See Telephone encounter for: 11/01/17.   Pt came in office stating that fell on Saturday (tripped over her cat and got some bruises on her chin and left hand) pt would like to know if possible to get a Tetanus shot, (pt would like to know if she can get it today or tomorrow or ASAP) Please advise ASAP.

## 2017-11-05 NOTE — Telephone Encounter (Signed)
So she can have one would give a Tdap and can give today or tomorrow. Clarify if she broke the skin at all but can order for skin injury

## 2017-11-05 NOTE — Telephone Encounter (Signed)
Can you place her on the nurse schedule for a Tdap. We do not have to order until she gets here    thx

## 2017-11-05 NOTE — Telephone Encounter (Signed)
Can patient have Tetanus

## 2017-11-06 ENCOUNTER — Encounter: Payer: Self-pay | Admitting: *Deleted

## 2017-11-06 ENCOUNTER — Ambulatory Visit (INDEPENDENT_AMBULATORY_CARE_PROVIDER_SITE_OTHER): Payer: 59 | Admitting: *Deleted

## 2017-11-06 DIAGNOSIS — T148XXA Other injury of unspecified body region, initial encounter: Secondary | ICD-10-CM

## 2017-11-06 DIAGNOSIS — Z23 Encounter for immunization: Secondary | ICD-10-CM | POA: Diagnosis not present

## 2017-11-06 NOTE — Progress Notes (Signed)
Pre visit review using our clinic review tool, if applicable. No additional management support is needed unless otherwise documented below in the visit note.  Pt here for tdap vaccine per Dr Charlett Blake (11/01/17 phone note).  Tdap 0.76mL given IM, left deltoid and pt tolerated injection well.  Pt states she fell about 1 week ago and sustained abrasions to her chin and hand. Pt feels wounds are healing well but wanted to take extra precaution and get vaccine.

## 2017-11-29 ENCOUNTER — Other Ambulatory Visit: Payer: Self-pay | Admitting: Family Medicine

## 2017-11-30 MED FILL — ESTRADIOL 2 MG TABLET: 2 | 30 days supply | Qty: 30 | Fill #0

## 2017-11-30 MED FILL — TOPIRAMATE 100 MG TABLET: 100 | 30 days supply | Qty: 60 | Fill #0

## 2017-12-03 MED FILL — AMOXICILLIN 500 MG CAPSULE: 500 | 4 days supply | Qty: 16 | Fill #0

## 2017-12-03 MED FILL — SUMATRIPTAN SUCC 50 MG TAB: 50 | 30 days supply | Qty: 9 | Fill #4

## 2017-12-03 MED FILL — RIZATRIPTAN BENZOATE 10 MG: 10 | 30 days supply | Qty: 9 | Fill #5

## 2017-12-14 ENCOUNTER — Other Ambulatory Visit: Payer: Self-pay

## 2017-12-14 ENCOUNTER — Encounter: Payer: 59 | Admitting: Family Medicine

## 2017-12-14 MED ORDER — IBUPROFEN 800 MG PO TABS: ORAL_TABLET | ORAL | 5 refills | Status: DC

## 2017-12-14 MED ORDER — ESTRADIOL 2 MG PO TABS: 2.0000 mg | ORAL_TABLET | Freq: Every day | ORAL | 5 refills | Status: DC

## 2017-12-14 MED ORDER — ONDANSETRON HCL 4 MG PO TABS
ORAL_TABLET | ORAL | 2 refills | Status: DC
Start: 2017-12-14 — End: 2018-06-25

## 2017-12-14 MED ORDER — RIZATRIPTAN BENZOATE 10 MG PO TABS: 10.0000 mg | ORAL_TABLET | ORAL | 5 refills | Status: DC | PRN

## 2017-12-14 MED ORDER — CETIRIZINE HCL 10 MG PO TABS: 10.0000 mg | ORAL_TABLET | Freq: Every day | ORAL | 1 refills | Status: DC

## 2017-12-14 MED ORDER — SUMATRIPTAN SUCCINATE 50 MG PO TABS
ORAL_TABLET | ORAL | 5 refills | Status: DC
Start: 2017-12-14 — End: 2018-09-23

## 2017-12-14 MED ORDER — TOPIRAMATE 100 MG PO TABS: 100.0000 mg | ORAL_TABLET | Freq: Two times a day (BID) | ORAL | 5 refills | Status: DC

## 2017-12-14 NOTE — Telephone Encounter (Signed)
Patient needed medication sent to pharmacy

## 2017-12-28 MED FILL — ESTRADIOL 2 MG TABLET: 2 | 30 days supply | Qty: 30 | Fill #1

## 2017-12-28 MED FILL — ONDANSETRON HCL 4 MG TABLET: 4 | 6 days supply | Qty: 20 | Fill #0

## 2017-12-28 MED FILL — TOPIRAMATE 100 MG TABLET: 100 | 30 days supply | Qty: 60 | Fill #1

## 2017-12-28 MED FILL — CETIRIZINE HCL 10 MG TABLET: 10 | 100 days supply | Qty: 100 | Fill #0

## 2017-12-28 MED FILL — IBUPROFEN 800 MG TAB: 800 | 7 days supply | Qty: 30 | Fill #0

## 2018-01-07 ENCOUNTER — Ambulatory Visit (INDEPENDENT_AMBULATORY_CARE_PROVIDER_SITE_OTHER): Payer: 59 | Admitting: Family Medicine

## 2018-01-07 ENCOUNTER — Encounter: Payer: Self-pay | Admitting: Gastroenterology

## 2018-01-07 ENCOUNTER — Encounter: Payer: Self-pay | Admitting: Family Medicine

## 2018-01-07 VITALS — BP 110/76 | HR 105 | Temp 97.8°F | Ht 60.0 in | Wt 125.0 lb

## 2018-01-07 DIAGNOSIS — R5383 Other fatigue: Secondary | ICD-10-CM | POA: Diagnosis not present

## 2018-01-07 DIAGNOSIS — J302 Other seasonal allergic rhinitis: Secondary | ICD-10-CM

## 2018-01-07 DIAGNOSIS — Z0000 Encounter for general adult medical examination without abnormal findings: Secondary | ICD-10-CM

## 2018-01-07 DIAGNOSIS — Z1211 Encounter for screening for malignant neoplasm of colon: Secondary | ICD-10-CM

## 2018-01-07 LAB — COMPREHENSIVE METABOLIC PANEL
ALBUMIN: 4.1 g/dL (ref 3.5–5.2)
ALK PHOS: 58 U/L (ref 39–117)
ALT: 19 U/L (ref 0–35)
AST: 20 U/L (ref 0–37)
BUN: 17 mg/dL (ref 6–23)
CALCIUM: 9.3 mg/dL (ref 8.4–10.5)
CHLORIDE: 109 meq/L (ref 96–112)
CO2: 26 mEq/L (ref 19–32)
Creatinine, Ser: 0.82 mg/dL (ref 0.40–1.20)
GFR: 77.49 mL/min (ref >60.00)
Glucose, Bld: 90 mg/dL (ref 70–99)
POTASSIUM: 4.5 meq/L (ref 3.5–5.1)
Sodium: 142 mEq/L (ref 135–145)
TOTAL PROTEIN: 6.3 g/dL (ref 6.0–8.3)
Total Bilirubin: 0.4 mg/dL (ref 0.2–1.2)

## 2018-01-07 LAB — LIPID PANEL
CHOL/HDL RATIO: 3
Cholesterol: 212 mg/dL — ABNORMAL HIGH (ref 0–200)
HDL: 64.3 mg/dL (ref >39.00)
LDL Cholesterol: 120 mg/dL — ABNORMAL HIGH (ref 0–99)
NonHDL: 147.43
TRIGLYCERIDES: 136 mg/dL (ref 0.0–149.0)
VLDL: 27.2 mg/dL (ref 0.0–40.0)

## 2018-01-07 LAB — CBC
HCT: 41.3 % (ref 36.0–46.0)
HEMOGLOBIN: 14 g/dL (ref 12.0–15.0)
MCHC: 33.7 g/dL (ref 30.0–36.0)
MCV: 95.9 fl (ref 78.0–100.0)
PLATELETS: 281 10*3/uL (ref 150.0–400.0)
RBC: 4.31 Mil/uL (ref 3.87–5.11)
RDW: 12.5 % (ref 11.5–15.5)
WBC: 6.2 10*3/uL (ref 4.0–10.5)

## 2018-01-07 LAB — TSH: TSH: 1.97 u[IU]/mL (ref 0.35–4.50)

## 2018-01-07 MED ORDER — MONTELUKAST SODIUM 10 MG PO TABS
10.0000 mg | ORAL_TABLET | Freq: Every day | ORAL | 3 refills | Status: DC
Start: 2018-01-07 — End: 2020-01-06

## 2018-01-07 MED FILL — MONTELUKAST SOD 10 MG TAB: 10 | 30 days supply | Qty: 30 | Fill #0

## 2018-01-07 NOTE — Progress Notes (Signed)
Chief Complaint  Patient presents with  . Annual Exam     Well Woman Alexandra Cabrera is here for a complete physical.   Her last physical was >1 year ago.  Current diet: in general, a "healthy" diet. Current exercise: has not been exercising as much. Weight is gaining and she confirms fatigue. No LMP recorded. Patient has had a hysterectomy..  Seatbelt? Yes  Health Maintenance Pap/HPV- No cervix Mammogram- Yes Tetanus- Yes Colonoscopy- No HIV screening- Yes - 01/07/2002  Past Medical History:  Diagnosis Date  . Acne 11/01/2015  . Asthma    childhood and rare as adult  . Family history of adverse reaction to anesthesia    " my sisiter has had Malignant hypertheria twice, I have never had that problem"; (patient had sevo and succinylcholine with no apparent complication on 54/09/81 at [facility])  . Hyperlipidemia, mixed 05/17/2016  . IBS (irritable bowel syndrome) 01/03/2015  . Migraines   . PONV (postoperative nausea and vomiting)    "difficult to wake up."  . Preventative health care 01/03/2015  . Rash and nonspecific skin eruption 11/01/2015     Past Surgical History:  Procedure Laterality Date  . ABDOMINAL HYSTERECTOMY    . APPENDECTOMY    . CHOLECYSTECTOMY N/A 10/12/2014   Procedure: LAPAROSCOPIC CHOLECYSTECTOMY WITH INTRAOPERATIVE CHOLANGIOGRAM;  Surgeon: Doreen Salvage, MD;  Location: [facility];  Service: General;  Laterality: N/A;  . HEMORROIDECTOMY     young adult  . TOTAL KNEE ARTHROPLASTY     8 knee replacements, 28 surgeries on right knee    Medications  Current Outpatient Medications on File Prior to Visit  Medication Sig Dispense Refill  . cetirizine (ZYRTEC) 10 MG tablet Take 1 tablet (10 mg total) by mouth daily. 30 tablet 1  . estradiol (ESTRACE) 2 MG tablet Take 1 tablet (2 mg total) by mouth daily. 30 tablet 5  . ibuprofen (ADVIL,MOTRIN) 800 MG tablet TAKE 1 TABLET BY MOUTH AT ONSET OF MIGRAINE MAY REPEAT IN 6 HOURS AS NEEDED 30 tablet 5  . Multiple  Vitamin (MULTIVITAMIN WITH MINERALS) TABS tablet Take 1 tablet by mouth daily.    . ondansetron (ZOFRAN) 4 MG tablet TAKE 1 TABLET (4 MG TOTAL) BY MOUTH EVERY 8 HOURS AS NEEDED FOR NAUSEA 20 tablet 2  . rizatriptan (MAXALT) 10 MG tablet Take 1 tablet (10 mg total) by mouth as needed for migraine. May repeat in 2 hours if needed 10 tablet 5  . SUMAtriptan (IMITREX) 50 MG tablet TAKE 1 TABLET BY MOUTH AT HEADACHE ONSET. MAY REPEAT IN 2 HOURS ONE TIME ONLY 9 tablet 5  . topiramate (TOPAMAX) 100 MG tablet Take 1 tablet (100 mg total) by mouth 2 (two) times daily. 60 tablet 5   Allergies Allergies  Allergen Reactions  . Latex Anaphylaxis  . Reglan [Metoclopramide] Anxiety    Review of Systems: Constitutional:  + weight gain over past couple years Eye:  no recent significant change in vision Ear/Nose/Mouth/Throat:  Ears:  no tinnitus or vertigo and no recent change in hearing Nose/Mouth/Throat:  +nasal congestion, no sore throat Cardiovascular: no chest pain Respiratory:  no cough and no shortness of breath Gastrointestinal:  no abdominal pain, no change in bowel habits GU:  Female: negative for dysuria or pelvic pain Musculoskeletal/Extremities:  no pain of the joints Integumentary (Skin/Breast):  no abnormal skin lesions reported Neurologic:  no headaches Endocrine:  + fatigue Hematologic/Lymphatic:  No areas of easy bleeding  Exam BP 110/76 (BP Location: Left Arm, Patient  Position: Sitting, Cuff Size: Normal)   Pulse (!) 105   Temp 97.8 F (36.6 C) (Oral)   Ht 5' (1.524 m)   Wt 125 lb (56.7 kg)   SpO2 96%   BMI 24.41 kg/m  General:  well developed, well nourished, in no apparent distress Skin:  no significant moles, warts, or growths Head:  no masses, lesions, or tenderness Eyes:  pupils equal and round, sclera anicteric without injection Ears:  canals without lesions, TMs shiny without retraction, no obvious effusion, no erythema Nose:  nares patent, septum midline, mucosa  normal, and no drainage or sinus tenderness Throat/Pharynx:  lips and gingiva without lesion; tongue and uvula midline; non-inflamed pharynx; no exudates or postnasal drainage Neck: neck supple without adenopathy, thyromegaly, or masses Lungs:  clear to auscultation, breath sounds equal bilaterally, no respiratory distress Cardio:  regular rate and rhythm, no bruits, no LE edema Abdomen:  abdomen soft, nontender; bowel sounds normal; no masses or organomegaly Genital: Defer to GYN Musculoskeletal:  symmetrical muscle groups noted without atrophy or deformity; decreased ROM of R knee Extremities:  no clubbing, cyanosis, or edema, no deformities, no skin discoloration Neuro:  gait normal; deep tendon reflexes normal and symmetric Psych: well oriented with normal range of affect and appropriate judgment/insight  Assessment and Plan  Well adult exam - Plan: Comprehensive metabolic panel, Lipid panel  Screen for colon cancer - Plan: Ambulatory referral to Gastroenterology  Fatigue, unspecified type - Plan: CBC, TSH  Seasonal allergies - Plan: montelukast (SINGULAIR) 10 MG tablet   Well 52 y.o. female. Counseled on diet and exercise. Other orders as above. Add Singulair to Zyrtec and INCS.  Follow up as originally scheduled with pcp. The patient voiced understanding and agreement to the plan.  [place], DO 01/07/18 8:48 AM

## 2018-01-07 NOTE — Progress Notes (Signed)
Pre visit review using our clinic review tool, if applicable. No additional management support is needed unless otherwise documented below in the visit note. 

## 2018-01-07 NOTE — Patient Instructions (Addendum)
Keep up the good work. Stay active and keep the diet clean.  2-3 days to get the results of your labs.   If you do not hear anything about your referral in the next 1-2 weeks, call our office and ask for an update.  Let us know if you need anything.

## 2018-01-17 MED FILL — RIZATRIPTAN BENZOATE 10 MG: 10 | 20 days supply | Qty: 6 | Fill #6

## 2018-01-17 MED FILL — SUMATRIPTAN SUCC 50 MG TAB: 50 | 30 days supply | Qty: 9 | Fill #5

## 2018-01-22 ENCOUNTER — Ambulatory Visit (AMBULATORY_SURGERY_CENTER): Payer: Self-pay

## 2018-01-22 ENCOUNTER — Other Ambulatory Visit: Payer: Self-pay

## 2018-01-22 VITALS — Ht 60.0 in | Wt 126.6 lb

## 2018-01-22 DIAGNOSIS — Z1211 Encounter for screening for malignant neoplasm of colon: Secondary | ICD-10-CM

## 2018-01-22 MED ORDER — NA SULFATE-K SULFATE-MG SULF 17.5-3.13-1.6 GM/177ML PO SOLN: 1.0000 | Freq: Once | ORAL | 0 refills | Status: AC

## 2018-01-22 MED FILL — SUPREP BOWEL PREP KIT: 17.5-3.13-1 | 1 days supply | Qty: 354 | Fill #0

## 2018-01-22 NOTE — Progress Notes (Signed)
Denies allergies to eggs or soy products. Denies complication of anesthesia or sedation. Denies use of weight loss medication. Denies use of O2.   Emmi instructions declined.   Patient was seen in ED on Jan. 2019 for chest pain. Dr. Rex Kras was attending physician. Patient states that the physician determined that chest pain was caused by low blood sugar. Blood sugar was 54. No further cardiac work up was performed.   Riki Sheer, LPN

## 2018-01-25 ENCOUNTER — Encounter: Payer: Self-pay | Admitting: Gastroenterology

## 2018-01-28 MED FILL — TOPIRAMATE 100 MG TABLET: 100 | 30 days supply | Qty: 60 | Fill #2

## 2018-01-28 MED FILL — ESTRADIOL 2 MG TABLET: 2 | 30 days supply | Qty: 30 | Fill #2

## 2018-02-04 ENCOUNTER — Other Ambulatory Visit: Payer: Self-pay

## 2018-02-04 ENCOUNTER — Encounter: Payer: Self-pay | Admitting: Gastroenterology

## 2018-02-04 ENCOUNTER — Ambulatory Visit (AMBULATORY_SURGERY_CENTER): Payer: 59 | Admitting: Gastroenterology

## 2018-02-04 VITALS — BP 117/77 | HR 75 | Temp 98.0°F | Resp 16 | Ht 60.0 in | Wt 125.0 lb

## 2018-02-04 DIAGNOSIS — K649 Unspecified hemorrhoids: Secondary | ICD-10-CM

## 2018-02-04 DIAGNOSIS — D128 Benign neoplasm of rectum: Secondary | ICD-10-CM | POA: Diagnosis not present

## 2018-02-04 DIAGNOSIS — Z1211 Encounter for screening for malignant neoplasm of colon: Secondary | ICD-10-CM

## 2018-02-04 DIAGNOSIS — D122 Benign neoplasm of ascending colon: Secondary | ICD-10-CM

## 2018-02-04 DIAGNOSIS — Z860100 Personal history of colon polyps, unspecified: Secondary | ICD-10-CM

## 2018-02-04 DIAGNOSIS — K5790 Diverticulosis of intestine, part unspecified, without perforation or abscess without bleeding: Secondary | ICD-10-CM

## 2018-02-04 DIAGNOSIS — D129 Benign neoplasm of anus and anal canal: Secondary | ICD-10-CM

## 2018-02-04 HISTORY — DX: Personal history of colon polyps, unspecified: Z86.0100

## 2018-02-04 HISTORY — DX: Personal history of colonic polyps: Z86.010

## 2018-02-04 HISTORY — DX: Unspecified hemorrhoids: K64.9

## 2018-02-04 HISTORY — DX: Diverticulosis of intestine, part unspecified, without perforation or abscess without bleeding: K57.90

## 2018-02-04 HISTORY — PX: COLONOSCOPY: SHX174

## 2018-02-04 MED ORDER — SODIUM CHLORIDE 0.9 % IV SOLN
500.0000 mL | Freq: Once | INTRAVENOUS | Status: DC
Start: 2018-02-04 — End: 2018-05-29

## 2018-02-04 NOTE — Op Note (Signed)
[facility] Patient Name: Alexandra Cabrera Procedure Date: 02/04/2018 1:29 PM MRN: [id number] Endoscopist: Jackquline Denmark , MD Age: 52 Referring MD:  Date of Birth: 1966/04/24 Gender: Female Account #: [id number] Procedure:                Colonoscopy Indications:              Screening for colorectal malignant neoplasm Medicines:                Monitored Anesthesia Care Procedure:                Pre-Anesthesia Assessment:                           - Prior to the procedure, a History and Physical                            was performed, and patient medications and                            allergies were reviewed. The patient is competent.                            The risks and benefits of the procedure and the                            sedation options and risks were discussed with the                            patient. All questions were answered and informed                            consent was obtained. Patient identification and                            proposed procedure were verified by the physician                            in the procedure room. Mental Status Examination:                            alert and oriented. Prophylactic Antibiotics: The                            patient does not require prophylactic antibiotics.                            Prior Anticoagulants: The patient has taken no                            previous anticoagulant or antiplatelet agents. ASA                            Grade Assessment: I - A normal, healthy patient.  After reviewing the risks and benefits, the patient                            was deemed in satisfactory condition to undergo the                            procedure. The anesthesia plan was to use monitored                            anesthesia care (MAC). Immediately prior to                            administration of medications, the patient was                            re-assessed  for adequacy to receive sedatives. The                            heart rate, respiratory rate, oxygen saturations,                            blood pressure, adequacy of pulmonary ventilation,                            and response to care were monitored throughout the                            procedure. The physical status of the patient was                            re-assessed after the procedure.                           After obtaining informed consent, the colonoscope                            was passed under direct vision. Throughout the                            procedure, the patient's blood pressure, pulse, and                            oxygen saturations were monitored continuously. The                            Colonoscope was introduced through the anus and                            advanced to the 3 cm into the ileum. The                            colonoscopy was performed without difficulty. The  patient tolerated the procedure well. The quality                            of the bowel preparation was excellent. Scope In: 1:32:56 PM Scope Out: 1:56:36 PM Scope Withdrawal Time: 0 hours 21 minutes 31 seconds  Total Procedure Duration: 0 hours 23 minutes 40 seconds  Findings:                 The perianal exam revealed small external                            hemorrhoids, evidence of previous hemorrhoidectomy                            and a posterior rectal lesion (flat sessile polyp)                           A 10 mm polyp was found in the ascending colon. The                            polyp was sessile. The polyp was removed with a hot                            snare. Resection and retrieval were complete.                           A 30 mm flat, sessile polyp was found in the rectum                            involving one fourth of the circumference, just                            above but not involving the dentate line. Biopsies                             were taken with a cold forceps for histology. The                            polyp was also examined by narrowband imaging.                            There were no ulcerations. Photodocumentation was                            obtained. It was not deemed not to be safe for                            endoscopic removal here at [facility] (by 2                            gastroenterologists- myself and Dr. Carlean Purl, who  was kind enough to come and look at it in between                            his procedures)                           A few small-mouthed diverticula were found in the                            sigmoid colon. Complications:            No immediate complications. Estimated Blood Loss:     Estimated blood loss was minimal. Impression:               - Posterior flat sessile rectal polyp (biopsied)                           - Ascending colon polyp status post polypectomy.                           - Mild sigmoid diverticulosis.                           - Small internal and external hemorrhoids with                            previous hemorrhoidectomy. Recommendation:           - Patient has a contact number available for                            emergencies. The signs and symptoms of potential                            delayed complications were discussed with the                            patient. Return to normal activities tomorrow.                            Written discharge instructions were provided to the                            patient.                           - Resume previous diet.                           - Continue present medications.                           - Await pathology results.                           - Return to GI clinic in 2 weeks. Jackquline Denmark, MD 02/04/2018 2:12:05 PM This report has been signed electronically.

## 2018-02-04 NOTE — Progress Notes (Signed)
Report given to PACU, vss 

## 2018-02-04 NOTE — Progress Notes (Signed)
Called to room to assist during endoscopic procedure.  Patient ID and intended procedure confirmed with present staff. Received instructions for my participation in the procedure from the performing physician.  

## 2018-02-04 NOTE — Progress Notes (Signed)
Right total knee replacement with metal. maw

## 2018-02-04 NOTE — Patient Instructions (Signed)
Polyp and hemorrhoid information given.  YOU HAD AN ENDOSCOPIC PROCEDURE TODAY AT [facility]:   Refer to the procedure report that was given to you for any specific questions about what was found during the examination.  If the procedure report does not answer your questions, please call your gastroenterologist to clarify.  If you requested that your care partner not be given the details of your procedure findings, then the procedure report has been included in a sealed envelope for you to review at your convenience later.  YOU SHOULD EXPECT: Some feelings of bloating in the abdomen. Passage of more gas than usual.  Walking can help get rid of the air that was put into your GI tract during the procedure and reduce the bloating. If you had a lower endoscopy (such as a colonoscopy or flexible sigmoidoscopy) you may notice spotting of blood in your stool or on the toilet paper. If you underwent a bowel prep for your procedure, you may not have a normal bowel movement for a few days.  Please Note:  You might notice some irritation and congestion in your nose or some drainage.  This is from the oxygen used during your procedure.  There is no need for concern and it should clear up in a day or so.  SYMPTOMS TO REPORT IMMEDIATELY:   Following lower endoscopy (colonoscopy or flexible sigmoidoscopy):  Excessive amounts of blood in the stool  Significant tenderness or worsening of abdominal pains  Swelling of the abdomen that is new, acute  Fever of 100F or higher    For urgent or emergent issues, a gastroenterologist can be reached at any hour by calling [phone].   DIET:  We do recommend a small meal at first, but then you may proceed to your regular diet.  Drink plenty of fluids but you should avoid alcoholic beverages for 24 hours.  ACTIVITY:  You should plan to take it easy for the rest of today and you should NOT DRIVE or use heavy machinery until tomorrow (because of the  sedation medicines used during the test).    FOLLOW UP: Our staff will call the number listed on your records the next business day following your procedure to check on you and address any questions or concerns that you may have regarding the information given to you following your procedure. If we do not reach you, we will leave a message.  However, if you are feeling well and you are not experiencing any problems, there is no need to return our call.  We will assume that you have returned to your regular daily activities without incident.  If any biopsies were taken you will be contacted by phone or by letter within the next 1-3 weeks.  Please call us at [phone] if you have not heard about the biopsies in 3 weeks.    SIGNATURES/CONFIDENTIALITY: You and/or your care partner have signed paperwork which will be entered into your electronic medical record.  These signatures attest to the fact that that the information above on your After Visit Summary has been reviewed and is understood.  Full responsibility of the confidentiality of this discharge information lies with you and/or your care-partner.

## 2018-02-04 NOTE — Progress Notes (Signed)
Pt's states no medical or surgical changes since previsit or office visit. 

## 2018-02-05 ENCOUNTER — Telehealth: Payer: Self-pay | Admitting: *Deleted

## 2018-02-05 NOTE — Telephone Encounter (Signed)
Note sent to [facility] in [facility] office for help to schedule patient.

## 2018-02-05 NOTE — Telephone Encounter (Signed)
  Follow up Call-  Call back number 02/04/2018  Post procedure Call Back phone  # [phone] cell  Permission to leave phone message Yes  Some recent data might be hidden     Patient questions:  Do you have a fever, pain , or abdominal swelling? No. Pain Score  0 *  Have you tolerated food without any problems? Yes.    Have you been able to return to your normal activities? Yes.    Do you have any questions about your discharge instructions: Diet   No. Medications  No. Follow up visit  No.  Do you have questions or concerns about your Care? No.  Actions: * If pain score is 4 or above: No action needed, pain <4.

## 2018-02-05 NOTE — Telephone Encounter (Signed)
-----   Message from Jackquline Denmark, MD sent at 02/05/2018  2:21 PM EDT ----- Lets please work her in in [facility] office with me. 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 Lyndel Safe, You requested this patient have a 2 week f/u OV post colonoscopy. Do you want her worked in at [employer] or do you want her to see a PA at the [place] office? Leone Payor, RN

## 2018-02-11 NOTE — Telephone Encounter (Signed)
Were you able to contact her to schedule an appointment?  Per the note, Dr Lyndel Safe wants her worked in to his schedule in [employer].  Happy Monday!

## 2018-02-11 NOTE — Telephone Encounter (Signed)
She has been scheduled for 02/19/2018 at 2:15pm. Patient was notified of appointment.

## 2018-02-12 ENCOUNTER — Telehealth: Payer: Self-pay | Admitting: Gastroenterology

## 2018-02-12 NOTE — Telephone Encounter (Signed)
I have discussed the colon biopsy results with the patient and further recommendations in detail.  Plan: -She would like to proceed with transanal resection as a definitive treatment for the rectal polyp.  She would prefer Dr. Barry Dienes or Dr. Hassell Done as a Psychologist, sport and exercise.  I have asked Heather to get in touch with their office later on today or tomorrow morning to make appointment. -Would also recommend colonoscopy in 1 year or earlier depending upon surgeon's recommendations.

## 2018-02-18 ENCOUNTER — Other Ambulatory Visit: Payer: Self-pay

## 2018-02-18 DIAGNOSIS — K621 Rectal polyp: Secondary | ICD-10-CM

## 2018-02-19 ENCOUNTER — Ambulatory Visit: Payer: 59 | Admitting: Gastroenterology

## 2018-02-19 NOTE — Telephone Encounter (Signed)
Pt states she has not heard anything about surgery referral. Pt also states she had spoken with Dr.Gupta and was told he did not need an OV on 02/19/18. Best call back # [phone].

## 2018-02-19 NOTE — Telephone Encounter (Signed)
Notified patient of appointment with Dr. Hassell Done at [facility] for 04/05/2018 at 2pm.

## 2018-02-20 MED FILL — SUMAtriptan SUCCINATE 50 MG: 50 | 30 days supply | Qty: 9 | Fill #0

## 2018-02-20 MED FILL — ONDANSETRON HCL 4 MG TABLET: 4 | 6 days supply | Qty: 20 | Fill #1

## 2018-02-20 MED FILL — RIZATRIPTAN BENZOATE 10 MG: 10 | 30 days supply | Qty: 9 | Fill #0

## 2018-02-20 MED FILL — AMOXICILLIN 500 MG CAPSULE: 500 | 4 days supply | Qty: 16 | Fill #1

## 2018-02-25 MED FILL — ESTRADIOL 2 MG TABLET: 2 | 30 days supply | Qty: 30 | Fill #3

## 2018-02-25 MED FILL — TOPIRAMATE 100 MG TABLET: 100 | 30 days supply | Qty: 60 | Fill #3

## 2018-03-11 MED FILL — ACYCLOVIR 5 % CREA: 5 | 30 days supply | Qty: 5 | Fill #1

## 2018-03-25 MED FILL — RIZATRIPTAN BENZOATE 10 MG: 10 | 30 days supply | Qty: 9 | Fill #1

## 2018-03-25 MED FILL — SUMATRIPTAN SUCC 50 MG TAB: 50 | 30 days supply | Qty: 9 | Fill #1

## 2018-03-25 MED FILL — ESTRADIOL 2 MG TABLET: 2 | 30 days supply | Qty: 30 | Fill #4

## 2018-03-25 MED FILL — TOPIRAMATE 100 MG TABLET: 100 | 30 days supply | Qty: 60 | Fill #4

## 2018-04-05 ENCOUNTER — Ambulatory Visit: Payer: Self-pay | Admitting: Surgery

## 2018-04-05 DIAGNOSIS — D128 Benign neoplasm of rectum: Secondary | ICD-10-CM | POA: Diagnosis not present

## 2018-04-05 MED FILL — metroNIDAZOLE 500 MG TABS: 500 | 1 days supply | Qty: 6 | Fill #0

## 2018-04-05 MED FILL — NEOMYCIN 500 MG TABLET: 500 | 1 days supply | Qty: 6 | Fill #0

## 2018-04-05 NOTE — H&P (Signed)
Alexandra Cabrera Documented: 04/05/2018 2:08 PM Location: [facility] Patient #: [id number] DOB: Apr 09, 1966 Married / Language: Alexandra Cabrera / Race: White Female  History of Present Illness Alexandra Hector MD; 04/05/2018 4:00 PM) Patient words: CC: Referral by Dr. Lyndel Cabrera with [facility] for endoscopically unresectable polyp in distal rectum  HPI: [name] is a very pleasant 52yoF with hx of seasonal allergies, migraine headaches whom underwent her first colonoscopy with Dr. Lyndel Cabrera 02/04/18. Findings notable for TA in scending colon (removed) as well as an approximately 3cm polyp in the distal rectum which extended down to the dentate approximately 1cm from it based on endoscopic images; soft, biopsies showed tubular adenoma without dysplasia. She was referred for further evaluation. She denies any symptoms related to this including blood in stool. She normally moves her bowels every day with a help of a stool softener. She has a history of hemorrhoids and had hemorrhoid surgery when she was younger. It was quite painful so she's tried to be compliant on a bowel regimen. No incontinence or leaking. She is rather physically active. She works as an Health visitor at [employer]  No personal nor family history of GI/colon cancer, inflammatory bowel disease, irritable bowel syndrome, allergy such as Celiac Sprue, dietary/dairy problems, colitis, ulcers nor gastritis. No recent sick contacts/gastroenteritis. No travel outside the country. No changes in diet. No dysphagia to solids or liquids. No significant heartburn or reflux. No hematochezia, hematemesis, coffee ground emesis. No evidence of prior gastric/peptic ulceration.  PMH: Seasonal allergies Migraine headaches  PSH: Remote hemorrhoidectomy; denies any other anorectal procedures  FHx: Denies FHx of malignancy  Social: Denies use of tobacco/EtOH/drugs. She works at [employer] as an Health visitor  ROS: A comprehensive 10 system review of  systems was completed with the patient and pertinent findings as noted above.  The patient is a 52 year old female.   Allergies Alexandra Cabrera; 04/05/2018 2:08 PM) Latex Exam Gloves *MEDICAL DEVICES AND SUPPLIES* Phenergan *ANTIHISTAMINES* Dilaudid *ANALGESICS - OPIOID* Reglan *GASTROINTESTINAL AGENTS - MISC.* Allergies Reconciled  Medication History Alexandra Cabrera; 04/05/2018 2:08 PM) Estradiol (2MG  Tablet, Oral) Active. Topiramate (25MG  Tablet, Oral) Active. Topiramate (50MG  Tablet, Oral) Active. Imitrex (50MG  Tablet, Oral) Active. Topamax (50MG  Tablet, Oral) Active. Estrace (2MG  Tablet, Oral) Active. Multivitamins (Oral) Active. Maxalt-MLT (10MG  Tablet Disint, Oral) Active. Medications Reconciled    Vitals Alexandra Cabrera; 04/05/2018 2:09 PM) 04/05/2018 2:08 PM Weight: 124.4 lb Height: 60in Body Surface Area: 1.53 m Body Mass Index: 24.29 kg/m  Temp.: 97.24F(Temporal)  Pulse: 77 (Regular)  P.OX: 100% (Room air) BP: 100/62 (Sitting, Left Arm, Standard)      Physical Exam Alexandra Hector MD; 04/05/2018 3:41 PM)  General Mental Status-Alert. General Appearance-Not in acute distress, Not Sickly. Orientation-Oriented X3. Hydration-Well hydrated. Voice-Normal.  Integumentary Global Assessment Upon inspection and palpation of skin surfaces of the - Axillae: non-tender, no inflammation or ulceration, no drainage. and Distribution of scalp and body hair is normal. General Characteristics Temperature - normal warmth is noted.  Head and Neck Head-normocephalic, atraumatic with no lesions or palpable masses. Face Global Assessment - atraumatic, no absence of expression. Neck Global Assessment - no abnormal movements, no bruit auscultated on the right, no bruit auscultated on the left, no decreased range of motion, non-tender. Trachea-midline. Thyroid Gland Characteristics - non-tender.  Eye Eyeball -  Left-Extraocular movements intact, No Nystagmus. Eyeball - Right-Extraocular movements intact, No Nystagmus. Cornea - Left-No Hazy. Cornea - Right-No Hazy. Sclera/Conjunctiva - Left-No scleral icterus, No Discharge. Sclera/Conjunctiva -  Right-No scleral icterus, No Discharge. Pupil - Left-Direct reaction to light normal. Pupil - Right-Direct reaction to light normal.  ENMT Ears Pinna - Left - no drainage observed, no generalized tenderness observed. Right - no drainage observed, no generalized tenderness observed. Nose and Sinuses External Inspection of the Nose - no destructive lesion observed. Inspection of the nares - Left - quiet respiration. Right - quiet respiration. Mouth and Throat Lips - Upper Lip - no fissures observed, no pallor noted. Lower Lip - no fissures observed, no pallor noted. Nasopharynx - no discharge present. Oral Cavity/Oropharynx - Tongue - no dryness observed. Oral Mucosa - no cyanosis observed. Hypopharynx - no evidence of airway distress observed.  Chest and Lung Exam Inspection Movements - Normal and Symmetrical. Accessory muscles - No use of accessory muscles in breathing. Palpation Palpation of the chest reveals - Non-tender. Auscultation Breath sounds - Normal and Clear.  Breast Breast - Left-Symmetric, Non Tender, No Biopsy scars, no Dimpling, No Inflammation, No Lumpectomy scars, No Mastectomy scars, No Peau d' Orange. Breast - Right-Symmetric, Non Tender, No Biopsy scars, no Dimpling, No Inflammation, No Lumpectomy scars, No Mastectomy scars, No Peau d' Orange. Breast Lump-No Palpable Breast Mass.  Cardiovascular Auscultation Rhythm - Regular. Murmurs & Other Heart Sounds - Auscultation of the heart reveals - No Murmurs and No Systolic Clicks.  Abdomen Inspection Inspection of the abdomen reveals - No Visible peristalsis and No Abnormal pulsations. Umbilicus - No Bleeding, No Urine drainage. Palpation/Percussion Palpation  and Percussion of the abdomen reveal - Soft, Non Tender, No Rebound tenderness, No Rigidity (guarding) and No Cutaneous hyperesthesia. Note: Abdomen soft. Not severely distended. No distasis recti. No umbilical or other anterior abdominal wall hernias  Female Genitourinary Sexual Maturity Tanner 5 - Adult hair pattern. Note: No inguinal hernias. No lymphadenopathy. No vaginal bleeding nor discharge  Rectal Note: Partially prolapsed hemorrhoidal tissue right posterior.  Tolerates digital rectal exam. Sensitive been intact sphincter without stricturing. Soft but multiple polypoid mass on right lateral rectal wall involving about at least a quarter of the circumference. Feels mobile. 1-4 cm from the anal verge. No anterior or left lateral rectal wall masses.  No fissure. No fistula. No abscess. No stricture. No pruritus. No pilonidal disease.  Peripheral Vascular Upper Extremity Inspection - Left - No Cyanotic nailbeds, Not Ischemic. Right - No Cyanotic nailbeds, Not Ischemic.  Neurologic Neurologic evaluation reveals -normal attention span and ability to concentrate, able to name objects and repeat phrases. Appropriate fund of knowledge , normal sensation and normal coordination. Mental Status Affect - not angry, not paranoid. Cranial Nerves-Normal Bilaterally. Gait-Normal.  Neuropsychiatric Mental status exam performed with findings of-able to articulate well with normal speech/language, rate, volume and coherence, thought content normal with ability to perform basic computations and apply abstract reasoning and no evidence of hallucinations, delusions, obsessions or homicidal/suicidal ideation.  Musculoskeletal Global Assessment Spine, Ribs and Pelvis - no instability, subluxation or laxity. Right Upper Extremity - no instability, subluxation or laxity.  Lymphatic Head & Neck  General Head & Neck Lymphatics: Bilateral - Description - No Localized  lymphadenopathy. Axillary  General Axillary Region: Bilateral - Description - No Localized lymphadenopathy. Femoral & Inguinal  Generalized Femoral & Inguinal Lymphatics: Left - Description - No Localized lymphadenopathy. Right - Description - No Localized lymphadenopathy.    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  I think she would  benefit from resection. Reasonable candidate to do TEM approach. Right-side-down decubitus positioning. May have to do a combinational excision distally with [place] retractor. We will see.  Current Plans Pt Education - [facility] TEM Education (Laycee Fitzsimmons): discussed with patient and provided information. Pt Education - Polyps in the Colon and Rectum (Colonic and Rectal Polyps): colonic polyps  PREOP COLON - ENCOUNTER FOR PREOPERATIVE EXAMINATION FOR GENERAL SURGICAL PROCEDURE (Z01.818)  Current Plans You are being scheduled for surgery- Our schedulers will call you.  You should hear from our office's scheduling department within 5 working days about the location, date, and time of surgery. We try to make accommodations for patient's preferences in scheduling surgery, but sometimes the OR schedule or the surgeon's schedule prevents [place] from making those accommodations.  If you have not heard from our office [phone]) in 5 working days, call the office and ask for your surgeon's nurse.  If you have other questions about your diagnosis, plan, or surgery, call the office and ask for your surgeon's nurse.  Written instructions provided The anatomy & physiology of the digestive tract was discussed. The pathophysiology of the colon was discussed. Natural history risks without surgery was discussed. I feel the risks of no intervention will lead to serious problems that outweigh the operative risks;  therefore, I recommended a partial colectomy to remove the pathology. Minimally invasive (Robotic/Laparoscopic) & open techniques were discussed.  Risks such as bleeding, infection, abscess, leak, reoperation, possible ostomy, hernia, heart attack, death, and other risks were discussed. I noted a good likelihood this will help address the problem. Goals of post-operative recovery were discussed as well. Need for adequate nutrition, daily bowel regimen and healthy physical activity, to optimize recovery was noted as well. We will work to minimize complications. Educational materials were available as well. Questions were answered. The patient expresses understanding & wishes to proceed with surgery.  Pt Education - [facility] Colon Bowel Prep 2018 ERAS/Miralax/Antibiotics Started Neomycin Sulfate 500 MG Oral Tablet, 2 (two) Tablet SEE NOTE, #6, 04/05/2018, No Refill. Local Order: TAKE TWO TABLETS AT 2 PM, 3 PM, AND 10 PM THE DAY PRIOR TO SURGERY Started Flagyl 500 MG Oral Tablet, 2 (two) Tablet SEE NOTE, #6, 04/05/2018, No Refill. Local Order: Take at 2pm, 3pm, and 10pm the day prior to your colon operation Pt Education - Pamphlet Given - Laparoscopic Colorectal Surgery: discussed with patient and provided information. Pt Education - [facility] Colectomy post-op instructions: discussed with patient and provided information.  Alexandra Hector, MD, FACS, MASCRS Gastrointestinal and Minimally Invasive Surgery    [address] [place], [place] [address] [phone] Main / Paging [phone] Fax

## 2018-05-02 MED FILL — RIZATRIPTAN BENZOATE 10 MG: 10 | 30 days supply | Qty: 9 | Fill #2

## 2018-05-02 MED FILL — IBUPROFEN 800 MG TAB: 800 | 7 days supply | Qty: 30 | Fill #1

## 2018-05-02 MED FILL — SUMATRIPTAN SUCC 50 MG TAB: 50 | 30 days supply | Qty: 9 | Fill #2

## 2018-05-02 MED FILL — TOPIRAMATE 100 MG TABLET: 100 | 30 days supply | Qty: 60 | Fill #5

## 2018-05-02 MED FILL — ONDANSETRON HCL 4 MG TABLET: 4 | 6 days supply | Qty: 20 | Fill #2

## 2018-05-02 MED FILL — ESTRADIOL 2 MG TABLET: 2 | 30 days supply | Qty: 30 | Fill #5

## 2018-05-08 ENCOUNTER — Telehealth: Payer: Self-pay | Admitting: *Deleted

## 2018-05-08 NOTE — Telephone Encounter (Signed)
Received Cologuard Order Cancellation: [id number]; order has been Cancelled because it has been Inactive, and has exceeded the 365 days from the Initial order; forwarded to provider/SLS 10/09

## 2018-05-16 NOTE — Progress Notes (Signed)
08/14/2017- noted in [place] and CXR

## 2018-05-16 NOTE — Patient Instructions (Signed)
Alexandra Cabrera  05/16/2018   Your procedure is scheduled on: Wednesday 05/29/2018  Report to [facility] Main  Entrance             Report to admitting at  1000  AM    Call this number if you have problems the morning of surgery [phone]    Remember: Do not eat food  :After Midnight on Monday 05/27/2018.              Follow Bowel prep instructions from Dr. Clyda Greener office on Tuesday 05/28/2018 and a clear liquid diet all day.  DRINK 2 PRESURGERY ENSURE DRINKS THE NIGHT BEFORE SURGERY AT  1000 PM AND 1 PRESURGERY DRINK THE DAY OF THE PROCEDURE 3 HOURS PRIOR TO SCHEDULED SURGERY. NO SOLIDS AFTER MIDNIGHT THE DAY PRIOR TO THE SURGERY. NOTHING BY MOUTH EXCEPT CLEAR LIQUIDS UNTIL THREE HOURS PRIOR TO SCHEDULED SURGERY. PLEASE FINISH PRESURGERY ENSURE DRINK PER SURGEON ORDER 3 HOURS PRIOR TO SCHEDULED SURGERY TIME WHICH NEEDS TO BE COMPLETED AT 0900 am.    CLEAR LIQUID DIET   Foods Allowed                                                                     Foods Excluded  Coffee and tea, regular and decaf                             liquids that you cannot  Plain Jell-O in any flavor                                             see through such as: Fruit ices (not with fruit pulp)                                     milk, soups, orange juice  Iced Popsicles                                    All solid food Carbonated beverages, regular and diet                                    Cranberry, grape and apple juices Sports drinks like Gatorade Lightly seasoned clear broth or consume(fat free) Sugar, honey syrup  Sample Menu Breakfast                                Lunch                                     Supper Cranberry juice  Beef broth                            Chicken broth Jell-O                                     Grape juice                           Apple juice Coffee or tea                        Jell-O                                       Popsicle                                                Coffee or tea                        Coffee or tea  _____________________________________________________________________                 BRUSH YOUR TEETH MORNING OF SURGERY AND RINSE YOUR MOUTH OUT, NO CHEWING GUM CANDY OR MINTS.     Take these medicines the morning of surgery with A SIP OF WATER: Topiramate (Topamax)                                You may not have any metal on your body including hair pins and              piercings  Do not wear jewelry, make-up, lotions, powders or perfumes, deodorant             Do not wear nail polish.  Do not shave  48 hours prior to surgery.                 Do not bring valuables to the hospital. [place].  Contacts, dentures or bridgework may not be worn into surgery.  Leave suitcase in the car. After surgery it may be brought to your room.                  Please read over the following fact sheets you were given: _____________________________________________________________________             [facility] - Preparing for Surgery Before surgery, you can play an important role.  Because skin is not sterile, your skin needs to be as free of germs as possible.  You can reduce the number of germs on your skin by washing with CHG (chlorahexidine gluconate) soap before surgery.  CHG is an antiseptic cleaner which kills germs and bonds with the skin to continue killing germs even after washing. Please DO NOT use if you have an allergy to CHG or antibacterial soaps.  If your skin becomes reddened/irritated stop using the CHG and inform your nurse when  you arrive at Short Stay. Do not shave (including legs and underarms) for at least 48 hours prior to the first CHG shower.  You may shave your face/neck. Please follow these instructions carefully:  1.  Shower with CHG Soap the night before surgery and the  morning of Surgery.  2.  If you  choose to wash your hair, wash your hair first as usual with your  normal  shampoo.  3.  After you shampoo, rinse your hair and body thoroughly to remove the  shampoo.                           4.  Use CHG as you would any other liquid soap.  You can apply chg directly  to the skin and wash                       Gently with a scrungie or clean washcloth.  5.  Apply the CHG Soap to your body ONLY FROM THE NECK DOWN.   Do not use on face/ open                           Wound or open sores. Avoid contact with eyes, ears mouth and genitals (private parts).                       Wash face,  Genitals (private parts) with your normal soap.             6.  Wash thoroughly, paying special attention to the area where your surgery  will be performed.  7.  Thoroughly rinse your body with warm water from the neck down.  8.  DO NOT shower/wash with your normal soap after using and rinsing off  the CHG Soap.                9.  Pat yourself dry with a clean towel.            10.  Wear clean pajamas.            11.  Place clean sheets on your bed the night of your first shower and do not  sleep with pets. Day of Surgery : Do not apply any lotions/deodorants the morning of surgery.  Please wear clean clothes to the hospital/surgery center.  FAILURE TO FOLLOW THESE INSTRUCTIONS MAY RESULT IN THE CANCELLATION OF YOUR SURGERY PATIENT SIGNATURE_________________________________  NURSE SIGNATURE__________________________________  ________________________________________________________________________   Adam Phenix  An incentive spirometer is a tool that can help keep your lungs clear and active. This tool measures how well you are filling your lungs with each breath. Taking long deep breaths may help reverse or decrease the chance of developing breathing (pulmonary) problems (especially infection) following:  A long period of time when you are unable to move or be active. BEFORE THE PROCEDURE   If  the spirometer includes an indicator to show your best effort, your nurse or respiratory therapist will set it to a desired goal.  If possible, sit up straight or lean slightly forward. Try not to slouch.  Hold the incentive spirometer in an upright position. INSTRUCTIONS FOR USE  1. Sit on the edge of your bed if possible, or sit up as far as you can in bed or on a chair. 2. Hold the incentive spirometer in an upright position. 3. Breathe out  normally. 4. Place the mouthpiece in your mouth and seal your lips tightly around it. 5. Breathe in slowly and as deeply as possible, raising the piston or the ball toward the top of the column. 6. Hold your breath for 3-5 seconds or for as long as possible. Allow the piston or ball to fall to the bottom of the column. 7. Remove the mouthpiece from your mouth and breathe out normally. 8. Rest for a few seconds and repeat Steps 1 through 7 at least 10 times every 1-2 hours when you are awake. Take your time and take a few normal breaths between deep breaths. 9. The spirometer may include an indicator to show your best effort. Use the indicator as a goal to work toward during each repetition. 10. After each set of 10 deep breaths, practice coughing to be sure your lungs are clear. If you have an incision (the cut made at the time of surgery), support your incision when coughing by placing a pillow or rolled up towels firmly against it. Once you are able to get out of bed, walk around indoors and cough well. You may stop using the incentive spirometer when instructed by your caregiver.  RISKS AND COMPLICATIONS  Take your time so you do not get dizzy or light-headed.  If you are in pain, you may need to take or ask for pain medication before doing incentive spirometry. It is harder to take a deep breath if you are having pain. AFTER USE  Rest and breathe slowly and easily.  It can be helpful to keep track of a log of your progress. Your caregiver can  provide you with a simple table to help with this. If you are using the spirometer at home, follow these instructions: [place] IF:   You are having difficultly using the spirometer.  You have trouble using the spirometer as often as instructed.  Your pain medication is not giving enough relief while using the spirometer.  You develop fever of 100.5 F (38.1 C) or higher. SEEK IMMEDIATE MEDICAL CARE IF:   You cough up bloody sputum that had not been present before.  You develop fever of 102 F (38.9 C) or greater.  You develop worsening pain at or near the incision site. MAKE SURE YOU:   Understand these instructions.  Will watch your condition.  Will get help right away if you are not doing well or get worse. Document Released: 11/27/2006 Document Revised: 10/09/2011 Document Reviewed: 01/28/2007 [facility] Patient Information 2014 [place], [place].   ________________________________________________________________________

## 2018-05-23 ENCOUNTER — Encounter (HOSPITAL_COMMUNITY)
Admission: RE | Admit: 2018-05-23 | Discharge: 2018-05-23 | Disposition: A | Discharge status: Home / Self Care | Payer: 59 | Source: Ambulatory Visit | Attending: Surgery | Admitting: Surgery

## 2018-05-23 ENCOUNTER — Other Ambulatory Visit: Payer: Self-pay

## 2018-05-23 ENCOUNTER — Encounter (HOSPITAL_COMMUNITY): Payer: Self-pay

## 2018-05-23 DIAGNOSIS — Z01812 Encounter for preprocedural laboratory examination: Secondary | ICD-10-CM | POA: Insufficient documentation

## 2018-05-23 LAB — CBC
HEMATOCRIT: 40 % (ref 36.0–46.0)
Hemoglobin: 13.4 g/dL (ref 12.0–15.0)
MCH: 32.3 pg (ref 26.0–34.0)
MCHC: 33.5 g/dL (ref 30.0–36.0)
MCV: 96.4 fL (ref 80.0–100.0)
PLATELETS: 335 10*3/uL (ref 150–400)
RBC: 4.15 MIL/uL (ref 3.87–5.11)
RDW: 12.7 % (ref 11.5–15.5)
WBC: 6.3 10*3/uL (ref 4.0–10.5)
nRBC: 0 % (ref 0.0–0.2)

## 2018-05-23 LAB — BASIC METABOLIC PANEL
Anion gap: 7 (ref 5–15)
BUN: 15 mg/dL (ref 6–20)
CHLORIDE: 111 mmol/L (ref 98–111)
CO2: 24 mmol/L (ref 22–32)
CREATININE: 0.76 mg/dL (ref 0.44–1.00)
Calcium: 9.1 mg/dL (ref 8.9–10.3)
GFR calc Af Amer: >60 mL/min (ref >60)
GFR calc non Af Amer: >60 mL/min (ref >60)
GLUCOSE: 94 mg/dL (ref 70–99)
Potassium: 4.1 mmol/L (ref 3.5–5.1)
Sodium: 142 mmol/L (ref 135–145)

## 2018-05-23 LAB — HEMOGLOBIN A1C
Hgb A1c MFr Bld: 5.3 % (ref 4.8–5.6)
Mean Plasma Glucose: 105.41 mg/dL

## 2018-05-23 NOTE — Consult Note (Signed)
[place] Nurse requested for preoperative stoma site marking  Discussed surgical procedure and stoma creation with patient and family. Explained role of the [place] nurse team.  Provided the patient with educational booklet and provided samples of pouching options.  Answered patient and family questions.   Examined patient sitting, and standing in order to place the marking in the patient's visual field, away from any creases or abdominal contour issues and within the rectus muscle.  Attempted to mark below the patient's belt line.   Marked for colostomy in the LLQ  ___6_ cm to the left of the umbilicus and __6__AU below the umbilicus.  Marked for ileostomy in the RLQ  __6__cm to the right of the umbilicus and  __6__ cm below the umbilicus.  Patient's abdomen cleansed with CHG wipes at site markings, allowed to air dry prior to marking.Covered mark with thin film transparent dressing to preserve mark until date of surgery. Marking pen given to patient and told to color in if it begins to fade before surgery. [place] Nurse team will follow up with patient after surgery for continue ostomy care and teaching if an ostomy is created. Julien Girt MSN, RN, [place], [place], [place]

## 2018-05-24 ENCOUNTER — Encounter: Payer: Self-pay | Admitting: Family Medicine

## 2018-05-28 ENCOUNTER — Encounter: Payer: Self-pay | Admitting: Family Medicine

## 2018-05-28 MED ORDER — TOPIRAMATE 100 MG PO TABS: 100.0000 mg | ORAL_TABLET | Freq: Two times a day (BID) | ORAL | 5 refills | Status: DC

## 2018-05-28 MED ORDER — ESTRADIOL 2 MG PO TABS: 2.0000 mg | ORAL_TABLET | Freq: Every day | ORAL | 5 refills | Status: DC

## 2018-05-28 MED ORDER — BUPIVACAINE LIPOSOME 1.3 % IJ SUSP
20.0000 mL | INTRAMUSCULAR | Status: DC
  Filled 2018-05-28: qty 20

## 2018-05-28 MED FILL — ESTRADIOL 2 MG TABLET: 2 | 30 days supply | Qty: 30 | Fill #0

## 2018-05-28 MED FILL — TOPIRAMATE 100 MG TABLET: 100 | 30 days supply | Qty: 60 | Fill #0

## 2018-05-29 ENCOUNTER — Ambulatory Visit (HOSPITAL_COMMUNITY): Payer: 59

## 2018-05-29 ENCOUNTER — Other Ambulatory Visit: Payer: Self-pay

## 2018-05-29 ENCOUNTER — Encounter (HOSPITAL_COMMUNITY)
Admission: RE | Disposition: A | Discharge status: Home / Self Care | Payer: Self-pay | Source: Ambulatory Visit | Attending: Surgery

## 2018-05-29 ENCOUNTER — Encounter (HOSPITAL_COMMUNITY): Payer: Self-pay | Admitting: *Deleted

## 2018-05-29 ENCOUNTER — Ambulatory Visit (HOSPITAL_COMMUNITY)
Admission: RE | Admit: 2018-05-29 | Discharge: 2018-05-29 | Disposition: A | Discharge status: Home / Self Care | Payer: 59 | Source: Ambulatory Visit | Attending: Surgery | Admitting: Surgery

## 2018-05-29 DIAGNOSIS — G43909 Migraine, unspecified, not intractable, without status migrainosus: Secondary | ICD-10-CM | POA: Insufficient documentation

## 2018-05-29 DIAGNOSIS — E782 Mixed hyperlipidemia: Secondary | ICD-10-CM | POA: Diagnosis not present

## 2018-05-29 DIAGNOSIS — K644 Residual hemorrhoidal skin tags: Secondary | ICD-10-CM | POA: Insufficient documentation

## 2018-05-29 DIAGNOSIS — Z833 Family history of diabetes mellitus: Secondary | ICD-10-CM | POA: Diagnosis not present

## 2018-05-29 DIAGNOSIS — N3010 Interstitial cystitis (chronic) without hematuria: Secondary | ICD-10-CM | POA: Diagnosis not present

## 2018-05-29 DIAGNOSIS — Z8249 Family history of ischemic heart disease and other diseases of the circulatory system: Secondary | ICD-10-CM | POA: Insufficient documentation

## 2018-05-29 DIAGNOSIS — L918 Other hypertrophic disorders of the skin: Secondary | ICD-10-CM | POA: Insufficient documentation

## 2018-05-29 DIAGNOSIS — K589 Irritable bowel syndrome without diarrhea: Secondary | ICD-10-CM | POA: Diagnosis not present

## 2018-05-29 DIAGNOSIS — K621 Rectal polyp: Secondary | ICD-10-CM | POA: Diagnosis not present

## 2018-05-29 DIAGNOSIS — J45909 Unspecified asthma, uncomplicated: Secondary | ICD-10-CM | POA: Insufficient documentation

## 2018-05-29 DIAGNOSIS — Z9049 Acquired absence of other specified parts of digestive tract: Secondary | ICD-10-CM | POA: Diagnosis not present

## 2018-05-29 DIAGNOSIS — K219 Gastro-esophageal reflux disease without esophagitis: Secondary | ICD-10-CM | POA: Insufficient documentation

## 2018-05-29 DIAGNOSIS — M1990 Unspecified osteoarthritis, unspecified site: Secondary | ICD-10-CM | POA: Diagnosis not present

## 2018-05-29 DIAGNOSIS — Z90710 Acquired absence of both cervix and uterus: Secondary | ICD-10-CM | POA: Insufficient documentation

## 2018-05-29 DIAGNOSIS — Z79899 Other long term (current) drug therapy: Secondary | ICD-10-CM | POA: Diagnosis not present

## 2018-05-29 DIAGNOSIS — Z888 Allergy status to other drugs, medicaments and biological substances status: Secondary | ICD-10-CM | POA: Insufficient documentation

## 2018-05-29 DIAGNOSIS — Z91040 Latex allergy status: Secondary | ICD-10-CM | POA: Insufficient documentation

## 2018-05-29 DIAGNOSIS — N809 Endometriosis, unspecified: Secondary | ICD-10-CM | POA: Insufficient documentation

## 2018-05-29 DIAGNOSIS — D128 Benign neoplasm of rectum: Secondary | ICD-10-CM | POA: Diagnosis not present

## 2018-05-29 DIAGNOSIS — Z96651 Presence of right artificial knee joint: Secondary | ICD-10-CM | POA: Insufficient documentation

## 2018-05-29 DIAGNOSIS — I129 Hypertensive chronic kidney disease with stage 1 through stage 4 chronic kidney disease, or unspecified chronic kidney disease: Secondary | ICD-10-CM | POA: Diagnosis not present

## 2018-05-29 DIAGNOSIS — Z825 Family history of asthma and other chronic lower respiratory diseases: Secondary | ICD-10-CM | POA: Insufficient documentation

## 2018-05-29 DIAGNOSIS — Z8261 Family history of arthritis: Secondary | ICD-10-CM | POA: Diagnosis not present

## 2018-05-29 DIAGNOSIS — N189 Chronic kidney disease, unspecified: Secondary | ICD-10-CM | POA: Diagnosis not present

## 2018-05-29 DIAGNOSIS — Z803 Family history of malignant neoplasm of breast: Secondary | ICD-10-CM | POA: Diagnosis not present

## 2018-05-29 DIAGNOSIS — Z8041 Family history of malignant neoplasm of ovary: Secondary | ICD-10-CM | POA: Insufficient documentation

## 2018-05-29 DIAGNOSIS — Z87442 Personal history of urinary calculi: Secondary | ICD-10-CM | POA: Insufficient documentation

## 2018-05-29 HISTORY — PX: PARTIAL PROCTECTOMY BY TEM: SHX6011

## 2018-05-29 SURGERY — PARTIAL PROCTECTOMY BY TEM
Anesthesia: General | Site: Rectum

## 2018-05-29 MED ORDER — HYDROMORPHONE HCL 1 MG/ML IJ SOLN: 0.2500 mg | INTRAMUSCULAR | Status: DC | PRN

## 2018-05-29 MED ORDER — MIDAZOLAM HCL 2 MG/2ML IJ SOLN
INTRAMUSCULAR | Status: DC | PRN
  Administered 2018-05-29: 1 mg via INTRAVENOUS

## 2018-05-29 MED ORDER — LIDOCAINE 2% (20 MG/ML) 5 ML SYRINGE
INTRAMUSCULAR | Status: DC | PRN
  Administered 2018-05-29: 1.5 mg/kg/h via INTRAVENOUS

## 2018-05-29 MED ORDER — ONDANSETRON HCL 4 MG/2ML IJ SOLN
INTRAMUSCULAR | Status: AC
  Filled 2018-05-29: qty 2

## 2018-05-29 MED ORDER — FENTANYL CITRATE (PF) 100 MCG/2ML IJ SOLN
INTRAMUSCULAR | Status: AC
  Filled 2018-05-29: qty 2

## 2018-05-29 MED ORDER — ENOXAPARIN SODIUM 40 MG/0.4ML ~~LOC~~ SOLN
40.0000 mg | Freq: Once | SUBCUTANEOUS | Status: AC
  Administered 2018-05-29: 40 mg via SUBCUTANEOUS
  Filled 2018-05-29: qty 0.4

## 2018-05-29 MED ORDER — EPHEDRINE 5 MG/ML INJ
INTRAVENOUS | Status: AC
  Filled 2018-05-29: qty 10

## 2018-05-29 MED ORDER — BUPIVACAINE HCL (PF) 0.25 % IJ SOLN
INTRAMUSCULAR | Status: AC
  Filled 2018-05-29: qty 30

## 2018-05-29 MED ORDER — GABAPENTIN 300 MG PO CAPS: 300.0000 mg | ORAL_CAPSULE | Freq: Two times a day (BID) | ORAL | 2 refills | Status: DC

## 2018-05-29 MED ORDER — 0.9 % SODIUM CHLORIDE (POUR BTL) OPTIME
TOPICAL | Status: DC | PRN
  Administered 2018-05-29: 1000 mL

## 2018-05-29 MED ORDER — SUGAMMADEX SODIUM 200 MG/2ML IV SOLN
INTRAVENOUS | Status: AC
  Filled 2018-05-29: qty 2

## 2018-05-29 MED ORDER — GABAPENTIN 300 MG PO CAPS
300.0000 mg | ORAL_CAPSULE | ORAL | Status: AC
  Administered 2018-05-29: 300 mg via ORAL
  Filled 2018-05-29: qty 1

## 2018-05-29 MED ORDER — FENTANYL CITRATE (PF) 250 MCG/5ML IJ SOLN
INTRAMUSCULAR | Status: AC
  Filled 2018-05-29: qty 5

## 2018-05-29 MED ORDER — OXYCODONE HCL 5 MG PO TABS: 5.0000 mg | ORAL_TABLET | Freq: Once | ORAL | Status: DC | PRN

## 2018-05-29 MED ORDER — FENTANYL CITRATE (PF) 250 MCG/5ML IJ SOLN
INTRAMUSCULAR | Status: DC | PRN
  Administered 2018-05-29: 50 ug via INTRAVENOUS

## 2018-05-29 MED ORDER — LACTATED RINGERS IV SOLN: INTRAVENOUS | Status: DC

## 2018-05-29 MED ORDER — PROMETHAZINE HCL 25 MG/ML IJ SOLN: 6.2500 mg | INTRAMUSCULAR | Status: DC | PRN

## 2018-05-29 MED ORDER — DIBUCAINE 1 % RE OINT
TOPICAL_OINTMENT | RECTAL | Status: AC
  Filled 2018-05-29: qty 28

## 2018-05-29 MED ORDER — LIDOCAINE 2% (20 MG/ML) 5 ML SYRINGE
INTRAMUSCULAR | Status: AC
  Filled 2018-05-29: qty 15

## 2018-05-29 MED ORDER — EPHEDRINE SULFATE-NACL 50-0.9 MG/10ML-% IV SOSY
PREFILLED_SYRINGE | INTRAVENOUS | Status: DC | PRN
  Administered 2018-05-29 (×2): 5 mg via INTRAVENOUS

## 2018-05-29 MED ORDER — SODIUM CHLORIDE 0.9 % IV SOLN
2.0000 g | INTRAVENOUS | Status: AC
  Administered 2018-05-29: 2 g via INTRAVENOUS
  Filled 2018-05-29: qty 2

## 2018-05-29 MED ORDER — CHLORHEXIDINE GLUCONATE CLOTH 2 % EX PADS: 6.0000 | MEDICATED_PAD | Freq: Once | CUTANEOUS | Status: DC

## 2018-05-29 MED ORDER — OXYCODONE HCL 5 MG/5ML PO SOLN
5.0000 mg | Freq: Once | ORAL | Status: DC | PRN
  Filled 2018-05-29: qty 5

## 2018-05-29 MED ORDER — BUPIVACAINE HCL (PF) 0.25 % IJ SOLN
INTRAMUSCULAR | Status: DC | PRN
  Administered 2018-05-29: 30 mL

## 2018-05-29 MED ORDER — ALVIMOPAN 12 MG PO CAPS
12.0000 mg | ORAL_CAPSULE | ORAL | Status: AC
  Administered 2018-05-29: 12 mg via ORAL
  Filled 2018-05-29: qty 1

## 2018-05-29 MED ORDER — OXYCODONE HCL 5 MG PO TABS: 5.0000 mg | ORAL_TABLET | Freq: Four times a day (QID) | ORAL | 0 refills | Status: DC | PRN

## 2018-05-29 MED ORDER — PHENYLEPHRINE 40 MCG/ML (10ML) SYRINGE FOR IV PUSH (FOR BLOOD PRESSURE SUPPORT)
PREFILLED_SYRINGE | INTRAVENOUS | Status: DC | PRN
  Administered 2018-05-29: 80 ug via INTRAVENOUS

## 2018-05-29 MED ORDER — KETAMINE HCL 10 MG/ML IJ SOLN
INTRAMUSCULAR | Status: DC | PRN
  Administered 2018-05-29: 10 mg via INTRAVENOUS

## 2018-05-29 MED ORDER — BUPIVACAINE LIPOSOME 1.3 % IJ SUSP
INTRAMUSCULAR | Status: DC | PRN
  Administered 2018-05-29: 20 mL

## 2018-05-29 MED ORDER — DEXAMETHASONE SODIUM PHOSPHATE 10 MG/ML IJ SOLN
INTRAMUSCULAR | Status: DC | PRN
  Administered 2018-05-29: 5 mg via INTRAVENOUS

## 2018-05-29 MED ORDER — NEOMYCIN SULFATE 500 MG PO TABS: 1000.0000 mg | ORAL_TABLET | ORAL | Status: DC

## 2018-05-29 MED ORDER — ACETAMINOPHEN 500 MG PO TABS
1000.0000 mg | ORAL_TABLET | ORAL | Status: AC
  Administered 2018-05-29: 1000 mg via ORAL
  Filled 2018-05-29: qty 2

## 2018-05-29 MED ORDER — SUGAMMADEX SODIUM 200 MG/2ML IV SOLN
INTRAVENOUS | Status: DC | PRN
  Administered 2018-05-29: 110 mg via INTRAVENOUS

## 2018-05-29 MED ORDER — LIDOCAINE 2% (20 MG/ML) 5 ML SYRINGE
INTRAMUSCULAR | Status: DC | PRN
  Administered 2018-05-29: 40 mg via INTRAVENOUS

## 2018-05-29 MED ORDER — LACTATED RINGERS IR SOLN
Status: DC | PRN
  Administered 2018-05-29: 1000 mL

## 2018-05-29 MED ORDER — ROCURONIUM BROMIDE 10 MG/ML (PF) SYRINGE
PREFILLED_SYRINGE | INTRAVENOUS | Status: DC | PRN
  Administered 2018-05-29: 10 mg via INTRAVENOUS
  Administered 2018-05-29: 5 mg via INTRAVENOUS
  Administered 2018-05-29: 35 mg via INTRAVENOUS

## 2018-05-29 MED ORDER — CELECOXIB 200 MG PO CAPS
200.0000 mg | ORAL_CAPSULE | ORAL | Status: AC
  Administered 2018-05-29: 200 mg via ORAL
  Filled 2018-05-29: qty 1

## 2018-05-29 MED ORDER — FENTANYL CITRATE (PF) 100 MCG/2ML IJ SOLN
25.0000 ug | INTRAMUSCULAR | Status: DC | PRN
  Administered 2018-05-29: 25 ug via INTRAVENOUS

## 2018-05-29 MED ORDER — SODIUM CHLORIDE 0.9 % IV SOLN
INTRAVENOUS | Status: DC
  Filled 2018-05-29: qty 6

## 2018-05-29 MED ORDER — ONDANSETRON HCL 4 MG/2ML IJ SOLN
4.0000 mg | Freq: Once | INTRAMUSCULAR | Status: AC
  Administered 2018-05-29: 4 mg via INTRAVENOUS

## 2018-05-29 MED ORDER — PROPOFOL 10 MG/ML IV BOLUS
INTRAVENOUS | Status: DC | PRN
  Administered 2018-05-29 (×2): 100 mg via INTRAVENOUS

## 2018-05-29 MED ORDER — PROPOFOL 10 MG/ML IV BOLUS
INTRAVENOUS | Status: AC
  Filled 2018-05-29: qty 20

## 2018-05-29 MED ORDER — METRONIDAZOLE 500 MG PO TABS: 1000.0000 mg | ORAL_TABLET | ORAL | Status: DC

## 2018-05-29 MED ORDER — AMBULATORY NON FORMULARY MEDICATION: 1.0000 "application " | Freq: Four times a day (QID) | 2 refills | Status: DC

## 2018-05-29 MED ORDER — DEXAMETHASONE SODIUM PHOSPHATE 10 MG/ML IJ SOLN
INTRAMUSCULAR | Status: AC
  Filled 2018-05-29: qty 1

## 2018-05-29 MED ORDER — POLYETHYLENE GLYCOL 3350 17 GM/SCOOP PO POWD: 1.0000 | Freq: Once | ORAL | Status: DC

## 2018-05-29 MED ORDER — BISACODYL 5 MG PO TBEC
20.0000 mg | DELAYED_RELEASE_TABLET | Freq: Once | ORAL | Status: DC
  Filled 2018-05-29: qty 4

## 2018-05-29 MED ORDER — ROCURONIUM BROMIDE 10 MG/ML (PF) SYRINGE
PREFILLED_SYRINGE | INTRAVENOUS | Status: AC
  Filled 2018-05-29: qty 10

## 2018-05-29 MED ORDER — ONDANSETRON HCL 4 MG/2ML IJ SOLN
INTRAMUSCULAR | Status: DC | PRN
  Administered 2018-05-29: 4 mg via INTRAVENOUS

## 2018-05-29 MED ORDER — MIDAZOLAM HCL 2 MG/2ML IJ SOLN
INTRAMUSCULAR | Status: AC
  Filled 2018-05-29: qty 2

## 2018-05-29 MED ORDER — STERILE WATER FOR IRRIGATION IR SOLN
Status: DC | PRN
  Administered 2018-05-29: 1000 mL

## 2018-05-29 MED FILL — oxyCODONE HCL 5 MG TABS: 5 | 5 days supply | Qty: 40 | Fill #0

## 2018-05-29 SURGICAL SUPPLY — 51 items
BLADE SURG 15 STRL LF DISP TIS (BLADE) IMPLANT
BLADE SURG 15 STRL SS (BLADE)
BRIEF STRETCH FOR OB PAD LRG (UNDERPADS AND DIAPERS) IMPLANT
CABLE HIGH FREQUENCY MONO STRZ (ELECTRODE) ×2 IMPLANT
CATH FOLEY LATEX FREE 16FR (CATHETERS) ×2 IMPLANT
COVER WAND RF STERILE (DRAPES) ×2 IMPLANT
DRAPE LAPAROTOMY T 102X78X121 (DRAPES) ×2 IMPLANT
DRAPE SURG IRRIG POUCH 19X23 (DRAPES) ×2 IMPLANT
DRAPE WARM FLUID 44X44 (DRAPE) ×2 IMPLANT
DRSG PAD ABDOMINAL 8X10 ST (GAUZE/BANDAGES/DRESSINGS) IMPLANT
ELECT PENCIL ROCKER SW 15FT (MISCELLANEOUS) IMPLANT
ELECT REM PT RETURN 15FT ADLT (MISCELLANEOUS) ×2 IMPLANT
GAUZE 4X4 16PLY RFD (DISPOSABLE) ×2 IMPLANT
GAUZE SPONGE 4X4 12PLY STRL (GAUZE/BANDAGES/DRESSINGS) ×2 IMPLANT
GLOVE ECLIPSE 8.0 STRL XLNG CF (GLOVE) ×4 IMPLANT
GLOVE INDICATOR 8.0 STRL GRN (GLOVE) ×4 IMPLANT
GOWN STRL REUS W/TWL XL LVL3 (GOWN DISPOSABLE) ×6 IMPLANT
KIT BASIN OR (CUSTOM PROCEDURE TRAY) ×2 IMPLANT
LEGGING LITHOTOMY PAIR STRL (DRAPES) ×2 IMPLANT
LUBRICANT JELLY K Y 4OZ (MISCELLANEOUS) ×2 IMPLANT
NEEDLE HYPO 22GX1.5 SAFETY (NEEDLE) ×2 IMPLANT
PACK BASIC VI WITH GOWN DISP (CUSTOM PROCEDURE TRAY) ×2 IMPLANT
PAD ABD 8X10 STRL (GAUZE/BANDAGES/DRESSINGS) ×2 IMPLANT
PAD POSITIONING PINK XL (MISCELLANEOUS) ×2 IMPLANT
RETRACTOR LONE STAR DISPOSABLE (INSTRUMENTS) IMPLANT
RETRACTOR STAY HOOK 5MM (MISCELLANEOUS) IMPLANT
SCISSORS LAP 5X35 DISP (ENDOMECHANICALS) ×2 IMPLANT
SET IRRIG TUBING LAPAROSCOPIC (IRRIGATION / IRRIGATOR) ×2 IMPLANT
SHEARS HARMONIC ACE PLUS 36CM (ENDOMECHANICALS) ×2 IMPLANT
STOPCOCK 4 WAY LG BORE MALE ST (IV SETS) ×2 IMPLANT
SUT CHROMIC 3 0 SH 27 (SUTURE) IMPLANT
SUT PDS AB 2-0 CT2 27 (SUTURE) IMPLANT
SUT PDS AB 3-0 SH 27 (SUTURE) IMPLANT
SUT SILK 2 0 (SUTURE)
SUT SILK 2 0 SH CR/8 (SUTURE) IMPLANT
SUT SILK 2-0 18XBRD TIE 12 (SUTURE) IMPLANT
SUT SILK 3 0 SH 30 (SUTURE) IMPLANT
SUT SILK 3 0 SH CR/8 (SUTURE) IMPLANT
SUT V-LOC BARB 180 2/0GR6 GS22 (SUTURE)
SUT VIC AB 2-0 UR6 27 (SUTURE) ×4 IMPLANT
SUT VIC AB 3-0 SH 27 (SUTURE)
SUT VIC AB 3-0 SH 27XBRD (SUTURE) IMPLANT
SUTURE V-LC BRB 180 2/0GR6GS22 (SUTURE) IMPLANT
SYR 20CC LL (SYRINGE) ×2 IMPLANT
SYR BULB IRRIGATION 50ML (SYRINGE) IMPLANT
TAPE CLOTH SURG 6X10 WHT LF (GAUZE/BANDAGES/DRESSINGS) ×2 IMPLANT
TOWEL OR 17X26 10 PK STRL BLUE (TOWEL DISPOSABLE) ×2 IMPLANT
TOWEL OR NON WOVEN STRL DISP B (DISPOSABLE) ×2 IMPLANT
TUBING CONNECTING 10 (TUBING) IMPLANT
TUBING INSUF HEATED (TUBING) ×2 IMPLANT
YANKAUER SUCT BULB TIP 10FT TU (MISCELLANEOUS) IMPLANT

## 2018-05-29 NOTE — Anesthesia Postprocedure Evaluation (Signed)
Anesthesia Post Note  Patient: Alexandra Cabrera  Procedure(s) Performed: PARTIAL PROCTECTOMY BY TEM EXCISION OF ANAL TAG (N/A Rectum)     Patient location during evaluation: PACU Anesthesia Type: General Level of consciousness: awake and alert Pain management: pain level controlled Vital Signs Assessment: post-procedure vital signs reviewed and stable Respiratory status: spontaneous breathing, nonlabored ventilation, respiratory function stable and patient connected to nasal cannula oxygen Cardiovascular status: blood pressure returned to baseline and stable Postop Assessment: no apparent nausea or vomiting Anesthetic complications: no    Last Vitals:  Vitals:   05/29/18 1445 05/29/18 1525  BP: 119/89 125/80  Pulse: 91 78  Resp: 18 20  Temp:  (!) 36.3 C  SpO2: 100% 100%    Last Pain:  Vitals:   05/29/18 1525  TempSrc:   PainSc: 3                  Andrue Dini P Heylee Tant

## 2018-05-29 NOTE — Op Note (Signed)
05/29/2018  2:00 PM  PATIENT:  Alexandra Cabrera  52 y.o. female  Patient Care Team: Mosie Lukes, MD as PCP - General (Family Medicine) Vernie Ammons, MD as Referring Physician (Dermatology) Michael Boston, MD as Consulting Physician (General Surgery) Jackquline Denmark, MD as Consulting Physician (Gastroenterology)  PRE-OPERATIVE DIAGNOSIS:  Polyp in rectum  POST-OPERATIVE DIAGNOSIS:    Polyp in rectum Posterior midline anal tag.  Probable sentinel tag from prior anal fissure.  PROCEDURE:  PARTIAL PROCTECTOMY BY TEM EXCISION OF ANAL TAG  SURGEON:  Adin Hector, MD  ASSISTANT: Carlena Hurl, PA-C  ANESTHESIA:   local and general  EBL:  Total I/O In: -  Out: 120 [Urine:100; Blood:20]  Delay start of Pharmacological VTE agent (>24hrs) due to surgical blood loss or risk of bleeding:  no  DRAINS: none   SPECIMEN:  Source of Specimen:  1.  Distal rectal polyp  2.  Anal tag  DISPOSITION OF SPECIMEN:  PATHOLOGY  COUNTS:  YES  PLAN OF CARE: Admit for overnight observation  PATIENT DISPOSITION:  PACU - hemodynamically stable.  INDICATION: Pleasant woman found to have large distal rectal polyp.  On anoderm.  Felt not to be safely involved by colonoscopy.  Surgical consultation made.  Recommendation for transanal TEM partial proctectomy.  The anatomy & physiology of the digestive tract was discussed.  The pathophysiology of the rectal pathology was discussed.  Natural history risks without surgery was discussed.   I feel the risks of no intervention will lead to serious problems that outweigh the operative risks; therefore, I recommended surgery.    Laparoscopic & open abdominal techniques were discussed.  I recommended we start with a partial proctectomy by transanal endoscopic microsurgery (TEM) for excisional biopsy to remove the pathology and hopefully cure and/or control the pathology.  This technique can offer less operative risk and faster post-operative recovery.   Possible need for immediate or later abdominal surgery for further treatment was discussed.   Risks such as bleeding, abscess, reoperation, ostomy, heart attack, death, and other risks were discussed.   I noted a good likelihood this will help address the problem.  Goals of post-operative recovery were discussed as well.  We will work to minimize complications.  An educational handout was given as well.  Questions were answered.  The patient expresses understanding & wishes to proceed with surgery.  OR FINDINGS: Sessile mobile large distal rectal polyp.  Primarily right posterior lateral wall involving the circumference.  The resulting mass was 6x5 cm in size  Pin placement on pathology specimen: Proximal margin: Purple Distal margin (initial): Red Right anterior: White Left posterior:  Yellow   Additional distal margin of anal canal made.   Final distal margin: blue pins Anterior right margin white pin Posterior left margin: yellow pin.  The closure rests 0-2 cm from the anal verge in the right lateral and posterior location (5 'oclock to 11 o'clock in standard lithotomy).  It is 50% of the circumference.  DESCRIPTION: Informed consent was confirmed.  Patient received general anesthesia without difficulty.  Foley catheter sterilely placed.  Sequential compression devices active during the entire case.  Digital rectal exam confirmed mobile polyp in right posterior lateral rectal circumference.  Patient positioned right side down decubitus, taking extra care to secure and protect the patient appropriately.  The perineum and perianal regions were prepped and draped in a sterile fashion.  Surgical timeout confirmed our plan.  I did a gentle digital rectal examination with gradual anal dilation to  allow placement of the 4 cm TEO Stortz scope.  This was secured to the bed using the TEO clamping system.  We induced carbon dioxide insufflation intraluminally.  I oriented the Stortz scope and the  patient such that the specimen rested towards the floor at the 6:00 position.  I could easily identify the mass.  I went ahead and used tip point cautery to mark 1 cm margins circumferentially.  I then did a full thickness transection at the distal margin.  This was at the mid anal canal at the level of the anal crypts.  I came around laterally.  Switched over to harmonic dissection.  Lifted the specimen off the sphincter complex for a good deep margin.  I gradually came more proximally.  Transected at the proximal margin.  I ensured hemostasis.  I inspected the main specimen and pinned it on thick cork board.  Pins as noted above.  I walked the specimen down to pathology and showed the [facility] pathology team for proper orientation.  After pinning, and seem the distal margin was somewhat close.   I went back in and scrubbed in.   I went ahead and excised another 5 mm of anal canal from anterior to posterior margin involving about a third of the circumference to get a the better final distal margin.  Hemostasis was good.  I reapproximated the wound with a 2-0 Vicryl horizontal mattress stitch to bring the middle part of the rectum down to help close the middle of the wound transversely.  I then closed the wound transversely using a 2-0 Vicryl interrupted running Connel stitches.  This brought things together well.  I did meticulous inspection with fine tip instruments to confirm good watertight closure   Hemostasis excellent.  The lumen was quite patent.  Carbon dioxide evacuated & instruments removed.  The patient is being extubated go to recovery room.  Discussed postop care in detail with the patient sister, and her husband in the holding area.  I discussed operative findings, updated the patient's status, discussed probable steps to recovery, and gave postoperative recommendations to the patient's spouse.  Recommendations were made.  Questions were answered.  He expressed understanding &  appreciation.   Adin Hector, MD, FACS, MASCRS Gastrointestinal and Minimally Invasive Surgery    [address] [place], [place] [address] [phone] Main / Paging [phone] Fax

## 2018-05-29 NOTE — Interval H&P Note (Signed)
History and Physical Interval Note:  05/29/2018 11:33 AM  Alexandra Cabrera  has presented today for surgery, with the diagnosis of Polyp in rectum  The various methods of treatment have been discussed with the patient and family. After consideration of risks, benefits and other options for treatment, the patient has consented to  Procedure(s): PARTIAL PROCTECTOMY BY TEM ERAS PATHWAY (N/A) as a surgical intervention .  The patient's history has been reviewed, patient examined, no change in status, stable for surgery.  I have reviewed the patient's chart and labs.  Questions were answered to the patient's satisfaction.    I have re-reviewed the the patient's records, history, medications, and allergies.  I have re-examined the patient.  I again discussed intraoperative plans and goals of post-operative recovery.  The patient agrees to proceed.  Alexandra Cabrera  October 02, 1965 [id number]  Patient Care Team: Mosie Lukes, MD as PCP - General (Family Medicine) Vernie Ammons, MD as Referring Physician (Dermatology) Michael Boston, MD as Consulting Physician (General Surgery) Jackquline Denmark, MD as Consulting Physician (Gastroenterology)  Patient Active Problem List   Diagnosis Date Noted  . Adenomatous rectal polyp 05/29/2018  . Seasonal allergies 01/07/2018  . Right leg pain 03/19/2017  . Hyperlipidemia, mixed 05/17/2016  . Acne 11/01/2015  . Rash and nonspecific skin eruption 11/01/2015  . IBS (irritable bowel syndrome) 01/03/2015  . Preventative health care 01/03/2015  . S/P hysterectomy 09/09/2012  . Menopause 09/09/2012  . Hot flushes, perimenopausal 09/09/2012  . Family history of breast cancer in female 09/09/2012  . Chronic interstitial cystitis 08/10/2011  . Migraine 09/24/2007  . ENDOMETRIOSIS 09/24/2007  . ARTHRITIS 09/24/2007  . NEPHROLITHIASIS, HX OF 09/24/2007    Past Medical History:  Diagnosis Date  . Acne 11/01/2015  . Allergy   . Arthritis   . Asthma    childhood and  rare as adult  . Blood transfusion without reported diagnosis   . Chronic kidney disease   . Family history of adverse reaction to anesthesia    " my sisiter has had Malignant hypertheria twice, I have never had that problem"; (patient had sevo and succinylcholine with no apparent complication on 10/93/23 at [facility])  . GERD (gastroesophageal reflux disease)   . Hyperlipidemia, mixed 05/17/2016   Patient denies  . IBS (irritable bowel syndrome) 01/03/2015  . Kidney stones   . Migraines   . PONV (postoperative nausea and vomiting)    "difficult to wake up."  . Preventative health care 01/03/2015  . Rash and nonspecific skin eruption 11/01/2015    Past Surgical History:  Procedure Laterality Date  . ABDOMINAL HYSTERECTOMY    . APPENDECTOMY    . CHOLECYSTECTOMY N/A 10/12/2014   Procedure: LAPAROSCOPIC CHOLECYSTECTOMY WITH INTRAOPERATIVE CHOLANGIOGRAM;  Surgeon: Doreen Salvage, MD;  Location: [facility];  Service: General;  Laterality: N/A;  . COLONOSCOPY    . HEMORROIDECTOMY     young adult  . TOTAL KNEE ARTHROPLASTY     8 knee replacements, 28 surgeries on right knee    Social History   Socioeconomic History  . Marital status: Married    Spouse name: Not on file  . Number of children: Not on file  . Years of education: Not on file  . Highest education level: Not on file  Occupational History  . Not on file  Social Needs  . Financial resource strain: Not on file  . Food insecurity:    Worry: Not on file    Inability: Not on file  .  Transportation needs:    Medical: Not on file    Non-medical: Not on file  Tobacco Use  . Smoking status: Never Smoker  . Smokeless tobacco: Never Used  Substance and Sexual Activity  . Alcohol use: No  . Drug use: No  . Sexual activity: Yes    Birth control/protection: Surgical    Comment: lives with husband, works as Marine scientist in [place], no dietawry restrictions  Lifestyle  . Physical activity:    Days per week: Not on file    Minutes per  session: Not on file  . Stress: Not on file  Relationships  . Social connections:    Talks on phone: Not on file    Gets together: Not on file    Attends religious service: Not on file    Active member of club or organization: Not on file    Attends meetings of clubs or organizations: Not on file    Relationship status: Not on file  . Intimate partner violence:    Fear of current or ex partner: Not on file    Emotionally abused: Not on file    Physically abused: Not on file    Forced sexual activity: Not on file  Other Topics Concern  . Not on file  Social History Narrative  . Not on file    Family History  Problem Relation Age of Onset  . Diabetes Mother   . Heart disease Mother        MI  . Arthritis Father   . COPD Father        smoker  . Hypertension Father   . Hyperlipidemia Father   . Arthritis Sister   . Arthritis Brother   . Cancer Maternal Grandmother 45       ovarian  . Arthritis Paternal Grandmother   . Heart disease Paternal Grandfather        MI  . Colon cancer Neg Hx   . Esophageal cancer Neg Hx   . Liver cancer Neg Hx   . Pancreatic cancer Neg Hx   . Rectal cancer Neg Hx   . Stomach cancer Neg Hx     Facility-Administered Medications Prior to Admission  Medication Dose Route Frequency Provider Last Rate Last Dose  . 0.9 %  sodium chloride infusion  500 mL Intravenous Once Jackquline Denmark, MD       Medications Prior to Admission  Medication Sig Dispense Refill Last Dose  . amoxicillin (AMOXIL) 500 MG capsule Take 2,000 mg by mouth See admin instructions. Take 2000 mg by mouth 1 hour prior to dental appointment  2   . cetirizine (ZYRTEC) 10 MG tablet Take 1 tablet (10 mg total) by mouth daily. (Patient taking differently: Take 10 mg by mouth daily as needed for allergies. ) 30 tablet 1 02/02/2018  . estradiol (ESTRACE) 2 MG tablet Take 1 tablet (2 mg total) by mouth daily. 30 tablet 5 05/29/2018 at 0600  . ibuprofen (ADVIL,MOTRIN) 800 MG tablet TAKE 1  TABLET BY MOUTH AT ONSET OF MIGRAINE MAY REPEAT IN 6 HOURS AS NEEDED (Patient taking differently: Take 800 mg by mouth every 6 (six) hours as needed for headache or moderate pain. ) 30 tablet 5 Unknown  . montelukast (SINGULAIR) 10 MG tablet Take 1 tablet (10 mg total) by mouth at bedtime. (Patient taking differently: Take 10 mg by mouth daily as needed (for allergies). ) 30 tablet 3 Unknown  . Multiple Vitamin (MULTIVITAMIN WITH MINERALS) TABS tablet Take 1 tablet by  mouth daily.   02/02/2018  . ondansetron (ZOFRAN) 4 MG tablet TAKE 1 TABLET (4 MG TOTAL) BY MOUTH EVERY 8 HOURS AS NEEDED FOR NAUSEA (Patient taking differently: Take 4 mg by mouth every 8 (eight) hours as needed for nausea or vomiting. ) 20 tablet 2 Unknown  . rizatriptan (MAXALT) 10 MG tablet Take 1 tablet (10 mg total) by mouth as needed for migraine. May repeat in 2 hours if needed 10 tablet 5 Unknown  . SUMAtriptan (IMITREX) 50 MG tablet TAKE 1 TABLET BY MOUTH AT HEADACHE ONSET. MAY REPEAT IN 2 HOURS ONE TIME ONLY (Patient taking differently: Take 50 mg by mouth every 2 (two) hours as needed for migraine or headache. MAY REPEAT IN 2 HOURS ONE TIME ONLY) 9 tablet 5 02/03/2018  . topiramate (TOPAMAX) 100 MG tablet Take 1 tablet (100 mg total) by mouth 2 (two) times daily. 60 tablet 5 05/29/2018 at 0600    Current Facility-Administered Medications  Medication Dose Route Frequency Provider Last Rate Last Dose  . bisacodyl (DULCOLAX) EC tablet 20 mg  20 mg Oral Once Michael Boston, MD      . bupivacaine liposome (EXPAREL) 1.3 % injection 266 mg  20 mL Infiltration On Call to OR Michael Boston, MD      . cefoTEtan (CEFOTAN) 2 g in sodium chloride 0.9 % 100 mL IVPB  2 g Intravenous On Call to OR Michael Boston, MD      . Chlorhexidine Gluconate Cloth 2 % PADS 6 each  6 each Topical Once Michael Boston, MD      . clindamycin (CLEOCIN) 900 mg, gentamicin (GARAMYCIN) 240 mg in sodium chloride 0.9 % 1,000 mL for intraperitoneal lavage    Intraperitoneal To OR Michael Boston, MD      . lactated ringers infusion   Intravenous Continuous Ellender, Karyl Kinnier, MD         Allergies  Allergen Reactions  . Latex Anaphylaxis  . Reglan [Metoclopramide] Anxiety    BP 116/80   Pulse 85   Temp 98.2 F (36.8 C) (Oral)   Resp 16   Ht 5' (1.524 m)   Wt 54.5 kg   SpO2 100%   BMI 23.47 kg/m   Labs: No results found for this or any previous visit (from the past 48 hour(s)).  Imaging / Studies: No results found.   Adin Hector, M.D., F.A.C.S. Gastrointestinal and Minimally Invasive Surgery [facility] [address] [place], [place] [address] [phone] Main / Paging  05/29/2018 11:33 AM    Adin Hector

## 2018-05-29 NOTE — Anesthesia Procedure Notes (Signed)
Procedure Name: Intubation Date/Time: 05/29/2018 12:08 PM Performed by: Niel Hummer, CRNA Pre-anesthesia Checklist: Patient identified, Emergency Drugs available, Suction available and Patient being monitored Patient Re-evaluated:Patient Re-evaluated prior to induction Oxygen Delivery Method: Circle system utilized Preoxygenation: Pre-oxygenation with 100% oxygen Induction Type: IV induction Ventilation: Mask ventilation without difficulty Laryngoscope Size: Mac and 3 Grade View: Grade II Tube type: Oral Number of attempts: 1 Airway Equipment and Method: Stylet Placement Confirmation: ETT inserted through vocal cords under direct vision,  positive ETCO2 and breath sounds checked- equal and bilateral Secured at: 20 cm Tube secured with: Tape Dental Injury: Teeth and Oropharynx as per pre-operative assessment

## 2018-05-29 NOTE — H&P (Signed)
Alexandra Cabrera  DOB: 1966/01/20   History of Present Illness  Patient words: CC: Referral by Dr. Lyndel Safe with [facility] for endoscopically unresectable polyp in distal rectum  HPI: Alexandra Cabrera is a very pleasant 52yoF with hx of seasonal allergies, migraine headaches whom underwent her first colonoscopy with Dr. Lyndel Safe 02/04/18. Findings notable for TA in scending colon (removed) as well as an approximately 3cm polyp in the distal rectum which extended down to the dentate approximately 1cm from it based on endoscopic images; soft, biopsies showed tubular adenoma without dysplasia. She was referred for further evaluation. She denies any symptoms related to this including blood in stool. She normally moves her bowels every day with a help of a stool softener. She has a history of hemorrhoids and had hemorrhoid surgery when she was younger. It was quite painful so she's tried to be compliant on a bowel regimen. No incontinence or leaking. She is rather physically active. She works as an Health visitor at [employer]  No personal nor family history of GI/colon cancer, inflammatory bowel disease, irritable bowel syndrome, allergy such as Celiac Sprue, dietary/dairy problems, colitis, ulcers nor gastritis. No recent sick contacts/gastroenteritis. No travel outside the country. No changes in diet. No dysphagia to solids or liquids. No significant heartburn or reflux. No hematochezia, hematemesis, coffee ground emesis. No evidence of prior gastric/peptic ulceration.  No new events.  Ready for surgery.  PMH: Seasonal allergies Migraine headaches  PSH: Remote hemorrhoidectomy; denies any other anorectal procedures  FHx: Denies FHx of malignancy  Social: Denies use of tobacco/EtOH/drugs. She works at [employer] as an Health visitor  ROS: A comprehensive 10 system review of systems was completed with the patient and pertinent findings as noted above.  The patient is a 52 year old  female.   Allergies Geni Bers Haggett; 04/05/2018 2:08 PM) Latex Exam Gloves *MEDICAL DEVICES AND SUPPLIES* Phenergan *ANTIHISTAMINES* Dilaudid *ANALGESICS - OPIOID* Reglan *GASTROINTESTINAL AGENTS - MISC.* Allergies Reconciled  Medication History Geni Bers Haggett; 04/05/2018 2:08 PM) Estradiol (2MG  Tablet, Oral) Active. Topiramate (25MG  Tablet, Oral) Active. Topiramate (50MG  Tablet, Oral) Active. Imitrex (50MG  Tablet, Oral) Active. Topamax (50MG  Tablet, Oral) Active. Estrace (2MG  Tablet, Oral) Active. Multivitamins (Oral) Active. Maxalt-MLT (10MG  Tablet Disint, Oral) Active. Medications Reconciled    Vitals Geni Bers Haggett; 04/05/2018 2:09 PM) 04/05/2018 2:08 PM Weight: 124.4 lb Height: 60in Body Surface Area: 1.53 m Body Mass Index: 24.29 kg/m  Temp.: 97.74F(Temporal)  Pulse: 77 (Regular)  P.OX: 100% (Room air) BP: 100/62 (Sitting, Left Arm, Standard)  BP 116/80   Pulse 85   Temp 98.2 F (36.8 C) (Oral)   Resp 16   Ht 5' (1.524 m)   Wt 54.5 kg   SpO2 100%   BMI 23.47 kg/m      Physical Exam   General Mental Status-Alert. General Appearance-Not in acute distress, Not Sickly. Orientation-Oriented X3. Hydration-Well hydrated. Voice-Normal.  Integumentary Global Assessment Upon inspection and palpation of skin surfaces of the - Axillae: non-tender, no inflammation or ulceration, no drainage. and Distribution of scalp and body hair is normal. General Characteristics Temperature - normal warmth is noted.  Head and Neck Head-normocephalic, atraumatic with no lesions or palpable masses. Face Global Assessment - atraumatic, no absence of expression. Neck Global Assessment - no abnormal movements, no bruit auscultated on the right, no bruit auscultated on the left, no decreased range of motion, non-tender. Trachea-midline. Thyroid Gland Characteristics - non-tender.  Eye Eyeball -  Left-Extraocular movements intact, No Nystagmus. Eyeball - Right-Extraocular movements intact,  No Nystagmus. Cornea - Left-No Hazy. Cornea - Right-No Hazy. Sclera/Conjunctiva - Left-No scleral icterus, No Discharge. Sclera/Conjunctiva - Right-No scleral icterus, No Discharge. Pupil - Left-Direct reaction to light normal. Pupil - Right-Direct reaction to light normal.  ENMT Ears Pinna - Left - no drainage observed, no generalized tenderness observed. Right - no drainage observed, no generalized tenderness observed. Nose and Sinuses External Inspection of the Nose - no destructive lesion observed. Inspection of the nares - Left - quiet respiration. Right - quiet respiration. Mouth and Throat Lips - Upper Lip - no fissures observed, no pallor noted. Lower Lip - no fissures observed, no pallor noted. Nasopharynx - no discharge present. Oral Cavity/Oropharynx - Tongue - no dryness observed. Oral Mucosa - no cyanosis observed. Hypopharynx - no evidence of airway distress observed.  Chest and Lung Exam Inspection Movements - Normal and Symmetrical. Accessory muscles - No use of accessory muscles in breathing. Palpation Palpation of the chest reveals - Non-tender. Auscultation Breath sounds - Normal and Clear.  Breast Breast - Left-Symmetric, Non Tender, No Biopsy scars, no Dimpling, No Inflammation, No Lumpectomy scars, No Mastectomy scars, No Peau d' Orange. Breast - Right-Symmetric, Non Tender, No Biopsy scars, no Dimpling, No Inflammation, No Lumpectomy scars, No Mastectomy scars, No Peau d' Orange. Breast Lump-No Palpable Breast Mass.  Cardiovascular Auscultation Rhythm - Regular. Murmurs & Other Heart Sounds - Auscultation of the heart reveals - No Murmurs and No Systolic Clicks.  Abdomen Inspection Inspection of the abdomen reveals - No Visible peristalsis and No Abnormal pulsations. Umbilicus - No Bleeding, No Urine  drainage. Palpation/Percussion Palpation and Percussion of the abdomen reveal - Soft, Non Tender, No Rebound tenderness, No Rigidity (guarding) and No Cutaneous hyperesthesia. Note: Abdomen soft. Not severely distended. No distasis recti. No umbilical or other anterior abdominal wall hernias  Female Genitourinary Sexual Maturity Tanner 5 - Adult hair pattern. Note: No inguinal hernias. No lymphadenopathy. No vaginal bleeding nor discharge  Rectal Note: Partially prolapsed hemorrhoidal tissue right posterior.  Tolerates digital rectal exam. Sensitive been intact sphincter without stricturing. Soft, mobile, polypoid mass on right lateral rectal wall involving about at least a quarter of the circumference. Feels mobile. 1-4 cm from the anal verge. No anterior or left lateral rectal wall masses.  No fissure. No fistula. No abscess. No stricture. No pruritus. No pilonidal disease.  Peripheral Vascular Upper Extremity Inspection - Left - No Cyanotic nailbeds, Not Ischemic. Right - No Cyanotic nailbeds, Not Ischemic.  Neurologic Neurologic evaluation reveals -normal attention span and ability to concentrate, able to name objects and repeat phrases. Appropriate fund of knowledge , normal sensation and normal coordination. Mental Status Affect - not angry, not paranoid. Cranial Nerves-Normal Bilaterally. Gait-Normal.  Neuropsychiatric Mental status exam performed with findings of-able to articulate well with normal speech/language, rate, volume and coherence, thought content normal with ability to perform basic computations and apply abstract reasoning and no evidence of hallucinations, delusions, obsessions or homicidal/suicidal ideation.  Musculoskeletal Global Assessment Spine, Ribs and Pelvis - no instability, subluxation or laxity. Right Upper Extremity - no instability, subluxation or laxity.  Lymphatic Head & Neck  General Head & Neck Lymphatics: Bilateral  - Description - No Localized lymphadenopathy. Axillary  General Axillary Region: Bilateral - Description - No Localized lymphadenopathy. Femoral & Inguinal  Generalized Femoral & Inguinal Lymphatics: Left - Description - No Localized lymphadenopathy. Right - Description - No Localized lymphadenopathy.    Assessment & Plan   ADENOMATOUS RECTAL POLYP (D12.8) Impression: Adenomatous rectal polyp with dysplasia  involving the right lateral posterior rectal wall.  I think she would benefit from resection. Reasonable candidate to do TEM approach. Right-side-down decubitus positioning. May have to do a combinational excision distally with [place] retractor. We will see.  The anatomy & physiology of the digestive tract was discussed.  The pathophysiology of the rectal pathology was discussed.  Natural history risks without surgery was discussed.   I feel the risks of no intervention will lead to serious problems that outweigh the operative risks; therefore, I recommended surgery.    Laparoscopic & open abdominal techniques were discussed.  I recommended we start with a partial proctectomy by transanal endoscopic microsurgery (TEM) for excisional biopsy to remove the pathology and hopefully cure and/or control the pathology.  This technique can offer less operative risk and faster post-operative recovery.  Possible need for immediate or later abdominal surgery for further treatment was discussed.   Risks such as bleeding, abscess, reoperation, ostomy, heart attack, death, and other risks were discussed.   I noted a good likelihood this will help address the problem.  Goals of post-operative recovery were discussed as well.  We will work to minimize complications.  An educational handout was given as well.  Questions were answered.  The patient expresses understanding & wishes to proceed with surgery.     Adin Hector, MD, FACS, MASCRS Gastrointestinal and Minimally Invasive  Surgery    [address] [place], [place] [address] [phone] Main / Paging

## 2018-05-29 NOTE — Discharge Instructions (Signed)
ANORECTAL SURGERY:  °POST OPERATIVE INSTRUCTIONS ° °###################################################################### ° °EAT °Start with a pureed / full liquid diet °After 24 hours, gradually transition to a high fiber diet.   ° °CONTROL PAIN °Control pain so you can tolerate bowel movements,  °walk, sleep, tolerate sneezing/coughing, and go up/down stairs. ° ° °HAVE A BOWEL MOVEMENT DAILY °Keep your bowels regular to avoid problems.   °Taking a fiber supplement every day to keep bowels soft.   °Try a laxative to override constipation. °Use an antidairrheal to slow down diarrhea.   °Call if not better after 2 tries ° °WALK °Walk an hour a day.  Control your pain to do that. °  °CALL IF YOU HAVE PROBLEMS/CONCERNS °Call if you are still struggling despite following these instructions. °Call if you have concerns not answered by these instructions ° °###################################################################### ° ° ° °1. Take your usually prescribed home medications unless otherwise directed. °2. DIET: Follow a light bland diet the first 24 hours after arrival home, such as soup, liquids, crackers, etc.  Be sure to include lots of fluids daily.  Avoid fast food or heavy meals as your are more likely to get nauseated.  Eat a low fat the next few days after surgery.   °3. PAIN CONTROL: °a. Pain is best controlled by a usual combination of three different methods TOGETHER: °i. Ice/Heat °ii. Over the counter pain medication °iii. Prescription pain medication °b. Expect swelling and discomfort in the anus/rectal area.  Warm water baths (30-60 minutes up to 6 times a day, especially after bowel meovements) will help. Use ice for the first few days to help decrease swelling and bruising, then switch to heat such as warm towels, sitz baths, warm baths, etc to help relax tight/sore spots and speed recovery.  Some people prefer to use ice alone, heat alone, alternating between ice & heat.  Experiment to what works  for you.   °c. It is helpful to take an over-the-counter pain medication continuously for the first few weeks.  Choose one of the following that works best for you: °i. Naproxen (Aleve, etc)  Two 220mg tabs twice a day °ii. Ibuprofen (Advil, etc) Three 200mg tabs four times a day (every meal & bedtime) °iii. Acetaminophen (Tylenol, etc) 500-650mg four times a day (every meal & bedtime) °d. A  prescription for pain medication (such as oxycodone, hydrocodone, etc) should be given to you upon discharge.  Take your pain medication as prescribed.  °i. If you are having problems/concerns with the prescription medicine (does not control pain, nausea, vomiting, rash, itching, etc), please call us [phone] to see if we need to switch you to a different pain medicine that will work better for you and/or control your side effect better. °ii. If you need a refill on your pain medication, please contact your pharmacy.  They will contact our office to request authorization. Prescriptions will not be filled after 5 pm or on week-ends.  If can take up to 48 hours for it to be filled & ready so avoid waiting until you are down to thel ast pill. °e. A topical cream (Dibucaine) or a prescription for a cream (such as diltiazem 2% gel) may be given to you.  Many people find relief with topical creams.  Some people find it burns too much.  Experiment.  If it helps, use it.  If it burns, don't using it. ° °Use a Sitz Bath 4-8 times a day for relief ° ° °Sitz Bath °A sitz bath   is a warm water bath taken in the sitting position that covers only the hips and buttocks. It may be used for either healing or hygiene purposes. Sitz baths are also used to relieve pain, itching, or muscle spasms. The water may contain medicine. Moist heat will help you heal and relax.  °HOME CARE INSTRUCTIONS  °Take 3 to 4 sitz baths a day. °1. Fill the bathtub half full with warm water. °2. Sit in the water and open the drain a little. °3. Turn on the warm  water to keep the tub half full. Keep the water running constantly. °4. Soak in the water for 15 to 20 minutes. °5. After the sitz bath, pat the affected area dry first. ° ° °4. KEEP YOUR BOWELS REGULAR °a. The goal is one soft bowel movement a day °b. Avoid getting constipated.  Between the surgery and the pain medications, it is common to experience some constipation.  Increasing fluid intake and taking a fiber supplement (such as Metamucil, Citrucel, FiberCon, MiraLax, etc) 2-3 times a day regularly will usually help prevent this problem from occurring.  A mild laxative (prune juice, Milk of Magnesia, MiraLax, etc) should be taken according to package directions if there are no bowel movements after 48 hours. °c. Watch out for diarrhea.  If you have many loose bowel movements, simplify your diet to bland foods & liquids for a few days.  Stop any stool softeners and decrease your fiber supplement.  Switching to mild anti-diarrheal medications (Kayopectate, Pepto Bismol) can help.  Can try an imodium/loperamide dose.  If this worsens or does not improve, please call us. ° °5. Wound Care ° °a. Remove your bandages with your first bowel movement, usually the day after surgery.  You may have packing if you had an abscess.  Let any packing or gauze fall come out.   °b. Wear an absorbent pad or soft cotton balls in your underwear as needed to catch any drainage and help keep the area  °c. Keep the area clean and dry.  Bathe / shower every day.  Keep the area clean by showering / bathing over the incision / wound.   It is okay to soak an open wound to help wash it.  Consider using a squeeze bottle filled with warm water to gently wash the anal area.  Wet wipes or showers / gentle washing after bowel movements is often less traumatic than regular toilet paper. °d. You will often notice bleeding with bowel movements.  This should slow down by the end of the first week of surgery.  Sitting on an ice pack can  help. °e. Expect some drainage.  This should slow down by the end of the first week of surgery, but you will have occasional bleeding or drainage up to a few months after surgery.  Wear an absorbent pad or soft cotton gauze in your underwear until the drainage stops. ° °6. ACTIVITIES as tolerated:   °a. You may resume regular (light) daily activities beginning the next day--such as daily self-care, walking, climbing stairs--gradually increasing activities as tolerated.  If you can walk 30 minutes without difficulty, it is safe to try more intense activity such as jogging, treadmill, bicycling, low-impact aerobics, swimming, etc. °b. Save the most intensive and strenuous activity for last such as sit-ups, heavy lifting, contact sports, etc  Refrain from any heavy lifting or straining until you are off narcotics for pain control.   °c. DO NOT PUSH THROUGH PAIN.  Let pain   be your guide: If it hurts to do something, don't do it.  Pain is your body warning you to avoid that activity for another week until the pain goes down. d. You may drive when you are no longer taking prescription pain medication, you can comfortably sit for long periods of time, and you can safely maneuver your car and apply brakes. e. Dennis Bast may have sexual intercourse when it is comfortable.  7. FOLLOW UP in our office a. Please call [facility] at [phone] to set up an appointment to see your surgeon in the office for a follow-up appointment approximately 2-3 weeks after your surgery. b. Make sure that you call for this appointment the day you arrive home to ensure a convenient appointment time.  8. IF YOU HAVE DISABILITY OR FAMILY LEAVE FORMS, BRING THEM TO THE OFFICE FOR PROCESSING.  DO NOT GIVE THEM TO YOUR DOCTOR.        WHEN TO CALL us [phone]: 1. Poor pain control 2. Reactions / problems with new medications (rash/itching, nausea, etc)  3. Fever over 101.5 F (38.5 C) 4. Inability to urinate 5. Nausea and/or  vomiting 6. Worsening swelling or bruising 7. Continued bleeding from incision. 8. Increased pain, redness, or drainage from the incision  The clinic staff is available to answer your questions during regular business hours (8:30am-5pm).  Please dont hesitate to call and ask to speak to one of our nurses for clinical concerns.   A surgeon from [facility] is always on call at the hospitals   If you have a medical emergency, go to the nearest emergency room or call 911.    [facility], [place], [place], [place],   [address] ? MAIN: [phone] ? TOLL FREE: [phone] ? FAX [phone] [email or web address]   TRANSANAL ENDOSCOPIC MICROSURGERY (TEM)  Transanal endoscopic microsurgery (TEM) was developed as a means to provide a less invasive way to operate on the rectum.  This may needed to provide a good resection of part of the rectal wall to remove a large pre-cancerous polyp or an early cancer in which the patient cannot tolerate an open surgery or has an extremely hostile abdomen that makes the resection very risky.    The rectum is the last foot of the tubular digestive tract.  It resides in the pelvis, laying on top of your tailbone.  It is the final reservoir for stool before it is evacuated through the anus in the process of defecation, naturally stretching and contracting to allow time for someone to control bowel function.  The rectum is a common location for polyps or even a cancer.    In most cases when a polyp is found in the colon or rectum, a person often does not need to have a large resection of the rectum, but have it excised by a colonoscope.  However, some polyps are too large to be safely removed through endoscopy and require surgery.  Classically, this is done through an open incision where a low anterior resection or abdominoperineal resection where part or the entire rectum is removed.  Often, only part  of a wall of the rectum needs to be removed.   Transanal endoscopic microsurgery (TEM) was developed as a means to provide a less invasive way to operate on the rectum.    Transanal endoscopic microsurgery (TEM) involves the patient to be placed under complete general anesthesia.  The anus is gently dilated and a metal tube is  placed into the rectum.  Through the tube, absorbable gas is infused and long instruments are used to help access and cut out the polyp or tumor from part of the rectum.  The long instruments are also used to help sew the wound shut.  The specimen is then sent for pathology.  The procedure itself usually takes a few hours of time.  The patient stays at least overnight.  When they can tolerate a regular diet and have adequate pain control they usually leave the hospital in one or two days.    The advantage of TEM is that as opposed to a long hospital stay, patient recovery is much faster.  People  less likely to have bowel or other problems.  Careful pre-operative selection is essential to make sure that the patient is an appropriate candidate for the surgery.  Tumors or cancers that are very large or invasive usually are much more difficult to remove by this technique and are not considered the first option.  Persons who are most appropriate for this surgery are those with large polyps that have not become cancers or early cancers in patients who have high risks with larger surgery.   Sometimes a more aggressive laparoscopic or open follow up resection is needed if a cancer is found to give the best chance at pure.    In general, surgery has a better chance at cure if a cancer is found but may not needed if it is only a precancerous polyp.  Risks to the surgery such as pain, bleeding, abscess, leak, ostomy, and death are inherent but overall the TEM procedure is less stressful and less risky to the patient than a classic colorectal resection throughthe abdomen.  Your colorectal surgeon  can help decide what option is best for you.   Colon Polyps Polyps are tissue growths inside the body. Polyps can grow in many places, including the large intestine (colon). A polyp may be a round bump or a mushroom-shaped growth. You could have one polyp or several. Most colon polyps are noncancerous (benign). However, some colon polyps can become cancerous over time. What are the causes? The exact cause of colon polyps is not known. What increases the risk? This condition is more likely to develop in people who:  Have a family history of colon cancer or colon polyps.  Are older than 35 or older than 45 if they are African American.  Have inflammatory bowel disease, such as ulcerative colitis or Crohn disease.  Are overweight.  Smoke cigarettes.  Do not get enough exercise.  Drink too much alcohol.  Eat a diet that is: ? High in fat and red meat. ? Low in fiber.  Had childhood cancer that was treated with abdominal radiation.  What are the signs or symptoms? Most polyps do not cause symptoms. If you have symptoms, they may include:  Blood coming from your rectum when having a bowel movement.  Blood in your stool.The stool may look dark red or black.  A change in bowel habits, such as constipation or diarrhea.  How is this diagnosed? This condition is diagnosed with a colonoscopy. This is a procedure that uses a lighted, flexible scope to look at the inside of your colon. How is this treated? Treatment for this condition involves removing any polyps that are found. Those polyps will then be tested for cancer. If cancer is found, your health care provider will talk to you about options for colon cancer treatment. Follow these instructions at  home: Diet  Eat plenty of fiber, such as fruits, vegetables, and whole grains.  Eat foods that are high in calcium and vitamin D, such as milk, cheese, yogurt, eggs, liver, fish, and broccoli.  Limit foods high in fat, red  meats, and processed meats, such as hot dogs, sausage, bacon, and lunch meats.  Maintain a healthy weight, or lose weight if recommended by your health care provider. General instructions  Do not smoke cigarettes.  Do not drink alcohol excessively.  Keep all follow-up visits as told by your health care provider. This is important. This includes keeping regularly scheduled colonoscopies. Talk to your health care provider about when you need a colonoscopy.  Exercise every day or as told by your health care provider. Contact a health care provider if:  You have new or worsening bleeding during a bowel movement.  You have new or increased blood in your stool.  You have a change in bowel habits.  You unexpectedly lose weight. This information is not intended to replace advice given to you by your health care provider. Make sure you discuss any questions you have with your health care provider. Document Released: 04/12/2004 Document Revised: 12/23/2015 Document Reviewed: 06/07/2015 Elsevier Interactive Patient Education  [employer].

## 2018-05-29 NOTE — Transfer of Care (Signed)
Immediate Anesthesia Transfer of Care Note  Patient: Alexandra Cabrera  Procedure(s) Performed: PARTIAL PROCTECTOMY BY TEM EXCISION OF ANAL TAG (N/A Rectum)  Patient Location: PACU  Anesthesia Type:General  Level of Consciousness: awake and alert   Airway & Oxygen Therapy: Patient Spontanous Breathing and Patient connected to face mask  Post-op Assessment: Report given to RN and Post -op Vital signs reviewed and stable  Post vital signs: Reviewed and stable  Last Vitals:  Vitals Value Taken Time  BP    Temp    Pulse    Resp    SpO2      Last Pain:  Vitals:   05/29/18 1005  TempSrc: Oral         Complications: No apparent anesthesia complications

## 2018-05-29 NOTE — Anesthesia Preprocedure Evaluation (Addendum)
Anesthesia Evaluation  Patient identified by MRN, date of birth, ID band Patient awake    Reviewed: Allergy & Precautions, NPO status , Patient's Chart, lab work & pertinent test results  History of Anesthesia Complications (+) PONV, Family history of anesthesia reaction and history of anesthetic complications  Airway Mallampati: II  TM Distance: >3 FB Neck ROM: Full    Dental no notable dental hx.    Pulmonary asthma ,    Pulmonary exam normal breath sounds clear to auscultation       Cardiovascular negative cardio ROS Normal cardiovascular exam Rhythm:Regular Rate:Normal  ECG: SR, rate 67   Neuro/Psych  Headaches, negative psych ROS   GI/Hepatic negative GI ROS, Neg liver ROS,   Endo/Other  negative endocrine ROS  Renal/GU negative Renal ROS     Musculoskeletal negative musculoskeletal ROS (+)   Abdominal   Peds  Hematology negative hematology ROS (+)   Anesthesia Other Findings Polyp in rectum  Reproductive/Obstetrics                             Anesthesia Physical Anesthesia Plan  ASA: II  Anesthesia Plan: General   Post-op Pain Management:    Induction: Intravenous  PONV Risk Score and Plan: 4 or greater and Ondansetron, Dexamethasone, Treatment may vary due to age or medical condition and Midazolam  Airway Management Planned: Oral ETT  Additional Equipment:   Intra-op Plan:   Post-operative Plan: Extubation in OR  Informed Consent: I have reviewed the patients History and Physical, chart, labs and discussed the procedure including the risks, benefits and alternatives for the proposed anesthesia with the patient or authorized representative who has indicated his/her understanding and acceptance.   Dental advisory given  Plan Discussed with: CRNA  Anesthesia Plan Comments:        Anesthesia Quick Evaluation

## 2018-05-30 ENCOUNTER — Encounter (HOSPITAL_COMMUNITY): Payer: Self-pay | Admitting: Surgery

## 2018-06-25 ENCOUNTER — Other Ambulatory Visit: Payer: Self-pay | Admitting: Family Medicine

## 2018-06-25 MED FILL — SUMATRIPTAN SUCC 50 MG TAB: 50 | 30 days supply | Qty: 9 | Fill #3

## 2018-06-25 MED FILL — TOPIRAMATE 100 MG TABLET: 100 | 30 days supply | Qty: 60 | Fill #1

## 2018-06-25 MED FILL — ESTRADIOL 2 MG TABS: 2 | 30 days supply | Qty: 30 | Fill #1

## 2018-06-25 MED FILL — RIZATRIPTAN BENZOATE 10 MG: 10 | 30 days supply | Qty: 9 | Fill #3

## 2018-06-26 MED FILL — ONDANSETRON HCL 4 MG TABLET: 4 | 7 days supply | Qty: 20 | Fill #0

## 2018-07-19 MED FILL — RIZATRIPTAN BENZOATE 10 MG: 10 | 30 days supply | Qty: 9 | Fill #4

## 2018-07-19 MED FILL — SUMATRIPTAN SUCC 50 MG TAB: 50 | 30 days supply | Qty: 9 | Fill #4

## 2018-07-19 MED FILL — TOPIRAMATE 100 MG TABLET: 100 | 30 days supply | Qty: 60 | Fill #2

## 2018-07-19 MED FILL — ESTRADIOL 2 MG TABS: 2 | 30 days supply | Qty: 30 | Fill #2

## 2018-07-23 ENCOUNTER — Emergency Department (HOSPITAL_BASED_OUTPATIENT_CLINIC_OR_DEPARTMENT_OTHER): Payer: 59

## 2018-07-23 ENCOUNTER — Other Ambulatory Visit: Payer: Self-pay

## 2018-07-23 ENCOUNTER — Emergency Department (HOSPITAL_BASED_OUTPATIENT_CLINIC_OR_DEPARTMENT_OTHER)
Admission: EM | Admit: 2018-07-23 | Discharge: 2018-07-23 | Disposition: A | Discharge status: Home / Self Care | Payer: 59 | Attending: Emergency Medicine | Admitting: Emergency Medicine

## 2018-07-23 ENCOUNTER — Encounter (HOSPITAL_BASED_OUTPATIENT_CLINIC_OR_DEPARTMENT_OTHER): Payer: Self-pay | Admitting: Emergency Medicine

## 2018-07-23 DIAGNOSIS — J45909 Unspecified asthma, uncomplicated: Secondary | ICD-10-CM | POA: Insufficient documentation

## 2018-07-23 DIAGNOSIS — S82832A Other fracture of upper and lower end of left fibula, initial encounter for closed fracture: Secondary | ICD-10-CM | POA: Insufficient documentation

## 2018-07-23 DIAGNOSIS — N189 Chronic kidney disease, unspecified: Secondary | ICD-10-CM | POA: Insufficient documentation

## 2018-07-23 DIAGNOSIS — Z96651 Presence of right artificial knee joint: Secondary | ICD-10-CM | POA: Diagnosis not present

## 2018-07-23 DIAGNOSIS — W108XXA Fall (on) (from) other stairs and steps, initial encounter: Secondary | ICD-10-CM | POA: Diagnosis not present

## 2018-07-23 DIAGNOSIS — Y998 Other external cause status: Secondary | ICD-10-CM | POA: Diagnosis not present

## 2018-07-23 DIAGNOSIS — Y92019 Unspecified place in single-family (private) house as the place of occurrence of the external cause: Secondary | ICD-10-CM | POA: Insufficient documentation

## 2018-07-23 DIAGNOSIS — W109XXA Fall (on) (from) unspecified stairs and steps, initial encounter: Secondary | ICD-10-CM

## 2018-07-23 DIAGNOSIS — Y9389 Activity, other specified: Secondary | ICD-10-CM | POA: Insufficient documentation

## 2018-07-23 DIAGNOSIS — Z9049 Acquired absence of other specified parts of digestive tract: Secondary | ICD-10-CM | POA: Insufficient documentation

## 2018-07-23 DIAGNOSIS — S99912A Unspecified injury of left ankle, initial encounter: Secondary | ICD-10-CM | POA: Diagnosis present

## 2018-07-23 DIAGNOSIS — Z91040 Latex allergy status: Secondary | ICD-10-CM | POA: Diagnosis not present

## 2018-07-23 DIAGNOSIS — S82409A Unspecified fracture of shaft of unspecified fibula, initial encounter for closed fracture: Secondary | ICD-10-CM

## 2018-07-23 DIAGNOSIS — Z79899 Other long term (current) drug therapy: Secondary | ICD-10-CM | POA: Insufficient documentation

## 2018-07-23 HISTORY — DX: Unspecified fracture of shaft of unspecified fibula, initial encounter for closed fracture: S82.409A

## 2018-07-23 MED ORDER — ONDANSETRON 4 MG PO TBDP
4.0000 mg | ORAL_TABLET | Freq: Once | ORAL | Status: DC | PRN
  Filled 2018-07-23: qty 1

## 2018-07-23 MED ORDER — TRAMADOL HCL 50 MG PO TABS: 50.0000 mg | ORAL_TABLET | Freq: Four times a day (QID) | ORAL | 0 refills | Status: DC | PRN

## 2018-07-23 MED ORDER — ONDANSETRON 8 MG PO TBDP
8.0000 mg | ORAL_TABLET | Freq: Once | ORAL | Status: AC
  Administered 2018-07-23: 8 mg via ORAL
  Filled 2018-07-23: qty 1

## 2018-07-23 MED ORDER — TRAMADOL HCL 50 MG PO TABS
50.0000 mg | ORAL_TABLET | Freq: Once | ORAL | Status: AC
  Administered 2018-07-23: 50 mg via ORAL
  Filled 2018-07-23: qty 1

## 2018-07-23 NOTE — ED Notes (Signed)
Patient transported to X-ray 

## 2018-07-23 NOTE — ED Triage Notes (Signed)
Pt states she was walking and fell walking down her landing on her steps. Pt states she did not hit her head. Did not LOC. Just c/o of left ankle pain

## 2018-07-23 NOTE — ED Provider Notes (Addendum)
[facility] DE[facility] Provider Note: Georgena Spurling, MD, FACEP  CSN: [id number] MRN: [id number] ARRIVAL: 07/23/18 at [place]: [place]  Ankle Injury   HISTORY OF PRESENT ILLNESS  07/23/18 10:40 PM Alexandra Cabrera is a 52 y.o. female who slipped down the stairs at her house about an hour prior to arrival.  She is having pain in her left ankle.  She rates her pain as an 8 out of 10, worse with movement or attempted weightbearing.  She denies other injury.  She declines pain medication but would like something for nausea.   Past Medical History:  Diagnosis Date  . Acne 11/01/2015  . Allergy   . Arthritis   . Asthma    childhood and rare as adult  . Blood transfusion without reported diagnosis   . Chronic kidney disease   . Family history of adverse reaction to anesthesia    " my sisiter has had Malignant hypertheria twice, I have never had that problem"; (patient had sevo and succinylcholine with no apparent complication on 40/97/35 at [facility])  . GERD (gastroesophageal reflux disease)   . Hyperlipidemia, mixed 05/17/2016   Patient denies  . IBS (irritable bowel syndrome) 01/03/2015  . Kidney stones   . Migraines   . PONV (postoperative nausea and vomiting)    "difficult to wake up."  . Preventative health care 01/03/2015  . Rash and nonspecific skin eruption 11/01/2015    Past Surgical History:  Procedure Laterality Date  . ABDOMINAL HYSTERECTOMY    . APPENDECTOMY    . CHOLECYSTECTOMY N/A 10/12/2014   Procedure: LAPAROSCOPIC CHOLECYSTECTOMY WITH INTRAOPERATIVE CHOLANGIOGRAM;  Surgeon: Doreen Salvage, MD;  Location: [facility];  Service: General;  Laterality: N/A;  . COLONOSCOPY    . HEMORROIDECTOMY     young adult  . PARTIAL PROCTECTOMY BY TEM N/A 05/29/2018   Procedure: PARTIAL PROCTECTOMY BY TEM EXCISION OF ANAL TAG;  Surgeon: Michael Boston, MD;  Location: [facility];  Service: General;  Laterality: N/A;  . TOTAL KNEE ARTHROPLASTY     8 knee  replacements, 28 surgeries on right knee    Family History  Problem Relation Age of Onset  . Diabetes Mother   . Heart disease Mother        MI  . Arthritis Father   . COPD Father        smoker  . Hypertension Father   . Hyperlipidemia Father   . Arthritis Sister   . Arthritis Brother   . Cancer Maternal Grandmother 45       ovarian  . Arthritis Paternal Grandmother   . Heart disease Paternal Grandfather        MI  . Colon cancer Neg Hx   . Esophageal cancer Neg Hx   . Liver cancer Neg Hx   . Pancreatic cancer Neg Hx   . Rectal cancer Neg Hx   . Stomach cancer Neg Hx     Social History   Tobacco Use  . Smoking status: Never Smoker  . Smokeless tobacco: Never Used  Substance Use Topics  . Alcohol use: No  . Drug use: No    Prior to Admission medications   Medication Sig Start Date End Date Taking? Authorizing Provider  AMBULATORY NON FORMULARY MEDICATION Place 1 application rectally 4 (four) times daily. DILTIAZEM 2% compounded suspension.  (Get Rx filled at a compounding pharmacy, such as  [facility] [phone] or  [facility] [phone] in [place], [place])  05/29/18   Michael Boston, MD  amoxicillin (AMOXIL) 500 MG capsule Take 2,000 mg by mouth See admin instructions. Take 2000 mg by mouth 1 hour prior to dental appointment 02/20/18   [provider]  cetirizine (ZYRTEC) 10 MG tablet Take 1 tablet (10 mg total) by mouth daily. Patient taking differently: Take 10 mg by mouth daily as needed for allergies.  12/14/17   Mosie Lukes, MD  estradiol (ESTRACE) 2 MG tablet Take 1 tablet (2 mg total) by mouth daily. 05/28/18   Mosie Lukes, MD  gabapentin (NEURONTIN) 300 MG capsule Take 1 capsule (300 mg total) by mouth 2 (two) times daily. Increase to 4x/day as needed 05/29/18   Michael Boston, MD  ibuprofen (ADVIL,MOTRIN) 800 MG tablet TAKE 1 TABLET BY MOUTH AT ONSET OF MIGRAINE MAY REPEAT IN 6 HOURS AS NEEDED Patient taking differently:  Take 800 mg by mouth every 6 (six) hours as needed for headache or moderate pain.  12/14/17   Mosie Lukes, MD  montelukast (SINGULAIR) 10 MG tablet Take 1 tablet (10 mg total) by mouth at bedtime. Patient taking differently: Take 10 mg by mouth daily as needed (for allergies).  01/07/18   Shelda Pal, DO  Multiple Vitamin (MULTIVITAMIN WITH MINERALS) TABS tablet Take 1 tablet by mouth daily.    [provider]  ondansetron (ZOFRAN) 4 MG tablet TAKE 1 TABLET (4 MG TOTAL) BY MOUTH EVERY 8 HOURS AS NEEDED FOR NAUSEA 06/25/18   Mosie Lukes, MD  oxyCODONE (OXY IR/ROXICODONE) 5 MG immediate release tablet Take 1-2 tablets (5-10 mg total) by mouth every 6 (six) hours as needed for moderate pain, severe pain or breakthrough pain. 05/29/18   Michael Boston, MD  rizatriptan (MAXALT) 10 MG tablet Take 1 tablet (10 mg total) by mouth as needed for migraine. May repeat in 2 hours if needed 12/14/17   Mosie Lukes, MD  SUMAtriptan (IMITREX) 50 MG tablet TAKE 1 TABLET BY MOUTH AT HEADACHE ONSET. MAY REPEAT IN 2 HOURS ONE TIME ONLY Patient taking differently: Take 50 mg by mouth every 2 (two) hours as needed for migraine or headache. MAY REPEAT IN 2 HOURS ONE TIME ONLY 12/14/17   Mosie Lukes, MD  topiramate (TOPAMAX) 100 MG tablet Take 1 tablet (100 mg total) by mouth 2 (two) times daily. 05/28/18   Mosie Lukes, MD    Allergies Latex and Reglan [metoclopramide]   REVIEW OF SYSTEMS  Negative except as noted here or in the History of Present Illness.   PHYSICAL EXAMINATION  Initial Vital Signs Blood pressure 136/85, pulse 87, temperature 98.2 F (36.8 C), temperature source Oral, resp. rate 18, height 4' 11.5" (1.511 m), weight 56.7 kg, SpO2 100 %.  Examination General: Well-developed, well-nourished female in no acute distress; appearance consistent with age of record HENT: normocephalic; atraumatic Eyes: pupils equal, round and reactive to light; extraocular muscles  intact Neck: supple; nontender Heart: regular rate and rhythm Lungs: clear to auscultation bilaterally Abdomen: soft; nondistended; nontender; bowel sounds present Extremities: No deformity; full range of motion except left ankle due to pain; tenderness of left ankle with pain on attempted range of motion; pulses normal; left Achilles tendon intact; left foot distally neurovascularly intact with intact tendon function Neurologic: Awake, alert and oriented; motor function intact in all extremities and symmetric; no facial droop Skin: Warm and dry Psychiatric: Normal mood and affect   RESULTS  Summary of this visit's results, reviewed by myself:   EKG Interpretation  Date/Time:    Ventricular Rate:    PR Interval:    QRS Duration:   QT Interval:    QTC Calculation:   R Axis:     Text Interpretation:        Laboratory Studies: No results found for this or any previous visit (from the past 24 hour(s)). Imaging Studies: Dg Tibia/fibula Left  Result Date: 07/23/2018 CLINICAL DATA:  Recent fall with ankle pain, initial encounter EXAM: LEFT TIBIA AND FIBULA - 2 VIEW COMPARISON:  None. FINDINGS: Oblique fracture through the distal fibula is again noted. No other focal fractures are seen. No soft tissue changes are noted. IMPRESSION: Distal fibular fracture. Electronically Signed   By: Inez Catalina M.D.   On: 07/23/2018 23:00   Dg Ankle Complete Left  Result Date: 07/23/2018 CLINICAL DATA:  Fall downstairs with left ankle pain, initial encounter EXAM: LEFT ANKLE COMPLETE - 3+ VIEW COMPARISON:  None. FINDINGS: Oblique fracture through the distal fibular metaphysis is noted with only mild displacement. Mild soft tissue swelling is seen. No tibial fracture is noted. No other focal abnormality is noted. IMPRESSION: Distal fibular fracture Electronically Signed   By: Inez Catalina M.D.   On: 07/23/2018 22:59    ED COURSE and MDM  Nursing notes and initial vitals signs, including pulse  oximetry, reviewed.  Vitals:   07/23/18 2229  BP: 136/85  Pulse: 87  Resp: 18  Temp: 98.2 F (36.8 C)  TempSrc: Oral  SpO2: 100%  Weight: 56.7 kg  Height: 4' 11.5" (1.511 m)   Will place in cam walker and refer to orthopedics.  Consultation with the [facility] state controlled substances database reveals the patient has received no opioid prescriptions in the past 2 years.  PROCEDURES    ED DIAGNOSES     ICD-10-CM   1. Other closed fracture of distal end of left fibula, initial encounter S82.832A   2. Fall on stairs, initial encounter W10.Warnell Forester, MD 07/23/18 2308    Shanon Rosser, MD 07/23/18 2314

## 2018-07-25 MED FILL — traMADol HCL 50 MG TABS: 50 | 5 days supply | Qty: 20 | Fill #0

## 2018-07-26 DIAGNOSIS — M25572 Pain in left ankle and joints of left foot: Secondary | ICD-10-CM | POA: Diagnosis not present

## 2018-07-29 ENCOUNTER — Encounter (HOSPITAL_BASED_OUTPATIENT_CLINIC_OR_DEPARTMENT_OTHER): Payer: Self-pay

## 2018-07-29 ENCOUNTER — Other Ambulatory Visit: Payer: Self-pay

## 2018-07-29 DIAGNOSIS — M25572 Pain in left ankle and joints of left foot: Secondary | ICD-10-CM | POA: Diagnosis not present

## 2018-07-29 NOTE — Anesthesia Preprocedure Evaluation (Addendum)
Anesthesia Evaluation  Patient identified by MRN, date of birth, ID band Patient awake    Reviewed: Allergy & Precautions, H&P , NPO status , Patient's Chart, lab work & pertinent test results  History of Anesthesia Complications (+) PONV and Family history of anesthesia reaction  Airway Mallampati: II  TM Distance: >3 FB Neck ROM: Full    Dental no notable dental hx. (+) Teeth Intact, Dental Advisory Given   Pulmonary asthma ,    Pulmonary exam normal breath sounds clear to auscultation       Cardiovascular Exercise Tolerance: Good negative cardio ROS Normal cardiovascular exam Rhythm:Regular Rate:Normal     Neuro/Psych  Headaches,    GI/Hepatic Neg liver ROS,   Endo/Other  negative endocrine ROS  Renal/GU negative Renal ROS     Musculoskeletal  (+) Arthritis ,   Abdominal   Peds  Hematology negative hematology ROS (+)   Anesthesia Other Findings   Reproductive/Obstetrics                            Lab Results  Component Value Date   WBC 8.0 07/30/2018   HGB 14.1 07/30/2018   HCT 42.8 07/30/2018   MCV 97.7 07/30/2018   PLT 313 07/30/2018    Anesthesia Physical Anesthesia Plan  ASA: II  Anesthesia Plan: MAC and Regional   Post-op Pain Management:  Regional for Post-op pain   Induction: Intravenous  PONV Risk Score and Plan: 3 and TIVA, Ondansetron and Treatment may vary due to age or medical condition  Airway Management Planned: Natural Airway and Nasal Cannula  Additional Equipment:   Intra-op Plan:   Post-operative Plan:   Informed Consent: I have reviewed the patients History and Physical, chart, labs and discussed the procedure including the risks, benefits and alternatives for the proposed anesthesia with the patient or authorized representative who has indicated his/her understanding and acceptance.   Dental advisory given  Plan Discussed with:  CRNA  Anesthesia Plan Comments: (Popliteal + or minus Adductor canal block with TIVA)      Anesthesia Quick Evaluation

## 2018-07-30 ENCOUNTER — Encounter (HOSPITAL_COMMUNITY)
Admission: RE | Disposition: A | Discharge status: Home / Self Care | Payer: Self-pay | Source: Home / Self Care | Attending: Orthopedic Surgery

## 2018-07-30 ENCOUNTER — Ambulatory Visit (HOSPITAL_BASED_OUTPATIENT_CLINIC_OR_DEPARTMENT_OTHER): Payer: 59 | Admitting: Anesthesiology

## 2018-07-30 ENCOUNTER — Encounter (HOSPITAL_COMMUNITY): Payer: Self-pay | Admitting: Anesthesiology

## 2018-07-30 ENCOUNTER — Ambulatory Visit (HOSPITAL_BASED_OUTPATIENT_CLINIC_OR_DEPARTMENT_OTHER)
Admission: RE | Admit: 2018-07-30 | Discharge: 2018-07-30 | Disposition: A | Discharge status: Home / Self Care | Payer: 59 | Attending: Orthopedic Surgery | Admitting: Orthopedic Surgery

## 2018-07-30 DIAGNOSIS — W109XXA Fall (on) (from) unspecified stairs and steps, initial encounter: Secondary | ICD-10-CM | POA: Insufficient documentation

## 2018-07-30 DIAGNOSIS — Z8249 Family history of ischemic heart disease and other diseases of the circulatory system: Secondary | ICD-10-CM | POA: Diagnosis not present

## 2018-07-30 DIAGNOSIS — Z79899 Other long term (current) drug therapy: Secondary | ICD-10-CM | POA: Insufficient documentation

## 2018-07-30 DIAGNOSIS — Z8261 Family history of arthritis: Secondary | ICD-10-CM | POA: Insufficient documentation

## 2018-07-30 DIAGNOSIS — Z888 Allergy status to other drugs, medicaments and biological substances status: Secondary | ICD-10-CM | POA: Insufficient documentation

## 2018-07-30 DIAGNOSIS — Z8041 Family history of malignant neoplasm of ovary: Secondary | ICD-10-CM | POA: Insufficient documentation

## 2018-07-30 DIAGNOSIS — Z90710 Acquired absence of both cervix and uterus: Secondary | ICD-10-CM | POA: Diagnosis not present

## 2018-07-30 DIAGNOSIS — M1990 Unspecified osteoarthritis, unspecified site: Secondary | ICD-10-CM | POA: Diagnosis not present

## 2018-07-30 DIAGNOSIS — Z825 Family history of asthma and other chronic lower respiratory diseases: Secondary | ICD-10-CM | POA: Insufficient documentation

## 2018-07-30 DIAGNOSIS — Z9049 Acquired absence of other specified parts of digestive tract: Secondary | ICD-10-CM | POA: Diagnosis not present

## 2018-07-30 DIAGNOSIS — Z86010 Personal history of colonic polyps: Secondary | ICD-10-CM | POA: Diagnosis not present

## 2018-07-30 DIAGNOSIS — Z96659 Presence of unspecified artificial knee joint: Secondary | ICD-10-CM | POA: Insufficient documentation

## 2018-07-30 DIAGNOSIS — K219 Gastro-esophageal reflux disease without esophagitis: Secondary | ICD-10-CM | POA: Diagnosis not present

## 2018-07-30 DIAGNOSIS — S82892A Other fracture of left lower leg, initial encounter for closed fracture: Secondary | ICD-10-CM

## 2018-07-30 DIAGNOSIS — S82832A Other fracture of upper and lower end of left fibula, initial encounter for closed fracture: Secondary | ICD-10-CM | POA: Insufficient documentation

## 2018-07-30 DIAGNOSIS — K589 Irritable bowel syndrome without diarrhea: Secondary | ICD-10-CM | POA: Insufficient documentation

## 2018-07-30 DIAGNOSIS — Z91040 Latex allergy status: Secondary | ICD-10-CM | POA: Diagnosis not present

## 2018-07-30 DIAGNOSIS — E782 Mixed hyperlipidemia: Secondary | ICD-10-CM | POA: Insufficient documentation

## 2018-07-30 DIAGNOSIS — S82842A Displaced bimalleolar fracture of left lower leg, initial encounter for closed fracture: Secondary | ICD-10-CM | POA: Diagnosis not present

## 2018-07-30 DIAGNOSIS — R51 Headache: Secondary | ICD-10-CM | POA: Insufficient documentation

## 2018-07-30 DIAGNOSIS — S93432A Sprain of tibiofibular ligament of left ankle, initial encounter: Secondary | ICD-10-CM | POA: Diagnosis not present

## 2018-07-30 DIAGNOSIS — Z87442 Personal history of urinary calculi: Secondary | ICD-10-CM | POA: Diagnosis not present

## 2018-07-30 DIAGNOSIS — Z833 Family history of diabetes mellitus: Secondary | ICD-10-CM | POA: Insufficient documentation

## 2018-07-30 HISTORY — DX: Pelvic and perineal pain: R10.2

## 2018-07-30 HISTORY — DX: Personal history of urinary calculi: Z87.442

## 2018-07-30 HISTORY — DX: Unspecified dyspareunia: N94.10

## 2018-07-30 HISTORY — PX: ORIF ANKLE FRACTURE: SHX5408

## 2018-07-30 HISTORY — DX: Personal history of colonic polyps: Z86.010

## 2018-07-30 HISTORY — DX: Interstitial cystitis (chronic) without hematuria: N30.10

## 2018-07-30 HISTORY — DX: Other seasonal allergic rhinitis: J30.2

## 2018-07-30 HISTORY — DX: Personal history of other diseases of the digestive system: Z87.19

## 2018-07-30 HISTORY — DX: Unspecified fracture of shaft of unspecified fibula, initial encounter for closed fracture: S82.409A

## 2018-07-30 HISTORY — DX: Unspecified hemorrhoids: K64.9

## 2018-07-30 HISTORY — DX: Diverticulosis of intestine, part unspecified, without perforation or abscess without bleeding: K57.90

## 2018-07-30 LAB — CBC
HCT: 42.8 % (ref 36.0–46.0)
Hemoglobin: 14.1 g/dL (ref 12.0–15.0)
MCH: 32.2 pg (ref 26.0–34.0)
MCHC: 32.9 g/dL (ref 30.0–36.0)
MCV: 97.7 fL (ref 80.0–100.0)
NRBC: 0 % (ref 0.0–0.2)
PLATELETS: 313 10*3/uL (ref 150–400)
RBC: 4.38 MIL/uL (ref 3.87–5.11)
RDW: 13.2 % (ref 11.5–15.5)
WBC: 8 10*3/uL (ref 4.0–10.5)

## 2018-07-30 SURGERY — OPEN REDUCTION INTERNAL FIXATION (ORIF) ANKLE FRACTURE
Anesthesia: Monitor Anesthesia Care | Site: Ankle | Laterality: Left

## 2018-07-30 MED ORDER — LACTATED RINGERS IV SOLN
INTRAVENOUS | Status: DC
  Administered 2018-07-30: 08:00:00 via INTRAVENOUS

## 2018-07-30 MED ORDER — ROPIVACAINE HCL 5 MG/ML IJ SOLN
INTRAMUSCULAR | Status: DC | PRN
  Administered 2018-07-30: 25 mL via PERINEURAL
  Administered 2018-07-30: 10 mL via PERINEURAL

## 2018-07-30 MED ORDER — ACETAMINOPHEN 500 MG PO TABS
1000.0000 mg | ORAL_TABLET | Freq: Once | ORAL | Status: AC
  Administered 2018-07-30: 1000 mg via ORAL
  Filled 2018-07-30: qty 2

## 2018-07-30 MED ORDER — HYDROCODONE-ACETAMINOPHEN 7.5-325 MG PO TABS: 1.0000 | ORAL_TABLET | Freq: Once | ORAL | Status: DC | PRN

## 2018-07-30 MED ORDER — GABAPENTIN 300 MG PO CAPS: 300.0000 mg | ORAL_CAPSULE | Freq: Once | ORAL | Status: DC

## 2018-07-30 MED ORDER — FENTANYL CITRATE (PF) 100 MCG/2ML IJ SOLN
INTRAMUSCULAR | Status: AC
  Filled 2018-07-30: qty 2

## 2018-07-30 MED ORDER — LIDOCAINE 2% (20 MG/ML) 5 ML SYRINGE
INTRAMUSCULAR | Status: DC | PRN
  Administered 2018-07-30: 50 mg via INTRAVENOUS

## 2018-07-30 MED ORDER — MIDAZOLAM HCL 2 MG/2ML IJ SOLN
INTRAMUSCULAR | Status: AC
  Administered 2018-07-30: 2 mg via INTRAVENOUS
  Filled 2018-07-30: qty 2

## 2018-07-30 MED ORDER — ASPIRIN EC 81 MG PO TBEC: 81.0000 mg | DELAYED_RELEASE_TABLET | Freq: Two times a day (BID) | ORAL | 0 refills | Status: DC

## 2018-07-30 MED ORDER — PROMETHAZINE HCL 25 MG/ML IJ SOLN: 6.2500 mg | INTRAMUSCULAR | Status: DC | PRN

## 2018-07-30 MED ORDER — ONDANSETRON HCL 4 MG/2ML IJ SOLN
INTRAMUSCULAR | Status: DC | PRN
  Administered 2018-07-30: 4 mg via INTRAVENOUS

## 2018-07-30 MED ORDER — ACETAMINOPHEN 500 MG PO TABS: 1000.0000 mg | ORAL_TABLET | Freq: Three times a day (TID) | ORAL | 0 refills | Status: AC

## 2018-07-30 MED ORDER — BUPIVACAINE HCL (PF) 0.5 % IJ SOLN
INTRAMUSCULAR | Status: AC
  Filled 2018-07-30: qty 30

## 2018-07-30 MED ORDER — ACETAMINOPHEN 500 MG PO TABS: 1000.0000 mg | ORAL_TABLET | Freq: Once | ORAL | Status: DC

## 2018-07-30 MED ORDER — ARTIFICIAL TEARS OPHTHALMIC OINT
TOPICAL_OINTMENT | OPHTHALMIC | Status: AC
  Filled 2018-07-30: qty 3.5

## 2018-07-30 MED ORDER — MIDAZOLAM HCL 2 MG/2ML IJ SOLN
1.0000 mg | Freq: Once | INTRAMUSCULAR | Status: AC | PRN
  Administered 2018-07-30: 2 mg via INTRAVENOUS

## 2018-07-30 MED ORDER — LIDOCAINE 2% (20 MG/ML) 5 ML SYRINGE
INTRAMUSCULAR | Status: AC
  Filled 2018-07-30: qty 5

## 2018-07-30 MED ORDER — MEPERIDINE HCL 50 MG/ML IJ SOLN: 6.2500 mg | INTRAMUSCULAR | Status: DC | PRN

## 2018-07-30 MED ORDER — CHLORHEXIDINE GLUCONATE 4 % EX LIQD: 60.0000 mL | Freq: Once | CUTANEOUS | Status: DC

## 2018-07-30 MED ORDER — GABAPENTIN 300 MG PO CAPS
300.0000 mg | ORAL_CAPSULE | Freq: Once | ORAL | Status: AC
  Administered 2018-07-30: 300 mg via ORAL
  Filled 2018-07-30: qty 1

## 2018-07-30 MED ORDER — CEFAZOLIN SODIUM-DEXTROSE 2-4 GM/100ML-% IV SOLN
2.0000 g | INTRAVENOUS | Status: AC
  Administered 2018-07-30: 2 g via INTRAVENOUS
  Filled 2018-07-30: qty 100

## 2018-07-30 MED ORDER — 0.9 % SODIUM CHLORIDE (POUR BTL) OPTIME
TOPICAL | Status: DC | PRN
  Administered 2018-07-30: 1000 mL

## 2018-07-30 MED ORDER — GABAPENTIN 300 MG PO CAPS: 300.0000 mg | ORAL_CAPSULE | Freq: Two times a day (BID) | ORAL | 0 refills | Status: DC

## 2018-07-30 MED ORDER — DEXAMETHASONE SODIUM PHOSPHATE 10 MG/ML IJ SOLN
INTRAMUSCULAR | Status: AC
  Filled 2018-07-30: qty 1

## 2018-07-30 MED ORDER — FENTANYL CITRATE (PF) 100 MCG/2ML IJ SOLN
50.0000 ug | Freq: Once | INTRAMUSCULAR | Status: AC | PRN
  Administered 2018-07-30: 50 ug via INTRAVENOUS

## 2018-07-30 MED ORDER — FENTANYL CITRATE (PF) 100 MCG/2ML IJ SOLN
INTRAMUSCULAR | Status: DC | PRN
  Administered 2018-07-30: 25 ug via INTRAVENOUS

## 2018-07-30 MED ORDER — MIDAZOLAM HCL 2 MG/2ML IJ SOLN
INTRAMUSCULAR | Status: DC | PRN
  Administered 2018-07-30: 1 mg via INTRAVENOUS

## 2018-07-30 MED ORDER — OXYCODONE HCL 5 MG PO TABS: 5.0000 mg | ORAL_TABLET | ORAL | 0 refills | Status: AC | PRN

## 2018-07-30 MED ORDER — MIDAZOLAM HCL 2 MG/2ML IJ SOLN
INTRAMUSCULAR | Status: AC
  Filled 2018-07-30: qty 2

## 2018-07-30 MED ORDER — DOCUSATE SODIUM 100 MG PO CAPS: 100.0000 mg | ORAL_CAPSULE | Freq: Two times a day (BID) | ORAL | 0 refills | Status: DC

## 2018-07-30 MED ORDER — BUPIVACAINE HCL (PF) 0.5 % IJ SOLN
INTRAMUSCULAR | Status: DC | PRN
  Administered 2018-07-30: 30 mL

## 2018-07-30 MED ORDER — FENTANYL CITRATE (PF) 100 MCG/2ML IJ SOLN
INTRAMUSCULAR | Status: AC
  Administered 2018-07-30: 50 ug via INTRAVENOUS
  Filled 2018-07-30: qty 2

## 2018-07-30 MED ORDER — PROPOFOL 500 MG/50ML IV EMUL
INTRAVENOUS | Status: DC | PRN
  Administered 2018-07-30: 200 ug/kg/min via INTRAVENOUS

## 2018-07-30 MED ORDER — ONDANSETRON HCL 4 MG/2ML IJ SOLN
INTRAMUSCULAR | Status: AC
  Filled 2018-07-30: qty 2

## 2018-07-30 MED ORDER — PROPOFOL 10 MG/ML IV BOLUS
INTRAVENOUS | Status: AC
  Filled 2018-07-30: qty 20

## 2018-07-30 MED ORDER — DEXAMETHASONE SODIUM PHOSPHATE 4 MG/ML IJ SOLN
INTRAMUSCULAR | Status: DC | PRN
  Administered 2018-07-30: 5 mg via INTRAVENOUS

## 2018-07-30 MED ORDER — KETOROLAC TROMETHAMINE 30 MG/ML IJ SOLN: 30.0000 mg | Freq: Once | INTRAMUSCULAR | Status: DC | PRN

## 2018-07-30 MED ORDER — PROPOFOL 500 MG/50ML IV EMUL
INTRAVENOUS | Status: AC
  Filled 2018-07-30: qty 150

## 2018-07-30 MED ORDER — FENTANYL CITRATE (PF) 100 MCG/2ML IJ SOLN: 25.0000 ug | INTRAMUSCULAR | Status: DC | PRN

## 2018-07-30 MED ORDER — METHOCARBAMOL 500 MG PO TABS: 500.0000 mg | ORAL_TABLET | Freq: Three times a day (TID) | ORAL | 0 refills | Status: DC | PRN

## 2018-07-30 MED FILL — GABAPENTIN 300 MG CAPSULE: 300 | 14 days supply | Qty: 28 | Fill #0

## 2018-07-30 MED FILL — oxyCODONE HCL 5 MG TABS: 5 | 6 days supply | Qty: 40 | Fill #0

## 2018-07-30 MED FILL — METHOCARBAMOL 500 MG TABLET: 500 | 6 days supply | Qty: 20 | Fill #0

## 2018-07-30 MED FILL — DOK 100 MG SOFTGEL: 100 | 50 days supply | Qty: 100 | Fill #0

## 2018-07-30 MED FILL — ACETAMINOPHEN 500 MG TABS: 500 | 17 days supply | Qty: 100 | Fill #0

## 2018-07-30 MED FILL — ASPIRIN ADULT LOW STRENGTH: 81 | 30 days supply | Qty: 60 | Fill #0

## 2018-07-30 SURGICAL SUPPLY — 71 items
ANKLE SYNDEMOSIS ZIPTIGHT (Ankle) ×2 IMPLANT
BANDAGE ACE 4X5 VEL STRL LF (GAUZE/BANDAGES/DRESSINGS) ×2 IMPLANT
BANDAGE ACE 6X5 VEL STRL LF (GAUZE/BANDAGES/DRESSINGS) ×2 IMPLANT
BANDAGE ESMARK 6X9 LF (GAUZE/BANDAGES/DRESSINGS) ×1 IMPLANT
BIT DRILL 110X2.5XQCK CNCT (BIT) ×1 IMPLANT
BIT DRILL 2.5 (BIT) ×1
BIT DRILL QC 110 3.5 (BIT) ×1
BIT DRILL QC 110 3.5MM (BIT) ×1 IMPLANT
BIT DRL 110X2.5XQCK CNCT (BIT) ×1
BLADE SURG 15 STRL LF DISP TIS (BLADE) ×1 IMPLANT
BLADE SURG 15 STRL SS (BLADE) ×1
BNDG COHESIVE 4X5 TAN STRL (GAUZE/BANDAGES/DRESSINGS) ×2 IMPLANT
BNDG ESMARK 6X9 LF (GAUZE/BANDAGES/DRESSINGS) ×2
COVER BACK TABLE 60X90IN (DRAPES) ×2 IMPLANT
COVER MAYO STAND STRL (DRAPES) ×2 IMPLANT
COVER WAND RF STERILE (DRAPES) ×2 IMPLANT
CUFF TOURNIQUET SINGLE 24IN (TOURNIQUET CUFF) IMPLANT
DECANTER SPIKE VIAL GLASS SM (MISCELLANEOUS) ×2 IMPLANT
DRAPE EXTREMITY T 121X128X90 (DRAPE) ×2 IMPLANT
DRAPE IMP U-DRAPE 54X76 (DRAPES) ×2 IMPLANT
DRAPE OEC MINIVIEW 54X84 (DRAPES) ×2 IMPLANT
DRAPE U-SHAPE 47X51 STRL (DRAPES) ×2 IMPLANT
DRILL BIT QC 110 3.5MM (BIT) ×1
DRSG EMULSION OIL 3X3 NADH (GAUZE/BANDAGES/DRESSINGS) ×2 IMPLANT
DRSG PAD ABDOMINAL 8X10 ST (GAUZE/BANDAGES/DRESSINGS) ×6 IMPLANT
DURAPREP 26ML APPLICATOR (WOUND CARE) ×2 IMPLANT
ELECT REM PT RETURN 9FT ADLT (ELECTROSURGICAL) ×2
ELECTRODE REM PT RTRN 9FT ADLT (ELECTROSURGICAL) ×1 IMPLANT
GAUZE SPONGE 4X4 12PLY STRL (GAUZE/BANDAGES/DRESSINGS) ×2 IMPLANT
GLOVE BIO SURGEON STRL SZ7.5 (GLOVE) IMPLANT
GLOVE BIOGEL PI IND STRL 8 (GLOVE) ×2 IMPLANT
GLOVE BIOGEL PI INDICATOR 8 (GLOVE) ×2
GLOVE SURG SS PI 7.5 STRL IVOR (GLOVE) ×4 IMPLANT
GOWN STRL REUS W/ TWL LRG LVL3 (GOWN DISPOSABLE) ×1 IMPLANT
GOWN STRL REUS W/ TWL XL LVL3 (GOWN DISPOSABLE) ×1 IMPLANT
GOWN STRL REUS W/TWL LRG LVL3 (GOWN DISPOSABLE) ×1
GOWN STRL REUS W/TWL XL LVL3 (GOWN DISPOSABLE) ×1
NEEDLE HYPO 22GX1.5 SAFETY (NEEDLE) ×2 IMPLANT
NS IRRIG 1000ML POUR BTL (IV SOLUTION) ×2 IMPLANT
PACK BASIN DAY SURGERY FS (CUSTOM PROCEDURE TRAY) ×2 IMPLANT
PAD CAST 4YDX4 CTTN HI CHSV (CAST SUPPLIES) ×2 IMPLANT
PADDING CAST COTTON 4X4 STRL (CAST SUPPLIES) ×2
PADDING CAST COTTON 6X4 STRL (CAST SUPPLIES) ×2 IMPLANT
PENCIL BUTTON HOLSTER BLD 10FT (ELECTRODE) ×2 IMPLANT
PLATE 6HOLE 1/3 TUBULAR (Plate) ×2 IMPLANT
SCREW CANC 2.5XFT 16X4XST SM (Screw) ×2 IMPLANT
SCREW CANC 4.0X16 (Screw) ×2 IMPLANT
SCREW CANCELLOUS FT 4.0X14 (Screw) ×2 IMPLANT
SCREW CORTICAL 3.5X12 (Screw) ×6 IMPLANT
SCREW CORTICAL 3.5X14 (Screw) ×2 IMPLANT
SPLINT FAST PLASTER 5X30 (CAST SUPPLIES) ×20
SPLINT PLASTER CAST FAST 5X30 (CAST SUPPLIES) ×20 IMPLANT
SPONGE LAP 4X18 RFD (DISPOSABLE) ×2 IMPLANT
SUCTION FRAZIER HANDLE 10FR (MISCELLANEOUS) ×1
SUCTION TUBE FRAZIER 10FR DISP (MISCELLANEOUS) ×1 IMPLANT
SUT ETHILON 3 0 PS 1 (SUTURE) IMPLANT
SUT MNCRL AB 4-0 PS2 18 (SUTURE) IMPLANT
SUT MON AB 2-0 CT1 36 (SUTURE) IMPLANT
SUT MON AB 2-0 SH 27 (SUTURE) ×2
SUT MON AB 2-0 SH27 (SUTURE) ×2 IMPLANT
SUT MON AB 3-0 SH 27 (SUTURE)
SUT MON AB 3-0 SH27 (SUTURE) IMPLANT
SUT VIC AB 0 SH 27 (SUTURE) ×2 IMPLANT
SUT VIC AB 2-0 SH 27 (SUTURE)
SUT VIC AB 2-0 SH 27XBRD (SUTURE) IMPLANT
SYR BULB 3OZ (MISCELLANEOUS) ×2 IMPLANT
SYR CONTROL 10ML LL (SYRINGE) ×2 IMPLANT
SYSTEM FIXATN ANKL SYNDESMOSIS (Ankle) ×1 IMPLANT
TOWEL OR 17X24 6PK STRL BLUE (TOWEL DISPOSABLE) ×4 IMPLANT
TUBE CONNECTING 12X1/4 (SUCTIONS) ×2 IMPLANT
UNDERPAD 30X30 (UNDERPADS AND DIAPERS) ×2 IMPLANT

## 2018-07-30 NOTE — Anesthesia Procedure Notes (Signed)
Anesthesia Regional Block: Adductor canal block   Pre-Anesthetic Checklist: ,, timeout performed, Correct Patient, Correct Site, Correct Laterality, Correct Procedure, Correct Position, site marked, Risks and benefits discussed,  Surgical consent,  Pre-op evaluation,  At surgeon's request and post-op pain management  Laterality: Left  Prep: chloraprep       Needles:  Injection technique: Single-shot  Needle Type: Echogenic Needle     Needle Length: 9cm  Needle Gauge: 22     Additional Needles:   Procedures:,,,, ultrasound used (permanent image in chart),,,,  Narrative:  Start time: 07/30/2018 8:14 AM End time: 07/30/2018 8:18 AM Injection made incrementally with aspirations every 5 mL.  Performed by: Personally  Anesthesiologist: Barnet Glasgow, MD  Additional Notes: Block assessed prior to surgery. Pt tolerated procedure well.

## 2018-07-30 NOTE — Op Note (Signed)
07/30/2018  9:58 AM  PATIENT:  Alexandra Cabrera    PRE-OPERATIVE DIAGNOSIS:  LEFT ANKLE FRACTURE  POST-OPERATIVE DIAGNOSIS:  Same  PROCEDURE:  OPEN REDUCTION INTERNAL FIXATION (ORIF) LEFT ANKLE FRACTURE  SURGEON:  Renette Butters, MD  ASSISTANT: Roxan Hockey, PA-C, he was present and scrubbed throughout the case, critical for completion in a timely fashion, and for retraction, instrumentation, and closure.   ANESTHESIA:   regional   PREOPERATIVE INDICATIONS:  Alexandra Cabrera is a  52 y.o. female with a diagnosis of LEFT ANKLE FRACTURE who failed conservative measures and elected for surgical management.    The risks benefits and alternatives were discussed with the patient preoperatively including but not limited to the risks of infection, bleeding, nerve injury, cardiopulmonary complications, the need for revision surgery, among others, and the patient was willing to proceed.  OPERATIVE IMPLANTS: biomet plate and zip tie  OPERATIVE FINDINGS: Unstable ankle fracture. Stable syndesmosis post op  BLOOD LOSS: min  COMPLICATIONS: none  TOURNIQUET TIME: 53min  OPERATIVE PROCEDURE:  Patient was identified in the preoperative holding area and site was marked by me He was transported to the operating theater and placed on the table in supine position taking care to pad all bony prominences. After a preincinduction time out anesthesia was induced. The left lower extremity was prepped and draped in normal sterile fashion and a pre-incision timeout was performed. Alexandra Cabrera received ancef for preoperative antibiotics.   I made a lateral incision of roughly 7 cm dissection was carried down sharply to the distal fibula and then spreading dissection was used proximally to protect the superficial peroneal nerve. I sharply incised the periosteum and took care to protect the peroneal tendons. I then debrided the fracture site and performed a reduction maneuver which was held in place with a  clamp.   I placed a lag screw across the fracture  I then selected a 7-hole one third tubular plate and placed in a neutralization fashion care was taken distally so as not to penetrate the joint with the cancellus screws.  I then stressed the syndesmosis and it was slightly unstable withy mild widening for syndesmotic fixation I performed a reduction maneuver and placed a zip tie across this.   The wound was then thoroughly irrigated and closed using a 0 Vicryl and absorbable Monocryl sutures. He was placed in a short leg splint.   POST OPERATIVE PLAN: Non-weightbearing. DVT prophylaxis will consist of mobilization and chemical px

## 2018-07-30 NOTE — Discharge Instructions (Signed)
Elevate leg - Toes above nose as much as possible to reduce pain / swelling. If needed, you may increase pain medication (oxycodone) for the first few days post op to 2 tablets every 4 hours.  You may loosen and re-apply ace wrap if it feels too tight.  Weight Bearing:  Non weight bearing affected leg.  Diet: As you were doing prior to hospitalization   Shower:  You have a splint on, leave the splint in place and keep the splint dry with a plastic bag.  Dressing:  You have a splint. Leave the splint in place and we will change your bandages during your first follow-up appointment.    Activity:  Increase activity slowly as tolerated, but follow the weight bearing instructions below.  The rules on driving is that you can not be taking narcotics while you drive, and you must feel in control of the vehicle.    To prevent constipation:  Narcotic medicines cause constipation.  Wean these as soon as is appropriate.   You may use a stool softener such as -  Colace (over the counter) 100 mg by mouth twice a day  Drink plenty of fluids (prune juice may be helpful) and high fiber foods Miralax (over the counter) for constipation as needed.    Itching:  If you experience itching with your medications, try taking only a single pain pill, or even half a pain pill at a time.  You can also use benadryl over the counter for itching or also to help with sleep.   Precautions:  If you experience chest pain or shortness of breath - call 911 immediately for transfer to the hospital emergency department!!  If you develop a fever greater that 101 F, purulent drainage from wound, increased redness or drainage from wound, or calf pain -- Call the office at [phone]                                                 Follow- Up Appointment:  Please call for an appointment to be seen in 2 weeks [place] - [phone]

## 2018-07-30 NOTE — Anesthesia Postprocedure Evaluation (Signed)
Anesthesia Post Note  Patient: Alexandra Cabrera  Procedure(s) Performed: OPEN REDUCTION INTERNAL FIXATION (ORIF) LEFT ANKLE FRACTURE (Left Ankle)     Patient location during evaluation: PACU Anesthesia Type: MAC and Regional Level of consciousness: awake and alert Pain management: pain level controlled Vital Signs Assessment: post-procedure vital signs reviewed and stable Respiratory status: spontaneous breathing, nonlabored ventilation, respiratory function stable and patient connected to nasal cannula oxygen Cardiovascular status: stable and blood pressure returned to baseline Postop Assessment: no apparent nausea or vomiting Anesthetic complications: no    Last Vitals:  Vitals:   07/30/18 1030 07/30/18 1051  BP: 108/78 117/72  Pulse: 64 64  Resp: 15 14  Temp: (!) 36.4 C 36.7 C  SpO2: 100% 100%    Last Pain:  Vitals:   07/30/18 1051  TempSrc:   PainSc: 0-No pain                 Barnet Glasgow

## 2018-07-30 NOTE — Anesthesia Procedure Notes (Signed)
Anesthesia Regional Block: Popliteal block   Pre-Anesthetic Checklist: ,, timeout performed, Correct Patient, Correct Site, Correct Laterality, Correct Procedure, Correct Position, site marked, Risks and benefits discussed, pre-op evaluation,  At surgeon's request and post-op pain management  Laterality: Left  Prep: Maximum Sterile Barrier Precautions used, chloraprep       Needles:  Injection technique: Single-shot  Needle Type: Echogenic Needle     Needle Length: 9cm  Needle Gauge: 21     Additional Needles:   Procedures:,,,, ultrasound used (permanent image in chart),,,,  Narrative:  Start time: 07/30/2018 8:09 AM End time: 07/30/2018 8:14 AM Injection made incrementally with aspirations every 5 mL.  Performed by: Personally  Anesthesiologist: Barnet Glasgow, MD  Additional Notes: Block assessed. Patient tolerated procedure well.

## 2018-07-30 NOTE — Progress Notes (Addendum)
AssistedDr. Valma Cava with left, ultrasound guided, popliteal, adductor canal block. Side rails up, monitors on throughout procedure. See vital signs in flow sheet. Tolerated Procedure well.  Time out performed at 0808.

## 2018-07-30 NOTE — Anesthesia Procedure Notes (Signed)
Procedure Name: MAC Date/Time: 07/30/2018 8:55 AM Performed by: Wanita Chamberlain, CRNA Pre-anesthesia Checklist: Patient identified, Emergency Drugs available, Suction available, Patient being monitored and Timeout performed Patient Re-evaluated:Patient Re-evaluated prior to induction Oxygen Delivery Method: Nasal cannula Induction Type: IV induction Placement Confirmation: CO2 detector and positive ETCO2

## 2018-07-30 NOTE — Transfer of Care (Signed)
Immediate Anesthesia Transfer of Care Note  Patient: Alexandra Cabrera  Procedure(s) Performed: OPEN REDUCTION INTERNAL FIXATION (ORIF) LEFT ANKLE FRACTURE (Left Ankle)  Patient Location: PACU  Anesthesia Type:MAC  Level of Consciousness: awake, alert , oriented and patient cooperative  Airway & Oxygen Therapy: Patient Spontanous Breathing and Patient connected to nasal cannula oxygen  Post-op Assessment: Report given to RN and Post -op Vital signs reviewed and stable  Post vital signs: Reviewed and stable  Last Vitals:  Vitals Value Taken Time  BP 88/63 07/30/2018 10:09 AM  Temp    Pulse 63 07/30/2018 10:11 AM  Resp 11 07/30/2018 10:11 AM  SpO2 100 % 07/30/2018 10:11 AM  Vitals shown include unvalidated device data.  Last Pain:  Vitals:   07/30/18 0750  TempSrc:   PainSc: 5       Patients Stated Pain Goal: 4 (59/29/24 4628)  Complications: No apparent anesthesia complications

## 2018-07-30 NOTE — H&P (Signed)
   ORTHOPAEDIC CONSULTATION  REQUESTING PHYSICIAN: Murphy, Timothy D, MD  Chief Complaint: L ankle fracture  HPI: I reviewed and agree with the below history  Alexandra Cabrera is a 52 y.o. female who slipped down the stairs at her house about an hour prior to arrival.  She is having pain in her left ankle.  She rates her pain as an 8 out of 10, worse with movement or attempted weightbearing.  She denies other injury.  She declines pain medication but would like something for nausea.  Past Medical History:  Diagnosis Date  . Acne 11/01/2015  . Allergy   . Arthritis   . Asthma    childhood and rare as adult  . Blood transfusion without reported diagnosis   . Diverticulosis 02/04/2018   Mild sigmoid, noted on Colonoscopy  . Dyspareunia, female   . Family history of adverse reaction to anesthesia    " my sisiter has had Malignant hypertheria twice, I have never had that problem"; (patient had sevo and succinylcholine with no apparent complication on 06/17/02 at [facility])  . Fibula fracture 07/23/2018   Distal fibular fracture  . GERD (gastroesophageal reflux disease)   . Hemorrhoids 02/04/2018   noted on Colonoscopy  . History of appendicitis   . History of colon polyps 02/04/2018  . History of gallstones   . History of kidney stones   . Hyperlipidemia, mixed 05/17/2016   Patient denies  . IBS (irritable bowel syndrome) 01/03/2015  . IC (interstitial cystitis)   . Migraines   . Pelvic pain   . PONV (postoperative nausea and vomiting)    "difficult to wake up."  . Preventative health care 01/03/2015  . Rash and nonspecific skin eruption 11/01/2015  . Seasonal allergies    Past Surgical History:  Procedure Laterality Date  . ABDOMINAL HYSTERECTOMY    . APPENDECTOMY    . CHOLECYSTECTOMY N/A 10/12/2014   Procedure: LAPAROSCOPIC CHOLECYSTECTOMY WITH INTRAOPERATIVE CHOLANGIOGRAM;  Surgeon: Jay Wyatt, MD;  Location: [facility];  Service: General;  Laterality: N/A;  .  COLONOSCOPY  02/04/2018  . HEMORROIDECTOMY     young adult  . PARTIAL PROCTECTOMY BY TEM N/A 05/29/2018   Procedure: PARTIAL PROCTECTOMY BY TEM EXCISION OF ANAL TAG;  Surgeon: Gross, Steven, MD;  Location: [facility];  Service: General;  Laterality: N/A;  . TOTAL KNEE ARTHROPLASTY     1 knee replacements, 7 knee revision, 28 surgeries on right knee   Social History   Socioeconomic History  . Marital status: Married    Spouse name: Not on file  . Number of children: Not on file  . Years of education: Not on file  . Highest education level: Not on file  Occupational History  . Not on file  Social Needs  . Financial resource strain: Not on file  . Food insecurity:    Worry: Not on file    Inability: Not on file  . Transportation needs:    Medical: Not on file    Non-medical: Not on file  Tobacco Use  . Smoking status: Never Smoker  . Smokeless tobacco: Never Used  Substance and Sexual Activity  . Alcohol use: No  . Drug use: No  . 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 Physical activity:    Days per week: Not on file    Minutes per session: Not on file  .   Stress: Not on file  Relationships  . Social connections:    Talks on phone: Not on file    Gets together: Not on file    Attends religious service: Not on file    Active member of club or organization: Not on file    Attends meetings of clubs or organizations: Not on file    Relationship status: Not on file  Other Topics Concern  . Not on file  Social History Narrative  . Not on file   Family History  Problem Relation Age of Onset  . Diabetes Mother   . Heart disease Mother        MI  . Arthritis Father   . COPD Father        smoker  . Hypertension Father   . Hyperlipidemia Father   . Arthritis Sister   . Arthritis Brother   . Cancer Maternal Grandmother 45       ovarian  . Arthritis Paternal  Grandmother   . Heart disease Paternal Grandfather        MI  . Colon cancer Neg Hx   . Esophageal cancer Neg Hx   . Liver cancer Neg Hx   . Pancreatic cancer Neg Hx   . Rectal cancer Neg Hx   . Stomach cancer Neg Hx    Allergies  Allergen Reactions  . Latex Anaphylaxis  . Phenergan [Promethazine Hcl] Other (See Comments)    Shaky, tremors  . Reglan [Metoclopramide] Anxiety   Prior to Admission medications   Medication Sig Start Date End Date Taking? Authorizing Provider  amoxicillin (AMOXIL) 500 MG capsule Take 2,000 mg by mouth See admin instructions. Take 2000 mg by mouth 1 hour prior to dental appointment 02/20/18   [provider]  cetirizine (ZYRTEC) 10 MG tablet Take 1 tablet (10 mg total) by mouth daily. Patient taking differently: Take 10 mg by mouth daily as needed for allergies.  12/14/17   Blyth, Stacey A, MD  estradiol (ESTRACE) 2 MG tablet Take 1 tablet (2 mg total) by mouth daily. 05/28/18   Blyth, Stacey A, MD  ibuprofen (ADVIL,MOTRIN) 800 MG tablet TAKE 1 TABLET BY MOUTH AT ONSET OF MIGRAINE MAY REPEAT IN 6 HOURS AS NEEDED Patient taking differently: Take 800 mg by mouth every 6 (six) hours as needed for headache or moderate pain.  12/14/17   Blyth, Stacey A, MD  montelukast (SINGULAIR) 10 MG tablet Take 1 tablet (10 mg total) by mouth at bedtime. Patient taking differently: Take 10 mg by mouth daily as needed (for allergies).  01/07/18   Wendling, Nicholas Paul, DO  Multiple Vitamin (MULTIVITAMIN WITH MINERALS) TABS tablet Take 1 tablet by mouth daily.    [provider]  ondansetron (ZOFRAN) 4 MG tablet TAKE 1 TABLET (4 MG TOTAL) BY MOUTH EVERY 8 HOURS AS NEEDED FOR NAUSEA 06/25/18   Blyth, Stacey A, MD  rizatriptan (MAXALT) 10 MG tablet Take 1 tablet (10 mg total) by mouth as needed for migraine. May repeat in 2 hours if needed 12/14/17   Blyth, Stacey A, MD  SUMAtriptan (IMITREX) 50 MG tablet TAKE 1 TABLET BY MOUTH AT HEADACHE ONSET. MAY REPEAT IN 2  HOURS ONE TIME ONLY Patient taking differently: Take 50 mg by mouth every 2 (two) hours as needed for migraine or headache. MAY REPEAT IN 2 HOURS ONE TIME ONLY 12/14/17   Blyth, Stacey A, MD  topiramate (TOPAMAX) 100 MG tablet Take 1 tablet (100 mg total) by mouth 2 (two) times   daily. 05/28/18   Blyth, Stacey A, MD  traMADol (ULTRAM) 50 MG tablet Take 1 tablet (50 mg total) by mouth every 6 (six) hours as needed (for pain). 07/23/18   Molpus, John, MD   No results found.  Positive ROS: All other systems have been reviewed and were otherwise negative with the exception of those mentioned in the HPI and as above.  Labs cbc No results for input(s): WBC, HGB, HCT, PLT in the last 72 hours.  Labs inflam No results for input(s): CRP in the last 72 hours.  Invalid input(s): ESR  Labs coag No results for input(s): INR, PTT in the last 72 hours.  Invalid input(s): PT  No results for input(s): NA, K, CL, CO2, GLUCOSE, BUN, CREATININE, CALCIUM in the last 72 hours.  Physical Exam: Vitals:   07/30/18 0720  BP: (!) 133/113  Pulse: 77  Resp: 16  Temp: 98.2 F (36.8 C)  SpO2: 100%   General: Alert, no acute distress Cardiovascular: No pedal edema Respiratory: No cyanosis, no use of accessory musculature GI: No organomegaly, abdomen is soft and non-tender Skin: No lesions in the area of chief complaint other than those listed below in MSK exam.  Neurologic: Sensation intact distally save for the below mentioned MSK exam Psychiatric: Patient is competent for consent with normal mood and affect Lymphatic: No axillary or cervical lymphadenopathy  MUSCULOSKELETAL:  LLE: compartments soft, NVI.  Other extremities are atraumatic with painless ROM and NVI.  Assessment: Left bimal equivalent ankle fracture  Plan: Plan for ORIF today.   Timothy D Murphy, MD Cell [phone]   07/30/2018 7:29 AM    

## 2018-08-01 ENCOUNTER — Encounter (HOSPITAL_BASED_OUTPATIENT_CLINIC_OR_DEPARTMENT_OTHER): Payer: Self-pay | Admitting: Orthopedic Surgery

## 2018-08-06 MED FILL — traMADol HCL 50 MG TABS: 50 | 7 days supply | Qty: 30 | Fill #0

## 2018-08-12 DIAGNOSIS — S82842D Displaced bimalleolar fracture of left lower leg, subsequent encounter for closed fracture with routine healing: Secondary | ICD-10-CM | POA: Diagnosis not present

## 2018-08-20 MED FILL — TOPIRAMATE 100 MG TABLET: 100 | 30 days supply | Qty: 60 | Fill #3

## 2018-08-20 MED FILL — RIZATRIPTAN BENZOATE 10 MG: 10 | 30 days supply | Qty: 9 | Fill #5

## 2018-08-20 MED FILL — SUMATRIPTAN SUCC 50 MG TAB: 50 | 30 days supply | Qty: 9 | Fill #5

## 2018-08-20 MED FILL — ESTRADIOL 2 MG TABS: 2 | 30 days supply | Qty: 30 | Fill #3

## 2018-08-20 MED FILL — IBUPROFEN 800 MG TAB: 800 | 7 days supply | Qty: 30 | Fill #2

## 2018-09-13 DIAGNOSIS — S82842D Displaced bimalleolar fracture of left lower leg, subsequent encounter for closed fracture with routine healing: Secondary | ICD-10-CM | POA: Diagnosis not present

## 2018-09-23 ENCOUNTER — Other Ambulatory Visit: Payer: Self-pay | Admitting: Family Medicine

## 2018-09-23 MED FILL — SUMATRIPTAN SUCC 50 MG TAB: 50 | 15 days supply | Qty: 9 | Fill #0

## 2018-09-23 MED FILL — ESTRADIOL 2 MG TABS: 2 | 30 days supply | Qty: 30 | Fill #4

## 2018-09-23 MED FILL — RIZATRIPTAN BENZOATE 10 MG: 10 | 20 days supply | Qty: 6 | Fill #6

## 2018-09-23 MED FILL — MONTELUKAST SOD 10 MG TAB: 10 | 30 days supply | Qty: 30 | Fill #1

## 2018-09-23 MED FILL — ONDANSETRON HCL 4 MG TABLET: 4 | 7 days supply | Qty: 20 | Fill #1

## 2018-09-23 MED FILL — TOPIRAMATE 100 MG TABLET: 100 | 30 days supply | Qty: 60 | Fill #4

## 2018-10-11 DIAGNOSIS — S82842D Displaced bimalleolar fracture of left lower leg, subsequent encounter for closed fracture with routine healing: Secondary | ICD-10-CM | POA: Diagnosis not present

## 2018-10-11 MED FILL — DICLOFENAC SODIUM 1 % GEL: 1 | 25 days supply | Qty: 100 | Fill #0

## 2018-10-23 ENCOUNTER — Other Ambulatory Visit: Payer: Self-pay | Admitting: Family Medicine

## 2018-10-23 MED FILL — MONTELUKAST SOD 10 MG TAB: 10 | 30 days supply | Qty: 30 | Fill #2

## 2018-10-23 MED FILL — ESTRADIOL 2 MG TABS: 2 | 30 days supply | Qty: 30 | Fill #5

## 2018-10-23 MED FILL — TOPIRAMATE 100 MG TABLET: 100 | 30 days supply | Qty: 60 | Fill #5

## 2018-10-23 MED FILL — SUMATRIPTAN SUCC 50 MG TAB: 50 | 15 days supply | Qty: 9 | Fill #1

## 2018-10-24 MED FILL — RIZATRIPTAN BENZOATE 10 MG: 10 | 30 days supply | Qty: 9 | Fill #0

## 2018-11-22 MED FILL — ESTRADIOL 2 MG TABS: 2 | 30 days supply | Qty: 30 | Fill #0

## 2018-11-22 MED FILL — TOPIRAMATE 100 MG TABLET: 100 | 30 days supply | Qty: 60 | Fill #0

## 2018-12-17 ENCOUNTER — Other Ambulatory Visit: Payer: Self-pay | Admitting: Family Medicine

## 2018-12-17 ENCOUNTER — Telehealth: Payer: Self-pay | Admitting: Family Medicine

## 2018-12-17 MED FILL — SUMATRIPTAN SUCC 50 MG TAB: 50 | 15 days supply | Qty: 9 | Fill #2

## 2018-12-17 MED FILL — RIZATRIPTAN BENZOATE 10 MG: 10 | 30 days supply | Qty: 9 | Fill #1

## 2018-12-17 MED FILL — ONDANSETRON HCL 4 MG TABLET: 4 | 7 days supply | Qty: 20 | Fill #2

## 2018-12-17 NOTE — Telephone Encounter (Signed)
Pt called requesting refill on estradiol (estrace) 2 mg tablet and topiramate (topamax) 100 mg tablet. Pt would like to have it sent to [facility]. Please advise.

## 2018-12-18 MED ORDER — ESTRADIOL 2 MG PO TABS: 2.0000 mg | ORAL_TABLET | Freq: Every day | ORAL | 5 refills | Status: DC

## 2018-12-18 MED ORDER — TOPIRAMATE 100 MG PO TABS: 100.0000 mg | ORAL_TABLET | Freq: Two times a day (BID) | ORAL | 5 refills | Status: DC

## 2018-12-18 MED FILL — ESTRADIOL 2 MG TABS: 2 | 30 days supply | Qty: 30 | Fill #0

## 2018-12-18 MED FILL — TOPIRAMATE 100 MG TABLET: 100 | 30 days supply | Qty: 60 | Fill #0

## 2018-12-18 NOTE — Telephone Encounter (Signed)
Medications sent to pharmacy

## 2019-01-13 ENCOUNTER — Other Ambulatory Visit: Payer: Self-pay | Admitting: Family Medicine

## 2019-01-13 MED FILL — IBUPROFEN 800 MG TAB: 800 | 7 days supply | Qty: 30 | Fill #0

## 2019-01-22 MED FILL — RIZATRIPTAN BENZOATE 10 MG: 10 | 30 days supply | Qty: 9 | Fill #2

## 2019-01-22 MED FILL — ESTRADIOL 2 MG TABS: 2 | 30 days supply | Qty: 30 | Fill #1

## 2019-01-22 MED FILL — TOPIRAMATE 100 MG TABLET: 100 | 30 days supply | Qty: 60 | Fill #1

## 2019-02-04 ENCOUNTER — Ambulatory Visit (INDEPENDENT_AMBULATORY_CARE_PROVIDER_SITE_OTHER): Payer: 59 | Admitting: Family Medicine

## 2019-02-04 ENCOUNTER — Other Ambulatory Visit: Payer: Self-pay

## 2019-02-04 ENCOUNTER — Encounter: Payer: Self-pay | Admitting: Family Medicine

## 2019-02-04 VITALS — BP 110/78 | HR 95 | Temp 97.9°F | Ht 59.0 in | Wt 128.0 lb

## 2019-02-04 DIAGNOSIS — Z0000 Encounter for general adult medical examination without abnormal findings: Secondary | ICD-10-CM | POA: Diagnosis not present

## 2019-02-04 DIAGNOSIS — Z1239 Encounter for other screening for malignant neoplasm of breast: Secondary | ICD-10-CM

## 2019-02-04 LAB — LIPID PANEL
Cholesterol: 192 mg/dL (ref 0–200)
HDL: 72.2 mg/dL (ref >39.00)
LDL Cholesterol: 100 mg/dL — ABNORMAL HIGH (ref 0–99)
NonHDL: 119.74
Total CHOL/HDL Ratio: 3
Triglycerides: 99 mg/dL (ref 0.0–149.0)
VLDL: 19.8 mg/dL (ref 0.0–40.0)

## 2019-02-04 LAB — COMPREHENSIVE METABOLIC PANEL
ALT: 22 U/L (ref 0–35)
AST: 23 U/L (ref 0–37)
Albumin: 4.3 g/dL (ref 3.5–5.2)
Alkaline Phosphatase: 65 U/L (ref 39–117)
BUN: 16 mg/dL (ref 6–23)
CO2: 23 mEq/L (ref 19–32)
Calcium: 8.9 mg/dL (ref 8.4–10.5)
Chloride: 108 mEq/L (ref 96–112)
Creatinine, Ser: 0.85 mg/dL (ref 0.40–1.20)
GFR: 69.66 mL/min (ref >60.00)
Glucose, Bld: 87 mg/dL (ref 70–99)
Potassium: 3.7 mEq/L (ref 3.5–5.1)
Sodium: 140 mEq/L (ref 135–145)
Total Bilirubin: 0.3 mg/dL (ref 0.2–1.2)
Total Protein: 6.4 g/dL (ref 6.0–8.3)

## 2019-02-04 LAB — CBC
HCT: 40.8 % (ref 36.0–46.0)
Hemoglobin: 13.9 g/dL (ref 12.0–15.0)
MCHC: 34.2 g/dL (ref 30.0–36.0)
MCV: 94 fl (ref 78.0–100.0)
Platelets: 273 10*3/uL (ref 150.0–400.0)
RBC: 4.34 Mil/uL (ref 3.87–5.11)
RDW: 12.9 % (ref 11.5–15.5)
WBC: 5.7 10*3/uL (ref 4.0–10.5)

## 2019-02-04 NOTE — Patient Instructions (Signed)
Give [place] 2-3 business days to get the results of your labs back.   Keep the diet clean and stay active.  Someone will reach out to you regarding your mammogram  Let us know if you need anything.

## 2019-02-04 NOTE — Progress Notes (Signed)
Chief Complaint  Patient presents with  . Annual Exam     Well Woman [name] is here for a complete physical.   Her last physical was >1 year ago.  Current diet: in general, a "healthy" diet. Current exercise: active. Weight is up a little and she denies daytime fatigue. No LMP recorded. Patient has had a hysterectomy. Seatbelt? Yes  Health Maintenance Pap/HPV- Yes Mammogram- No Tetanus- Yes HIV screening- Yes  Past Medical History:  Diagnosis Date  . Acne 11/01/2015  . Allergy   . Arthritis   . Asthma    childhood and rare as adult  . Blood transfusion without reported diagnosis   . Diverticulosis 02/04/2018   Mild sigmoid, noted on Colonoscopy  . Dyspareunia, female   . Family history of adverse reaction to anesthesia    " my sisiter has had Malignant hypertheria twice, I have never had that problem"; (patient had sevo and succinylcholine with no apparent complication on 51/02/58 at [facility])  . Fibula fracture 07/23/2018   Distal fibular fracture  . GERD (gastroesophageal reflux disease)   . Hemorrhoids 02/04/2018   noted on Colonoscopy  . History of appendicitis   . History of colon polyps 02/04/2018  . History of gallstones   . History of kidney stones   . Hyperlipidemia, mixed 05/17/2016   Patient denies  . IBS (irritable bowel syndrome) 01/03/2015  . IC (interstitial cystitis)   . Migraines   . Pelvic pain   . PONV (postoperative nausea and vomiting)    "difficult to wake up."  . Preventative health care 01/03/2015  . Rash and nonspecific skin eruption 11/01/2015  . Seasonal allergies     Past Surgical History:  Procedure Laterality Date  . ABDOMINAL HYSTERECTOMY    . APPENDECTOMY    . CHOLECYSTECTOMY N/A 10/12/2014   Procedure: LAPAROSCOPIC CHOLECYSTECTOMY WITH INTRAOPERATIVE CHOLANGIOGRAM;  Surgeon: Doreen Salvage, MD;  Location: [place];  Service: General;  Laterality: N/A;  . COLONOSCOPY  02/04/2018  . HEMORROIDECTOMY     young adult  .  ORIF ANKLE FRACTURE Left 07/30/2018   Procedure: OPEN REDUCTION INTERNAL FIXATION (ORIF) LEFT ANKLE FRACTURE;  Surgeon: Renette Butters, MD;  Location: [place];  Service: Orthopedics;  Laterality: Left;  . PARTIAL PROCTECTOMY BY TEM N/A 05/29/2018   Procedure: PARTIAL PROCTECTOMY BY TEM EXCISION OF ANAL TAG;  Surgeon: Michael Boston, MD;  Location: [facility];  Service: General;  Laterality: N/A;  . TOTAL KNEE ARTHROPLASTY     1 knee replacements, 7 knee revision, 28 surgeries on right knee    Medications  Current Outpatient Medications on File Prior to Visit  Medication Sig Dispense Refill  . amoxicillin (AMOXIL) 500 MG capsule Take 2,000 mg by mouth See admin instructions. Take 2000 mg by mouth 1 hour prior to dental appointment  2  . cetirizine (ZYRTEC) 10 MG tablet Take 1 tablet (10 mg total) by mouth daily. (Patient taking differently: Take 10 mg by mouth daily as needed for allergies. ) 30 tablet 1  . estradiol (ESTRACE) 2 MG tablet TAKE 1 TABLET (2 MG TOTAL) BY MOUTH DAILY. 30 tablet 5  . ibuprofen (ADVIL) 800 MG tablet TAKE 1 TABLET BY MOUTH AT ONSET OF MIGRAINE MAY REPEAT IN 6 HOURS AS NEEDED 30 tablet 5  . montelukast (SINGULAIR) 10 MG tablet Take 1 tablet (10 mg total) by mouth at bedtime. (Patient taking differently: Take 10 mg by mouth daily as needed (for allergies). ) 30 tablet 3  .  Multiple Vitamin (MULTIVITAMIN WITH MINERALS) TABS tablet Take 1 tablet by mouth daily.    . ondansetron (ZOFRAN) 4 MG tablet TAKE 1 TABLET (4 MG TOTAL) BY MOUTH EVERY 8 HOURS AS NEEDED FOR NAUSEA 20 tablet 2  . rizatriptan (MAXALT) 10 MG tablet TAKE 1 TABLET (10 MG TOTAL) BY MOUTH AS NEEDED FOR MIGRAINE. MAY REPEAT IN 2 HOURS IF NEEDED 10 tablet 5  . SUMAtriptan (IMITREX) 50 MG tablet TAKE 1 TABLET BY MOUTH AT HEADACHE ONSET. MAY REPEAT IN 2 HOURS ONE TIME ONLY 9 tablet 5  . topiramate (TOPAMAX) 100 MG tablet TAKE 1 TABLET (100 MG TOTAL) BY MOUTH 2 (TWO) TIMES DAILY. 60 tablet 5    Allergies Allergies  Allergen Reactions  . Latex Anaphylaxis  . Phenergan [Promethazine Hcl] Other (See Comments)    Shaky, tremors  . Reglan [Metoclopramide] Anxiety    Review of Systems: Constitutional:  no unexpected weight changes Eye:  no recent significant change in vision Ear/Nose/Mouth/Throat:  Ears:  no tinnitus or vertigo and no recent change in hearing Nose/Mouth/Throat:  no complaints of nasal congestion, no sore throat Cardiovascular: no chest pain, +intermittent palpitations Respiratory:  no cough and no shortness of breath Gastrointestinal:  no abdominal pain, no change in bowel habits GU:  Female: negative for dysuria or pelvic pain Musculoskeletal/Extremities:  no pain of the joints Integumentary (Skin/Breast):  no abnormal skin lesions reported Neurologic:  no headaches Endocrine:  denies fatigue Hematologic/Lymphatic:  No areas of easy bleeding  Exam BP 110/78 (BP Location: Left Arm, Patient Position: Sitting, Cuff Size: Normal)   Pulse 95   Temp 97.9 F (36.6 C) (Oral)   Ht 4\' 11"  (1.499 m)   Wt 128 lb (58.1 kg)   SpO2 100%   BMI 25.85 kg/m  General:  well developed, well nourished, in no apparent distress Skin:  no significant moles, warts, or growths Head:  no masses, lesions, or tenderness Eyes:  pupils equal and round, sclera anicteric without injection Ears:  canals without lesions, TMs shiny without retraction, no obvious effusion, no erythema Nose:  nares patent, septum midline, mucosa normal, and no drainage or sinus tenderness Throat/Pharynx:  lips and gingiva without lesion; tongue and uvula midline; non-inflamed pharynx; no exudates or postnasal drainage Neck: neck supple without adenopathy, thyromegaly, or masses Lungs:  clear to auscultation, breath sounds equal bilaterally, no respiratory distress Cardio:  regular rate and rhythm, no bruits, no LE edema Abdomen:  abdomen soft, nontender; bowel sounds normal; no masses or  organomegaly Genital: Defer to GYN Musculoskeletal:  symmetrical muscle groups noted without atrophy or deformity Extremities:  no clubbing, cyanosis, or edema, no deformities, no skin discoloration Neuro:  gait normal; deep tendon reflexes normal and symmetric Psych: well oriented with normal range of affect and appropriate judgment/insight  Assessment and Plan  Well adult exam - Plan: CBC, Comprehensive metabolic panel, Lipid panel  Screening for breast cancer - Plan: MM DIGITAL SCREENING BILATERAL   Well 53 y.o. female. Counseled on diet and exercise. Other orders as above. Discussed palpitations. Would like to change hydration, let us know if it bothers her. Likely PVC's. Offered Holter, she will let us know if she changfes her mind.  Follow up w reg PCP as originally scheduled. The patient voiced understanding and agreement to the plan.  Briny Breezes, DO 02/04/19 1:19 PM

## 2019-02-17 MED FILL — ESTRADIOL 2 MG TABS: 2 | 30 days supply | Qty: 30 | Fill #2

## 2019-02-17 MED FILL — TOPIRAMATE 100 MG TABLET: 100 | 30 days supply | Qty: 60 | Fill #2

## 2019-02-18 ENCOUNTER — Ambulatory Visit (HOSPITAL_BASED_OUTPATIENT_CLINIC_OR_DEPARTMENT_OTHER)
Admission: RE | Admit: 2019-02-18 | Discharge: 2019-02-18 | Disposition: A | Discharge status: Home / Self Care | Payer: 59 | Source: Ambulatory Visit | Attending: Family Medicine | Admitting: Family Medicine

## 2019-02-18 ENCOUNTER — Other Ambulatory Visit: Payer: Self-pay

## 2019-02-18 DIAGNOSIS — Z1231 Encounter for screening mammogram for malignant neoplasm of breast: Secondary | ICD-10-CM | POA: Insufficient documentation

## 2019-02-18 DIAGNOSIS — Z1239 Encounter for other screening for malignant neoplasm of breast: Secondary | ICD-10-CM

## 2019-03-20 ENCOUNTER — Other Ambulatory Visit: Payer: Self-pay | Admitting: Family Medicine

## 2019-03-20 MED FILL — ESTRADIOL 2 MG TABS: 2 | 30 days supply | Qty: 30 | Fill #3

## 2019-03-20 MED FILL — TOPIRAMATE 100 MG TABLET: 100 | 30 days supply | Qty: 60 | Fill #3

## 2019-03-20 MED FILL — ONDANSETRON HCL 4 MG TABLET: 4 | 7 days supply | Qty: 20 | Fill #0

## 2019-03-20 MED FILL — RIZATRIPTAN BENZOATE 10 MG: 10 | 30 days supply | Qty: 9 | Fill #3

## 2019-03-20 MED FILL — SUMATRIPTAN SUCC 50 MG TAB: 50 | 15 days supply | Qty: 9 | Fill #3

## 2019-03-24 MED FILL — AMOXICILLIN 500 MG CAPSULE: 500 | 1 days supply | Qty: 4 | Fill #0

## 2019-04-18 MED FILL — ESTRADIOL 2 MG TABS: 2 | 30 days supply | Qty: 30 | Fill #4

## 2019-04-18 MED FILL — AMOXICILLIN 500 MG CAPSULE: 500 | 1 days supply | Qty: 4 | Fill #1

## 2019-04-18 MED FILL — TOPIRAMATE 100 MG TABLET: 100 | 30 days supply | Qty: 60 | Fill #4

## 2019-05-01 MED FILL — IBUPROFEN 800 MG TAB: 800 | 7 days supply | Qty: 30 | Fill #1

## 2019-05-01 MED FILL — RIZATRIPTAN BENZOATE 10 MG: 10 | 30 days supply | Qty: 9 | Fill #4

## 2019-05-01 MED FILL — SUMATRIPTAN SUCC 50 MG TAB: 50 | 15 days supply | Qty: 9 | Fill #4

## 2019-05-15 MED FILL — CLINDAMYCIN HCL 150 MG CAPS: 150 | 7 days supply | Qty: 28 | Fill #0

## 2019-05-19 MED FILL — TOPIRAMATE 100 MG TABLET: 100 | 30 days supply | Qty: 60 | Fill #5

## 2019-05-19 MED FILL — ONDANSETRON HCL 4 MG TABLET: 4 | 7 days supply | Qty: 20 | Fill #1

## 2019-05-19 MED FILL — ESTRADIOL 2 MG TABS: 2 | 30 days supply | Qty: 30 | Fill #5

## 2019-06-02 MED FILL — AMOXICILLIN 500 MG CAPSULE: 500 | 1 days supply | Qty: 4 | Fill #2

## 2019-06-06 MED FILL — AMOXICILLIN 500 MG CAPSULE: 500 | 7 days supply | Qty: 21 | Fill #0

## 2019-06-12 ENCOUNTER — Encounter: Payer: Self-pay | Admitting: Gastroenterology

## 2019-06-16 MED FILL — ESTRADIOL 2 MG TABS: 2 | 30 days supply | Qty: 30 | Fill #0

## 2019-06-16 MED FILL — TOPIRAMATE 100 MG TABLET: 100 | 30 days supply | Qty: 60 | Fill #0

## 2019-06-23 MED FILL — AMOXICILLIN 500 MG CAPSULE: 500 | 7 days supply | Qty: 21 | Fill #0

## 2019-06-30 MED FILL — SUMATRIPTAN SUCC 50 MG TAB: 50 | 15 days supply | Qty: 9 | Fill #5

## 2019-06-30 MED FILL — RIZATRIPTAN BENZOATE 10 MG: 10 | 30 days supply | Qty: 9 | Fill #5

## 2019-07-18 ENCOUNTER — Other Ambulatory Visit: Payer: Self-pay

## 2019-07-18 ENCOUNTER — Telehealth: Payer: Self-pay | Admitting: Family Medicine

## 2019-07-18 ENCOUNTER — Encounter: Payer: Self-pay | Admitting: Family Medicine

## 2019-07-18 ENCOUNTER — Ambulatory Visit: Payer: Self-pay

## 2019-07-18 ENCOUNTER — Ambulatory Visit (INDEPENDENT_AMBULATORY_CARE_PROVIDER_SITE_OTHER): Payer: 59 | Admitting: Family Medicine

## 2019-07-18 VITALS — Temp 101.0°F

## 2019-07-18 DIAGNOSIS — R109 Unspecified abdominal pain: Secondary | ICD-10-CM

## 2019-07-18 DIAGNOSIS — R509 Fever, unspecified: Secondary | ICD-10-CM | POA: Diagnosis not present

## 2019-07-18 MED ORDER — FLUCONAZOLE 150 MG PO TABS: 150.0000 mg | ORAL_TABLET | Freq: Once | ORAL | 0 refills | Status: AC

## 2019-07-18 MED ORDER — TRAMADOL HCL 50 MG PO TABS: 50.0000 mg | ORAL_TABLET | Freq: Three times a day (TID) | ORAL | 0 refills | Status: AC | PRN

## 2019-07-18 MED ORDER — CEFDINIR 300 MG PO CAPS: 300.0000 mg | ORAL_CAPSULE | Freq: Two times a day (BID) | ORAL | 0 refills | Status: AC

## 2019-07-18 MED ORDER — TAMSULOSIN HCL 0.4 MG PO CAPS: 0.4000 mg | ORAL_CAPSULE | Freq: Every day | ORAL | 0 refills | Status: DC

## 2019-07-18 MED FILL — traMADol HCL 50 MG TABS: 50 | 5 days supply | Qty: 15 | Fill #0

## 2019-07-18 MED FILL — CEFDINIR 300 MG CAPSULE: 300 | 7 days supply | Qty: 14 | Fill #0

## 2019-07-18 MED FILL — FLUCONAZOLE 150 MG TABS: 150 | 1 days supply | Qty: 1 | Fill #0

## 2019-07-18 MED FILL — TAMSULOSIN HCL 0.4 MG CAP: 0.4 | 20 days supply | Qty: 20 | Fill #0

## 2019-07-18 NOTE — Telephone Encounter (Signed)
Pt. Reports she started having right flank pain last week, on and off. Yesterday the pain became constant and has radiated into her abdomen. Having nausea as well. Report she passed a kidney stone 3 years ago and "is pretty sure that's what this is." Warm transfer to [place] in the practice for a visit.  Answer Assessment - Initial Assessment Questions 1. LOCATION: "Where does it hurt?" (e.g., left, right)     Right flank and abdomen 2. ONSET: "When did the pain start?"     Started last week 3. SEVERITY: "How bad is the pain?" (e.g., Scale 1-10; mild, moderate, or severe)   - MILD (1-3): doesn't interfere with normal activities    - MODERATE (4-7): interferes with normal activities or awakens from sleep    - SEVERE (8-10): excruciating pain and patient unable to do normal activities (stays in bed)       8 4. PATTERN: "Does the pain come and go, or is it constant?"      Constant 5. CAUSE: "What do you think is causing the pain?"     Kidney stone 6. OTHER SYMPTOMS:  "Do you have any other symptoms?" (e.g., fever, abdominal pain, vomiting, leg weakness, burning with urination, blood in urine)     Right abd. pain 7. PREGNANCY:  "Is there any chance you are pregnant?" "When was your last menstrual period?"     No  Protocols used: FLANK PAIN-A-AH

## 2019-07-18 NOTE — Progress Notes (Signed)
Chief Complaint  Patient presents with  . Flank Pain    right (one week)    Subjective: Patient is a 53 y.o. female here for flank pain. Due to COVID-19 pandemic, we are interacting via web portal for an electronic face-to-face visit. I verified patient's ID using 2 identifiers. Patient agreed to proceed with visit via this method. Patient is at home, I am at office. Patient and I are present for visit.   8 d ago started having R flank pain. She has a hx of kidney stones. Feels a little better today. Is having associated N/V and increasing pain. Has been using Zofran and Ultram for relief that is helping a little. She is also pushing a lot of fluids. Spiked a fever this AM of 101 F. No URi s/s's, works in ED as a Marine scientist. Has been very cautious when dealing with URI pts.   ROS: MSK: +flank pain  Past Medical History:  Diagnosis Date  . Acne 11/01/2015  . Allergy   . Arthritis   . Asthma    childhood and rare as adult  . Blood transfusion without reported diagnosis   . Diverticulosis 02/04/2018   Mild sigmoid, noted on Colonoscopy  . Dyspareunia, female   . Family history of adverse reaction to anesthesia    " my sisiter has had Malignant hypertheria twice, I have never had that problem"; (patient had sevo and succinylcholine with no apparent complication on [id number] at [facility])  . Fibula fracture 07/23/2018   Distal fibular fracture  . GERD (gastroesophageal reflux disease)   . Hemorrhoids 02/04/2018   noted on Colonoscopy  . History of appendicitis   . History of colon polyps 02/04/2018  . History of gallstones   . History of kidney stones   . Hyperlipidemia, mixed 05/17/2016   Patient denies  . IBS (irritable bowel syndrome) 01/03/2015  . IC (interstitial cystitis)   . Migraines   . Pelvic pain   . PONV (postoperative nausea and vomiting)    "difficult to wake up."  . Preventative health care 01/03/2015  . Rash and nonspecific skin eruption 11/01/2015  . Seasonal  allergies     Objective: Temp (!) 101 F (38.3 C) (Oral)  No conversational dyspnea Age appropriate judgment and insight Nml affect and mood  Assessment and Plan: Flank pain - Plan: tamsulosin (FLOMAX) 0.4 MG CAPS capsule, traMADol (ULTRAM) 50 MG tablet, cefdinir (OMNICEF) 300 MG capsule, fluconazole (DIFLUCAN) 150 MG tablet  Fever, unspecified fever cause  Unlikely to be a septic stone. Will tx for kidney stone and pyelo. F/u prn.  The patient voiced understanding and agreement to the plan.  [place], DO 07/18/19  2:23 PM

## 2019-07-18 NOTE — Telephone Encounter (Signed)
Copied from [place] [id number]. Topic: General - Other >> Jul 18, 2019 10:10 AM Leward Quan A wrote: Reason for CRM: Patient called to say that she need Dr Charlett Blake to schedule her for a CT Scan for kidney stones. Per patient the pain have moved from her back to her side and it is now non stop to the point where she is now vomiting from the pain. Patient can be reached at  Ph# [phone]

## 2019-07-21 NOTE — Telephone Encounter (Signed)
LM requesting call back to discuss symptoms and CT.

## 2019-07-22 ENCOUNTER — Encounter: Payer: Self-pay | Admitting: Family Medicine

## 2019-07-22 ENCOUNTER — Other Ambulatory Visit: Payer: Self-pay | Admitting: Family Medicine

## 2019-07-22 DIAGNOSIS — R1011 Right upper quadrant pain: Secondary | ICD-10-CM

## 2019-07-28 ENCOUNTER — Other Ambulatory Visit: Payer: Self-pay

## 2019-07-28 ENCOUNTER — Other Ambulatory Visit: Payer: Self-pay | Admitting: Family Medicine

## 2019-07-28 ENCOUNTER — Encounter: Payer: Self-pay | Admitting: Family Medicine

## 2019-07-28 ENCOUNTER — Ambulatory Visit (HOSPITAL_BASED_OUTPATIENT_CLINIC_OR_DEPARTMENT_OTHER)
Admission: RE | Admit: 2019-07-28 | Discharge: 2019-07-28 | Disposition: A | Discharge status: Home / Self Care | Payer: 59 | Source: Ambulatory Visit | Attending: Family Medicine | Admitting: Family Medicine

## 2019-07-28 DIAGNOSIS — N200 Calculus of kidney: Secondary | ICD-10-CM

## 2019-07-28 DIAGNOSIS — R1011 Right upper quadrant pain: Secondary | ICD-10-CM | POA: Insufficient documentation

## 2019-07-28 DIAGNOSIS — R109 Unspecified abdominal pain: Secondary | ICD-10-CM | POA: Diagnosis not present

## 2019-07-28 MED FILL — IBUPROFEN 800 MG TAB: 800 | 7 days supply | Qty: 30 | Fill #2

## 2019-07-28 MED FILL — ESTRADIOL 2 MG TABS: 2 | 30 days supply | Qty: 30 | Fill #1

## 2019-07-28 MED FILL — RIZATRIPTAN BENZOATE 10 MG: 10 | 20 days supply | Qty: 6 | Fill #6

## 2019-07-28 MED FILL — TOPIRAMATE 100 MG TABLET: 100 | 30 days supply | Qty: 60 | Fill #1

## 2019-07-29 ENCOUNTER — Encounter: Payer: Self-pay | Admitting: Family Medicine

## 2019-07-29 ENCOUNTER — Other Ambulatory Visit: Payer: Self-pay | Admitting: Family Medicine

## 2019-07-29 MED ORDER — TRAMADOL HCL 50 MG PO TABS: 50.0000 mg | ORAL_TABLET | Freq: Three times a day (TID) | ORAL | 0 refills | Status: AC | PRN

## 2019-07-29 MED FILL — SUMATRIPTAN SUCC 50 MG TAB: 50 | 30 days supply | Qty: 9 | Fill #0

## 2019-07-29 MED FILL — traMADol HCL 50 MG TABS: 50 | 7 days supply | Qty: 20 | Fill #0

## 2019-08-04 ENCOUNTER — Encounter (HOSPITAL_COMMUNITY)
Admission: RE | Disposition: A | Discharge status: Home / Self Care | Payer: Self-pay | Source: Other Acute Inpatient Hospital | Attending: Urology

## 2019-08-04 ENCOUNTER — Inpatient Hospital Stay (HOSPITAL_COMMUNITY): Payer: 59 | Admitting: Anesthesiology

## 2019-08-04 ENCOUNTER — Other Ambulatory Visit (HOSPITAL_COMMUNITY): Payer: 59

## 2019-08-04 ENCOUNTER — Ambulatory Visit (HOSPITAL_COMMUNITY)
Admission: RE | Admit: 2019-08-04 | Discharge: 2019-08-04 | Disposition: A | Discharge status: Home / Self Care | Payer: 59 | Source: Other Acute Inpatient Hospital | Attending: Urology | Admitting: Urology

## 2019-08-04 ENCOUNTER — Inpatient Hospital Stay (HOSPITAL_COMMUNITY): Payer: 59

## 2019-08-04 ENCOUNTER — Encounter (HOSPITAL_COMMUNITY): Payer: Self-pay | Admitting: Urology

## 2019-08-04 ENCOUNTER — Other Ambulatory Visit: Payer: Self-pay | Admitting: Urology

## 2019-08-04 DIAGNOSIS — Z8249 Family history of ischemic heart disease and other diseases of the circulatory system: Secondary | ICD-10-CM | POA: Insufficient documentation

## 2019-08-04 DIAGNOSIS — Z87442 Personal history of urinary calculi: Secondary | ICD-10-CM | POA: Diagnosis not present

## 2019-08-04 DIAGNOSIS — K219 Gastro-esophageal reflux disease without esophagitis: Secondary | ICD-10-CM | POA: Diagnosis not present

## 2019-08-04 DIAGNOSIS — E782 Mixed hyperlipidemia: Secondary | ICD-10-CM | POA: Diagnosis not present

## 2019-08-04 DIAGNOSIS — Z20822 Contact with and (suspected) exposure to covid-19: Secondary | ICD-10-CM | POA: Diagnosis not present

## 2019-08-04 DIAGNOSIS — M1990 Unspecified osteoarthritis, unspecified site: Secondary | ICD-10-CM | POA: Insufficient documentation

## 2019-08-04 DIAGNOSIS — Z79899 Other long term (current) drug therapy: Secondary | ICD-10-CM | POA: Insufficient documentation

## 2019-08-04 DIAGNOSIS — Z888 Allergy status to other drugs, medicaments and biological substances status: Secondary | ICD-10-CM | POA: Diagnosis not present

## 2019-08-04 DIAGNOSIS — N201 Calculus of ureter: Secondary | ICD-10-CM | POA: Insufficient documentation

## 2019-08-04 DIAGNOSIS — N202 Calculus of kidney with calculus of ureter: Secondary | ICD-10-CM | POA: Diagnosis present

## 2019-08-04 DIAGNOSIS — G43909 Migraine, unspecified, not intractable, without status migrainosus: Secondary | ICD-10-CM | POA: Diagnosis not present

## 2019-08-04 DIAGNOSIS — Z791 Long term (current) use of non-steroidal anti-inflammatories (NSAID): Secondary | ICD-10-CM | POA: Diagnosis not present

## 2019-08-04 HISTORY — PX: CYSTOSCOPY WITH RETROGRADE PYELOGRAM, URETEROSCOPY AND STENT PLACEMENT: SHX5789

## 2019-08-04 LAB — BASIC METABOLIC PANEL
Anion gap: 13 (ref 5–15)
BUN: 13 mg/dL (ref 6–20)
CO2: 22 mmol/L (ref 22–32)
Calcium: 8.9 mg/dL (ref 8.9–10.3)
Chloride: 107 mmol/L (ref 98–111)
Creatinine, Ser: 0.85 mg/dL (ref 0.44–1.00)
GFR calc Af Amer: >60 mL/min (ref >60)
GFR calc non Af Amer: >60 mL/min (ref >60)
Glucose, Bld: 88 mg/dL (ref 70–99)
Potassium: 3.5 mmol/L (ref 3.5–5.1)
Sodium: 142 mmol/L (ref 135–145)

## 2019-08-04 LAB — CBC
HCT: 36.9 % (ref 36.0–46.0)
Hemoglobin: 11.9 g/dL — ABNORMAL LOW (ref 12.0–15.0)
MCH: 31.4 pg (ref 26.0–34.0)
MCHC: 32.2 g/dL (ref 30.0–36.0)
MCV: 97.4 fL (ref 80.0–100.0)
Platelets: 462 10*3/uL — ABNORMAL HIGH (ref 150–400)
RBC: 3.79 MIL/uL — ABNORMAL LOW (ref 3.87–5.11)
RDW: 12.8 % (ref 11.5–15.5)
WBC: 6.4 10*3/uL (ref 4.0–10.5)
nRBC: 0 % (ref 0.0–0.2)

## 2019-08-04 LAB — RESPIRATORY PANEL BY RT PCR (FLU A&B, COVID)
Influenza A by PCR: NEGATIVE
Influenza B by PCR: NEGATIVE
SARS Coronavirus 2 by RT PCR: NEGATIVE

## 2019-08-04 SURGERY — CYSTOURETEROSCOPY, WITH RETROGRADE PYELOGRAM AND STENT INSERTION
Anesthesia: General | Laterality: Right

## 2019-08-04 MED ORDER — PHENYLEPHRINE 40 MCG/ML (10ML) SYRINGE FOR IV PUSH (FOR BLOOD PRESSURE SUPPORT)
PREFILLED_SYRINGE | INTRAVENOUS | Status: DC | PRN
  Administered 2019-08-04 (×4): 40 ug via INTRAVENOUS

## 2019-08-04 MED ORDER — CEFAZOLIN SODIUM-DEXTROSE 2-4 GM/100ML-% IV SOLN
2.0000 g | INTRAVENOUS | Status: AC
  Administered 2019-08-04: 2 g via INTRAVENOUS
  Filled 2019-08-04: qty 100

## 2019-08-04 MED ORDER — ACETAMINOPHEN 325 MG PO TABS: 650.0000 mg | ORAL_TABLET | ORAL | Status: DC | PRN

## 2019-08-04 MED ORDER — FENTANYL CITRATE (PF) 100 MCG/2ML IJ SOLN: 25.0000 ug | INTRAMUSCULAR | Status: DC | PRN

## 2019-08-04 MED ORDER — DEXAMETHASONE SODIUM PHOSPHATE 10 MG/ML IJ SOLN
INTRAMUSCULAR | Status: DC | PRN
  Administered 2019-08-04: 10 mg via INTRAVENOUS

## 2019-08-04 MED ORDER — PROPOFOL 10 MG/ML IV BOLUS
INTRAVENOUS | Status: DC | PRN
  Administered 2019-08-04: 130 mg via INTRAVENOUS

## 2019-08-04 MED ORDER — OXYCODONE HCL 5 MG PO TABS
ORAL_TABLET | ORAL | Status: AC
  Administered 2019-08-04: 5 mg via ORAL
  Filled 2019-08-04: qty 1

## 2019-08-04 MED ORDER — LACTATED RINGERS IV SOLN: INTRAVENOUS | Status: DC

## 2019-08-04 MED ORDER — ONDANSETRON HCL 4 MG/2ML IJ SOLN
INTRAMUSCULAR | Status: DC | PRN
  Administered 2019-08-04: 4 mg via INTRAVENOUS

## 2019-08-04 MED ORDER — LIDOCAINE 2% (20 MG/ML) 5 ML SYRINGE
INTRAMUSCULAR | Status: DC | PRN
  Administered 2019-08-04: 60 mg via INTRAVENOUS

## 2019-08-04 MED ORDER — OXYCODONE HCL 5 MG/5ML PO SOLN: 5.0000 mg | Freq: Once | ORAL | Status: AC | PRN

## 2019-08-04 MED ORDER — PROPOFOL 10 MG/ML IV BOLUS
INTRAVENOUS | Status: AC
  Filled 2019-08-04: qty 20

## 2019-08-04 MED ORDER — ONDANSETRON HCL 4 MG/2ML IJ SOLN: 4.0000 mg | Freq: Once | INTRAMUSCULAR | Status: AC

## 2019-08-04 MED ORDER — IOHEXOL 300 MG/ML  SOLN
INTRAMUSCULAR | Status: DC | PRN
  Administered 2019-08-04: 5 mL

## 2019-08-04 MED ORDER — KETOROLAC TROMETHAMINE 30 MG/ML IJ SOLN
INTRAMUSCULAR | Status: AC
  Filled 2019-08-04: qty 1

## 2019-08-04 MED ORDER — OXYCODONE HCL 5 MG PO TABS: 5.0000 mg | ORAL_TABLET | ORAL | Status: DC | PRN

## 2019-08-04 MED ORDER — ONDANSETRON HCL 4 MG/2ML IJ SOLN
INTRAMUSCULAR | Status: AC
  Administered 2019-08-04: 18:00:00 4 mg via INTRAVENOUS
  Filled 2019-08-04: qty 2

## 2019-08-04 MED ORDER — FENTANYL CITRATE (PF) 100 MCG/2ML IJ SOLN
INTRAMUSCULAR | Status: AC
  Administered 2019-08-04: 50 ug via INTRAVENOUS
  Filled 2019-08-04: qty 2

## 2019-08-04 MED ORDER — SODIUM CHLORIDE 0.9 % IR SOLN
Status: DC | PRN
  Administered 2019-08-04: 3000 mL via INTRAVESICAL

## 2019-08-04 MED ORDER — SODIUM CHLORIDE 0.9% FLUSH: 3.0000 mL | INTRAVENOUS | Status: DC | PRN

## 2019-08-04 MED ORDER — OXYCODONE HCL 5 MG PO TABS: 5.0000 mg | ORAL_TABLET | Freq: Once | ORAL | Status: AC | PRN

## 2019-08-04 MED ORDER — MIDAZOLAM HCL 2 MG/2ML IJ SOLN
INTRAMUSCULAR | Status: AC
  Filled 2019-08-04: qty 2

## 2019-08-04 MED ORDER — KETOROLAC TROMETHAMINE 30 MG/ML IJ SOLN: 30.0000 mg | Freq: Once | INTRAMUSCULAR | Status: DC | PRN

## 2019-08-04 MED ORDER — FENTANYL CITRATE (PF) 100 MCG/2ML IJ SOLN
INTRAMUSCULAR | Status: AC
  Filled 2019-08-04: qty 2

## 2019-08-04 MED ORDER — SODIUM CHLORIDE 0.9% FLUSH: 3.0000 mL | Freq: Two times a day (BID) | INTRAVENOUS | Status: DC

## 2019-08-04 MED ORDER — MORPHINE SULFATE (PF) 4 MG/ML IV SOLN: 2.0000 mg | INTRAVENOUS | Status: DC | PRN

## 2019-08-04 MED ORDER — MIDAZOLAM HCL 5 MG/5ML IJ SOLN
INTRAMUSCULAR | Status: DC | PRN
  Administered 2019-08-04: 1 mg via INTRAVENOUS

## 2019-08-04 MED ORDER — FENTANYL CITRATE (PF) 100 MCG/2ML IJ SOLN
INTRAMUSCULAR | Status: DC | PRN
  Administered 2019-08-04: 50 ug via INTRAVENOUS

## 2019-08-04 MED ORDER — SODIUM CHLORIDE 0.9 % IV SOLN: 250.0000 mL | INTRAVENOUS | Status: DC | PRN

## 2019-08-04 MED ORDER — KETOROLAC TROMETHAMINE 30 MG/ML IJ SOLN
INTRAMUSCULAR | Status: DC | PRN
  Administered 2019-08-04: 30 mg via INTRAVENOUS

## 2019-08-04 MED ORDER — ONDANSETRON HCL 4 MG/2ML IJ SOLN
INTRAMUSCULAR | Status: AC
  Filled 2019-08-04: qty 2

## 2019-08-04 MED ORDER — ACETAMINOPHEN 650 MG RE SUPP
650.0000 mg | RECTAL | Status: DC | PRN
  Filled 2019-08-04: qty 1

## 2019-08-04 SURGICAL SUPPLY — 24 items
BAG URO CATCHER STRL LF (MISCELLANEOUS) ×2 IMPLANT
BASKET STONE NCOMPASS (UROLOGICAL SUPPLIES) IMPLANT
CATH URET 5FR 28IN OPEN ENDED (CATHETERS) IMPLANT
CATH URET DUAL LUMEN 6-10FR 50 (CATHETERS) ×2 IMPLANT
CLOTH BEACON ORANGE TIMEOUT ST (SAFETY) ×2 IMPLANT
EXTRACTOR STONE NITINOL NGAGE (UROLOGICAL SUPPLIES) ×4 IMPLANT
FIBER LASER FLEXIVA 1000 (UROLOGICAL SUPPLIES) IMPLANT
FIBER LASER FLEXIVA 365 (UROLOGICAL SUPPLIES) IMPLANT
FIBER LASER FLEXIVA 550 (UROLOGICAL SUPPLIES) IMPLANT
FIBER LASER TRAC TIP (UROLOGICAL SUPPLIES) IMPLANT
GLOVE SURG SS PI 8.0 STRL IVOR (GLOVE) IMPLANT
GOWN STRL REUS W/TWL XL LVL3 (GOWN DISPOSABLE) ×2 IMPLANT
GUIDEWIRE STR DUAL SENSOR (WIRE) ×2 IMPLANT
IV NS 1000ML (IV SOLUTION) ×1
IV NS 1000ML BAXH (IV SOLUTION) ×1 IMPLANT
IV NS IRRIG 3000ML ARTHROMATIC (IV SOLUTION) ×2 IMPLANT
KIT BALLN UROMAX 15FX4 (MISCELLANEOUS) ×1 IMPLANT
KIT BALLN UROMAX 26 75X4 (MISCELLANEOUS) ×1
KIT TURNOVER KIT A (KITS) IMPLANT
MANIFOLD NEPTUNE II (INSTRUMENTS) ×2 IMPLANT
PACK CYSTO (CUSTOM PROCEDURE TRAY) ×2 IMPLANT
SHEATH URETERAL 12FRX35CM (MISCELLANEOUS) ×2 IMPLANT
TUBING CONNECTING 10 (TUBING) ×2 IMPLANT
TUBING UROLOGY SET (TUBING) ×2 IMPLANT

## 2019-08-04 NOTE — Anesthesia Postprocedure Evaluation (Signed)
Anesthesia Post Note  Patient: Alexandra Cabrera  Procedure(s) Performed: CYSTOSCOPY WITH RETROGRADE PYELOGRAM, RIGHT URETEROSCOPY, BASKET STONE EXTRACTION (Right )     Patient location during evaluation: PACU Anesthesia Type: General Level of consciousness: awake and alert Pain management: pain level controlled Vital Signs Assessment: post-procedure vital signs reviewed and stable Respiratory status: spontaneous breathing, nonlabored ventilation, respiratory function stable and patient connected to nasal cannula oxygen Cardiovascular status: blood pressure returned to baseline and stable Postop Assessment: no apparent nausea or vomiting Anesthetic complications: no    Last Vitals:  Vitals:   08/04/19 1800 08/04/19 1815  BP: 107/69 96/83  Pulse: 70 (!) 58  Resp: 12 12  Temp:    SpO2: 98% 100%    Last Pain:  Vitals:   08/04/19 1800  TempSrc:   PainSc: 1                  Cristian Davitt S

## 2019-08-04 NOTE — Anesthesia Preprocedure Evaluation (Signed)
Anesthesia Evaluation  Patient identified by MRN, date of birth, ID band Patient awake    Reviewed: Allergy & Precautions, NPO status , Patient's Chart, lab work & pertinent test results  Airway Mallampati: II  TM Distance: >3 FB Neck ROM: Full    Dental no notable dental hx.    Pulmonary neg pulmonary ROS,    Pulmonary exam normal breath sounds clear to auscultation       Cardiovascular negative cardio ROS Normal cardiovascular exam Rhythm:Regular Rate:Normal     Neuro/Psych negative neurological ROS  negative psych ROS   GI/Hepatic Neg liver ROS, GERD  ,occasional   Endo/Other  negative endocrine ROS  Renal/GU negative Renal ROS  negative genitourinary   Musculoskeletal negative musculoskeletal ROS (+)   Abdominal   Peds negative pediatric ROS (+)  Hematology negative hematology ROS (+)   Anesthesia Other Findings   Reproductive/Obstetrics negative OB ROS                             Anesthesia Physical Anesthesia Plan  ASA: II  Anesthesia Plan: General   Post-op Pain Management:    Induction: Intravenous  PONV Risk Score and Plan: 3 and Ondansetron, Dexamethasone, Treatment may vary due to age or medical condition and Midazolam  Airway Management Planned: LMA  Additional Equipment:   Intra-op Plan:   Post-operative Plan: Extubation in OR  Informed Consent: I have reviewed the patients History and Physical, chart, labs and discussed the procedure including the risks, benefits and alternatives for the proposed anesthesia with the patient or authorized representative who has indicated his/her understanding and acceptance.     Dental advisory given  Plan Discussed with: CRNA and Surgeon  Anesthesia Plan Comments:         Anesthesia Quick Evaluation

## 2019-08-04 NOTE — Progress Notes (Signed)
Pt discharged in NAD, VSS, pain tolerable. Pt given discharge instructions. All questions answered. Pt discharged home with husband.

## 2019-08-04 NOTE — Transfer of Care (Signed)
Immediate Anesthesia Transfer of Care Note  Patient: Alexandra Cabrera  Procedure(s) Performed: CYSTOSCOPY WITH RETROGRADE PYELOGRAM, RIGHT URETEROSCOPY, BASKET STONE EXTRACTION (Right )  Patient Location: PACU  Anesthesia Type:General  Level of Consciousness: drowsy and patient cooperative  Airway & Oxygen Therapy: Patient Spontanous Breathing and Patient connected to face mask oxygen  Post-op Assessment: Report given to RN and Post -op Vital signs reviewed and stable  Post vital signs: Reviewed and stable  Last Vitals:  Vitals Value Taken Time  BP 113/75 08/04/19 1700  Temp    Pulse 92 08/04/19 1703  Resp 20 08/04/19 1703  SpO2 100 % 08/04/19 1703  Vitals shown include unvalidated device data.  Last Pain:  Vitals:   08/04/19 1315  TempSrc: Oral  PainSc: 0-No pain         Complications: No apparent anesthesia complications

## 2019-08-04 NOTE — Interval H&P Note (Signed)
History and Physical Interval Note:  She remains symptomatic.   08/04/2019 4:13 PM  [name]  has presented today for surgery, with the diagnosis of Right UVJ stone.  The various methods of treatment have been discussed with the patient and family. After consideration of risks, benefits and other options for treatment, the patient has consented to  Procedure(s): CYSTOSCOPY WITH RETROGRADE PYELOGRAM, URETEROSCOPY, LASER,  AND STENT PLACEMENT (Right) as a surgical intervention.  The patient's history has been reviewed, patient examined, no change in status, stable for surgery.  I have reviewed the patient's chart and labs.  Questions were answered to the patient's satisfaction.     Alexandra Cabrera

## 2019-08-04 NOTE — Op Note (Signed)
Procedure: 1.  Cystoscopy with right retrograde pyelogram and interpretation. 2.  Right ureteroscopic stone extraction.  Preop diagnosis: Right distal ureteral stone.  Postop diagnosis: Same.  Surgeon: Dr. Irine Seal.  Anesthesia: General.  Specimen: Stone.  Drain: None.  EBL: None.  Complications: None.  Indications: Alexandra Cabrera is a 54 year old female with a history of stones who presented to the office today with a 5 mm right distal stone with significant pain.  She elected ureteroscopy for management.  Procedure: She was given Ancef.  A general anesthetic was induced.  She was placed in lithotomy position and fitted with PAS hose.  Her perineum and genitalia were prepped with Betadine solution she was draped in usual sterile fashion.  Cystoscopy was performed with the 23 [place] scope and 30 degree lens.  Examination revealed a normal urethra.  The bladder wall had mild trabeculation.  No tumors, stones or inflammation were noted.  Ureteral orifices were unremarkable.  The right ureteral orifice was cannulated with a 5 French opening catheter and contrast was instilled.  The right retrograde pyelogram demonstrated  a calcification in the right pelvis on the precontrast films that was noted to be intraureteral on the retrograde and consistent with her stone.  There was mild proximal dilation.  The guidewire was placed through the open-ended catheter to the kidney and the opening catheter was removed.  A 4 cm x 15 French high-pressure balloon was placed over the wire and the distal ureter was dilated to 20 atm.  The balloon was then removed, the wire came out with the balloon.  The 6.5 French semirigid ureteroscope was then passed per urethra and easily advanced up the right ureteral orifice.  The stone was identified and was grasped with an engage basket and removed intact.  Subsequent inspection of the ureter demonstrated no significant ureteral injury or inflammation and it was not felt  that the stent was indicated.  The bladder was drained with the cystoscope, she was taken down from lithotomy position, her anesthetic was reversed and she was moved recovery in stable condition.  There were no complications.  She will be sent home with her stone to bring to the office at follow-up.

## 2019-08-04 NOTE — H&P (Signed)
CC: I have ureteral stone.  HPI: Alexandra Cabrera is a 54 year-old female patient who was referred by Dr. Riki Sheer, MD who is here for ureteral stone.  The problem is on the right side. She first stated noticing pain on approximately 07/21/2019.   Alexandra Cabrera is a 54yo WF who is sent by Dr. Nani Ravens for a ureteral stone. She had a fever a couple of weeks ago with nausea and vomiting and UTI symptoms and cefdinir was sent in for a possible infected stone but she didn't get seen initially. She continued to have pain that was severe and she had a CT on 07/28/19 that showed a 78m right mid ureteral stone and a left renal stone. She continues to have renal colic but she has had no further fever. She is having urinary urgency and some diarrhea and mid lower abdominal pain. She has a history of stones and has had endoscopic management with stents. She has passed up to an 816mstone. She was given tamsulosin but has hypotension with it.      ALLERGIES: latex reglan    MEDICATIONS: Estradiol 2 mg tablet  Imitrex  Motrin Ib  Multivitamin  Topamax 100 mg tablet  Zofran     GU PSH: Hysterectomy     NON-GU PSH: Ankle Arthroscopy/surgery Appendectomy (laparoscopic) Excision Of Rectal Tumor, Transanal Approach; Not Including Muscularis Propria (Ie, Partial Thicknes - 05/03/2018 Hernia Repair Knee replacement     GU PMH: None   NON-GU PMH: Arthritis    FAMILY HISTORY: Heart Disease - Runs in Family Hypertension - Runs in Family   SOCIAL HISTORY: Marital Status: Married Preferred Language: English; Race: White Current Smoking Status: Patient has never smoked.   Tobacco Use Assessment Completed: Used Tobacco in last 30 days? Drinks 1 caffeinated drink per day. Patient's occupation isResearch officer, political party   REVIEW OF SYSTEMS:    GU Review Female:   Patient reports frequent urination and get up at night to urinate. Patient denies hard to postpone urination, burning /pain with urination, leakage of  urine, stream starts and stops, trouble starting your stream, have to strain to urinate, and being pregnant.  Gastrointestinal (Upper):   Patient reports nausea and vomiting. Patient denies indigestion/ heartburn.  Gastrointestinal (Lower):   Patient reports diarrhea. Patient denies constipation.  Constitutional:   Patient reports weight loss and fatigue. Patient denies fever and night sweats.  Skin:   Patient denies skin rash/ lesion and itching.  Eyes:   Patient reports blurred vision. Patient denies double vision.  Ears/ Nose/ Throat:   Patient denies sore throat and sinus problems.  Hematologic/Lymphatic:   Patient denies swollen glands and easy bruising.  Cardiovascular:   Patient denies leg swelling and chest pains.  Respiratory:   Patient denies cough and shortness of breath.  Endocrine:   Patient denies excessive thirst.  Musculoskeletal:   Patient reports back pain. Patient denies joint pain.  Neurological:   Patient reports dizziness. Patient denies headaches.  Psychologic:   Patient denies depression and anxiety.   VITAL SIGNS:      08/04/2019 09:54 AM  Weight 123 lb / 55.79 kg  Height 58 in / 147.32 cm  BP 118/75 mmHg  Pulse 80 /min  Temperature 97.5 F / 36.3 C  BMI 25.7 kg/m   MULTI-SYSTEM PHYSICAL EXAMINATION:    Constitutional: Well-nourished. No physical deformities. Normally developed. Good grooming.  Neck: Neck symmetrical, not swollen. Normal tracheal position.  Respiratory: Normal breath sounds. No labored breathing, no use of accessory  muscles.   Cardiovascular: Regular rate and rhythm. No murmur, no gallop. Normal temperature, normal extremity pulses, no swelling, no varicosities.   Lymphatic: No enlargement, no tenderness of supraclavicular, neck lymph nodes.  Skin: No paleness, no jaundice, no cyanosis. No lesion, no ulcer, no rash.  Neurologic / Psychiatric: Oriented to time, oriented to place, oriented to person. No depression, no anxiety, no agitation.   Gastrointestinal: No mass, no tenderness, no rigidity, non obese abdomen.  Musculoskeletal: Normal gait and station of head and neck.     PAST DATA REVIEWED:  Source Of History:  Patient  Urine Test Review:   Urinalysis  X-Ray Review: C.T. Stone Protocol: Reviewed Films. Reviewed Report. Discussed With Patient.     PROCEDURES:         KUB - [id number]  A single view of the abdomen is obtained. There is a 86m calcification in the right pelvis consistent with the stone seen on the prior CT. She had a 438mleft mid renal stone. No other bone, gas or soft tissue abnormalities are noted.       . Patient confirmed No Neulasta OnPro Device.           Urinalysis - 81003 Dipstick Dipstick Cont'd  Color: Yellow Bilirubin: Neg  Appearance: Clear Ketones: Neg  Specific Gravity: 1.020 Blood: Neg  pH: 6.0 Protein: Neg  Glucose: Neg Urobilinogen: 0.2    Nitrites: Neg    Leukocyte Esterase: Neg    Notes:      ASSESSMENT:      ICD-10 Details  1 GU:   Ureteral calculus - N20.1 Right, Undiagnosed New Problem - She has a symptomatic RUVJ stone and I reviewed the options of MET, URS and ESWL. She is not tolerating tamsulosin and will stop that and she would like to proceed with URS today. I have reviewed the risks of ureteroscopy including bleeding, infection, ureteral injury, need for a stent or secondary procedures, thrombotic events and anesthetic complications.    PLAN:           Orders X-Rays: KUB          Schedule Return Visit/Planned Activity: ASAP - Schedule Surgery  Procedure: 08/04/2019 - Cysto Uretero Lithotripsy - [id number]          Document Letter(s):  Created for Patient: Clinical Summary         Notes:   CC: Dr. NiRiki Sheer        Next Appointment:      Next Appointment: 08/12/2019 10:45 AM    Appointment Type: Postoperative Appointment    Location: [facility] - [phone]  Provider: BrAzucena FallenNP    Reason for Visit: POST OP

## 2019-08-04 NOTE — Anesthesia Procedure Notes (Signed)
Procedure Name: LMA Insertion Date/Time: 08/04/2019 4:27 PM Performed by: Montel Clock, CRNA Pre-anesthesia Checklist: Patient identified, Emergency Drugs available, Suction available, Patient being monitored and Timeout performed Patient Re-evaluated:Patient Re-evaluated prior to induction Oxygen Delivery Method: Circle system utilized Preoxygenation: Pre-oxygenation with 100% oxygen Induction Type: IV induction Ventilation: Mask ventilation without difficulty LMA: LMA inserted LMA Size: 4.0 Number of attempts: 1 Dental Injury: Teeth and Oropharynx as per pre-operative assessment

## 2019-08-04 NOTE — Discharge Instructions (Signed)
Ureteroscopy, Care After This sheet gives you information about how to care for yourself after your procedure. Your health care provider may also give you more specific instructions. If you have problems or questions, contact your health care provider. What can I expect after the procedure? After the procedure, it is common to have:  A burning sensation when you urinate.  Blood in your urine.  Mild discomfort in the bladder area or kidney area when urinating.  Needing to urinate more often or urgently. Follow these instructions at home:  Medicines  Take over-the-counter and prescription medicines only as told by your health care provider.  If you were prescribed an antibiotic medicine, take it as told by your health care provider. Do not stop taking the antibiotic even if you start to feel better. General instructions  Donot drive for 24 hours if you were given a medicine to help you relax (sedative) during your procedure.  To relieve burning, try taking a warm bath or holding a warm washcloth over your groin.  Drink enough fluid to keep your urine clear or pale yellow. ? Drink two 8-ounce glasses of water every hour for the first 2 hours after you get home. ? Continue to drink water often at home.  You can eat what you usually do.  Keep all follow-up visits as told by your health care provider. This is important. ? If you had a tube placed to keep urine flowing (ureteral stent), ask your health care provider when you need to return to have it removed. Contact a health care provider if:  You have chills or a fever.  You have burning pain for longer than 24 hours after the procedure.  You have blood in your urine for longer than 24 hours after the procedure. Get help right away if:  You have large amounts of blood in your urine.  You have blood clots in your urine.  You have very bad pain.  You have chest pain or trouble breathing.  You are unable to urinate and you  have the feeling of a full bladder.  I didn't leave a stent, but it is possible that you could have a ureteral spasm or clot passage with pain.  That is not unusual.  Please bring the stone to the office.  This information is not intended to replace advice given to you by your health care provider. Make sure you discuss any questions you have with your health care provider. Document Released: 07/22/2013 Document Revised: 06/29/2017 Document Reviewed: 04/28/2016 Elsevier Patient Education  2020 [employer].

## 2019-08-07 MED FILL — ONDANSETRON HCL 4 MG TABLET: 4 | 7 days supply | Qty: 20 | Fill #2

## 2019-08-14 DIAGNOSIS — N201 Calculus of ureter: Secondary | ICD-10-CM | POA: Diagnosis not present

## 2019-08-18 DIAGNOSIS — N201 Calculus of ureter: Secondary | ICD-10-CM | POA: Diagnosis not present

## 2019-09-04 ENCOUNTER — Other Ambulatory Visit: Payer: Self-pay | Admitting: Family Medicine

## 2019-09-04 MED FILL — SUMATRIPTAN SUCC 50 MG TAB: 50 | 30 days supply | Qty: 9 | Fill #1

## 2019-09-04 MED FILL — RIZATRIPTAN BENZOATE 10 MG: 10 | 30 days supply | Qty: 9 | Fill #0

## 2019-09-04 MED FILL — TOPIRAMATE 100 MG TABLET: 100 | 30 days supply | Qty: 60 | Fill #2

## 2019-09-04 MED FILL — ESTRADIOL 2 MG TABS: 2 | 30 days supply | Qty: 30 | Fill #2

## 2019-09-04 NOTE — Telephone Encounter (Signed)
Last OV 07/18/19 Last refill 10/24/18 #10/5 Next OV not scheduled

## 2019-10-02 ENCOUNTER — Other Ambulatory Visit: Payer: Self-pay | Admitting: Family Medicine

## 2019-10-02 MED FILL — TOPIRAMATE 100 MG TABLET: 100 | 30 days supply | Qty: 60 | Fill #3

## 2019-10-02 MED FILL — IBUPROFEN 800 MG TAB: 800 | 7 days supply | Qty: 30 | Fill #3

## 2019-10-02 MED FILL — ONDANSETRON HCL 4 MG TABLET: 4 | 7 days supply | Qty: 20 | Fill #0

## 2019-10-02 MED FILL — ESTRADIOL 2 MG TABS: 2 | 30 days supply | Qty: 30 | Fill #3

## 2019-10-21 MED FILL — SUMATRIPTAN SUCC 50 MG TAB: 50 | 30 days supply | Qty: 9 | Fill #2

## 2019-10-21 MED FILL — RIZATRIPTAN BENZOATE 10 MG: 10 | 30 days supply | Qty: 9 | Fill #1

## 2019-10-30 MED FILL — TOPIRAMATE 100 MG TABLET: 100 | 30 days supply | Qty: 60 | Fill #4

## 2019-10-30 MED FILL — ESTRADIOL 2 MG TABS: 2 | 30 days supply | Qty: 30 | Fill #4

## 2019-12-01 ENCOUNTER — Other Ambulatory Visit: Payer: Self-pay | Admitting: Family Medicine

## 2019-12-01 MED FILL — IBUPROFEN 800 MG TAB: 800 | 7 days supply | Qty: 30 | Fill #4

## 2019-12-01 MED FILL — TOPIRAMATE 100 MG TABLET: 100 | 30 days supply | Qty: 60 | Fill #5

## 2019-12-01 MED FILL — ESTRADIOL 2 MG TABS: 2 | 30 days supply | Qty: 30 | Fill #5

## 2019-12-01 MED FILL — ONDANSETRON HCL 4 MG TABLET: 4 | 7 days supply | Qty: 20 | Fill #1

## 2019-12-02 MED FILL — RIZATRIPTAN BENZOATE 10 MG: 10 | 30 days supply | Qty: 9 | Fill #0

## 2019-12-16 MED FILL — AMOXICILLIN 500 MG CAPSULE: 500 | 4 days supply | Qty: 16 | Fill #0

## 2019-12-22 ENCOUNTER — Encounter: Payer: 59 | Admitting: Family Medicine

## 2019-12-30 MED FILL — AMOXICILLIN 500 MG CAPSULE: 500 | 4 days supply | Qty: 16 | Fill #0

## 2020-01-06 ENCOUNTER — Ambulatory Visit (INDEPENDENT_AMBULATORY_CARE_PROVIDER_SITE_OTHER): Payer: 59 | Admitting: Family Medicine

## 2020-01-06 ENCOUNTER — Other Ambulatory Visit: Payer: Self-pay | Admitting: Family Medicine

## 2020-01-06 ENCOUNTER — Other Ambulatory Visit: Payer: Self-pay

## 2020-01-06 ENCOUNTER — Encounter: Payer: Self-pay | Admitting: Family Medicine

## 2020-01-06 VITALS — BP 106/62 | HR 75 | Temp 95.1°F | Ht 59.5 in | Wt 123.0 lb

## 2020-01-06 DIAGNOSIS — Z0000 Encounter for general adult medical examination without abnormal findings: Secondary | ICD-10-CM | POA: Diagnosis not present

## 2020-01-06 LAB — LIPID PANEL
Cholesterol: 192 mg/dL (ref 0–200)
HDL: 63.6 mg/dL (ref >39.00)
LDL Cholesterol: 114 mg/dL — ABNORMAL HIGH (ref 0–99)
NonHDL: 128.51
Total CHOL/HDL Ratio: 3
Triglycerides: 74 mg/dL (ref 0.0–149.0)
VLDL: 14.8 mg/dL (ref 0.0–40.0)

## 2020-01-06 LAB — CBC
HCT: 39.2 % (ref 36.0–46.0)
Hemoglobin: 13.5 g/dL (ref 12.0–15.0)
MCHC: 34.3 g/dL (ref 30.0–36.0)
MCV: 94.2 fl (ref 78.0–100.0)
Platelets: 263 10*3/uL (ref 150.0–400.0)
RBC: 4.17 Mil/uL (ref 3.87–5.11)
RDW: 12.9 % (ref 11.5–15.5)
WBC: 6.2 10*3/uL (ref 4.0–10.5)

## 2020-01-06 LAB — COMPREHENSIVE METABOLIC PANEL
ALT: 16 U/L (ref 0–35)
AST: 19 U/L (ref 0–37)
Albumin: 4.1 g/dL (ref 3.5–5.2)
Alkaline Phosphatase: 60 U/L (ref 39–117)
BUN: 27 mg/dL — ABNORMAL HIGH (ref 6–23)
CO2: 25 mEq/L (ref 19–32)
Calcium: 9 mg/dL (ref 8.4–10.5)
Chloride: 108 mEq/L (ref 96–112)
Creatinine, Ser: 0.83 mg/dL (ref 0.40–1.20)
GFR: 71.36 mL/min (ref >60.00)
Glucose, Bld: 94 mg/dL (ref 70–99)
Potassium: 3.7 mEq/L (ref 3.5–5.1)
Sodium: 138 mEq/L (ref 135–145)
Total Bilirubin: 0.3 mg/dL (ref 0.2–1.2)
Total Protein: 6.1 g/dL (ref 6.0–8.3)

## 2020-01-06 MED ORDER — TOPIRAMATE 100 MG PO TABS: 100.0000 mg | ORAL_TABLET | Freq: Two times a day (BID) | ORAL | 5 refills | Status: DC

## 2020-01-06 MED ORDER — LEVOCETIRIZINE DIHYDROCHLORIDE 5 MG PO TABS: 5.0000 mg | ORAL_TABLET | Freq: Every evening | ORAL | 1 refills | Status: DC

## 2020-01-06 NOTE — Patient Instructions (Addendum)
Give [place] 2-3 business days to get the results of your labs back.   Keep the diet clean and stay active.  The new Shingrix vaccine (for shingles) is a 2 shot series. It can make people feel low energy, achy and almost like they have the flu for 48 hours after injection. Please plan accordingly when deciding on when to get this shot. Call our office for a nurse visit appointment to get this. The second shot of the series is less severe regarding the side effects, but it still lasts 48 hours.   Call Dr. Lyndel Safe for follow up colonoscopy as soon as able.   Let us know if you need anything.

## 2020-01-06 NOTE — Progress Notes (Signed)
Chief Complaint  Patient presents with  . Annual Exam     Well Woman Alexandra Cabrera is here for a complete physical.   Her last physical was >1 year ago.  Current diet: in general, diet has been poor lately over past several mo due to family health issues. 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 Weight is stable and she denies fatigue. Seatbelt? Yes  Health Maintenance Pap/HPV- N/A Mammogram- Yes Colon cancer screening-Yes - following with gen surg and GI Shingrix- No Tetanus- Yes HIV screening- Yes  Past Medical History:  Diagnosis Date  . Acne 11/01/2015  . Allergy   . Arthritis   . Asthma    childhood and rare as adult  . Blood transfusion without reported diagnosis   . Diverticulosis 02/04/2018   Mild sigmoid, noted on Colonoscopy  . Dyspareunia, female   . Family history of adverse reaction to anesthesia    " my sisiter has had Malignant hypertheria twice, I have never had that problem"; (patient had sevo and succinylcholine with no apparent complication on 62/94/76 at [facility])  . Fibula fracture 07/23/2018   Distal fibular fracture  . GERD (gastroesophageal reflux disease)   . Hemorrhoids 02/04/2018   noted on Colonoscopy  . History of appendicitis   . History of colon polyps 02/04/2018  . History of gallstones   . History of kidney stones   . Hyperlipidemia, mixed 05/17/2016   Patient denies  . IBS (irritable bowel syndrome) 01/03/2015  . IC (interstitial cystitis)   . Migraines   . Pelvic pain   . PONV (postoperative nausea and vomiting)    "difficult to wake up."  . Preventative health care 01/03/2015  . Rash and nonspecific skin eruption 11/01/2015  . Seasonal allergies      Past Surgical History:  Procedure Laterality Date  . ABDOMINAL HYSTERECTOMY    . APPENDECTOMY    . CHOLECYSTECTOMY N/A 10/12/2014   Procedure: LAPAROSCOPIC CHOLECYSTECTOMY WITH INTRAOPERATIVE CHOLANGIOGRAM;  Surgeon: Doreen Salvage, MD;  Location: [place];  Service: General;  Laterality: N/A;  . COLONOSCOPY  02/04/2018  . CYSTOSCOPY WITH RETROGRADE PYELOGRAM, URETEROSCOPY AND STENT PLACEMENT Right 08/04/2019   Procedure: CYSTOSCOPY WITH RETROGRADE PYELOGRAM, RIGHT URETEROSCOPY, BASKET STONE EXTRACTION;  Surgeon: Irine Seal, MD;  Location: [facility];  Service: Urology;  Laterality: Right;  . HEMORROIDECTOMY     young adult  . ORIF ANKLE FRACTURE Left 07/30/2018   Procedure: OPEN REDUCTION INTERNAL FIXATION (ORIF) LEFT ANKLE FRACTURE;  Surgeon: Renette Butters, MD;  Location: [place];  Service: Orthopedics;  Laterality: Left;  . PARTIAL PROCTECTOMY BY TEM N/A 05/29/2018   Procedure: PARTIAL PROCTECTOMY BY TEM EXCISION OF ANAL TAG;  Surgeon: Michael Boston, MD;  Location: [facility];  Service: General;  Laterality: N/A;  . TOTAL KNEE ARTHROPLASTY     1 knee replacements, 7 knee revision, 28 surgeries on right knee    Medications  Current Outpatient Medications on File Prior to Visit  Medication Sig Dispense Refill  . amoxicillin (AMOXIL) 500 MG capsule Take 2,000 mg by mouth See admin instructions. Take 2000 mg by mouth 1 hour prior to dental appointment  2  . cetirizine (ZYRTEC) 10 MG tablet Take 1 tablet (10 mg total) by mouth daily. (Patient taking differently: Take 10 mg by mouth daily as needed for allergies. ) 30 tablet 1  . estradiol (ESTRACE) 2 MG tablet TAKE 1 TABLET (2 MG TOTAL) BY MOUTH DAILY. 30 tablet 5  . ibuprofen (ADVIL)  800 MG tablet TAKE 1 TABLET BY MOUTH AT ONSET OF MIGRAINE MAY REPEAT IN 6 HOURS AS NEEDED 30 tablet 5  . montelukast (SINGULAIR) 10 MG tablet Take 1 tablet (10 mg total) by mouth at bedtime. (Patient taking differently: Take 10 mg by mouth daily as needed (for allergies). ) 30 tablet 3  . Multiple Vitamin (MULTIVITAMIN WITH MINERALS) TABS tablet Take 1 tablet by mouth daily.    . ondansetron (ZOFRAN) 4 MG tablet TAKE 1 TABLET BY MOUTH EVERY 8 HOURS AS NEEDED FOR NAUSEA 20 tablet 2  . rizatriptan  (MAXALT) 10 MG tablet TAKE 1 TABLET BY MOUTH DAILY AS NEEDED FOR MIGRAINE. MAY REPEAT IN 2 HOURS AS NEEDED 9 tablet 2  . SUMAtriptan (IMITREX) 50 MG tablet TAKE 1 TABLET BY MOUTH AT HEADACHE ONSET. MAY REPEAT IN 2 HOURS ONE TIME ONLY 9 tablet 5  . topiramate (TOPAMAX) 100 MG tablet TAKE 1 TABLET (100 MG TOTAL) BY MOUTH 2 (TWO) TIMES DAILY. 60 tablet 5    Allergies Allergies  Allergen Reactions  . Latex Anaphylaxis  . Phenergan [Promethazine Hcl] Other (See Comments)    Shaky, tremors  . Reglan [Metoclopramide] Anxiety    Review of Systems: Constitutional:  no unexpected weight changes Eye:  no recent significant change in vision Ear/Nose/Mouth/Throat:  Ears:  no recent change in hearing Nose/Mouth/Throat:  no complaints of nasal congestion, no sore throat Cardiovascular: no chest pain Respiratory:  no shortness of breath Gastrointestinal:  no abdominal pain, no change in bowel habits GU:  Female: negative for dysuria or pelvic pain Musculoskeletal/Extremities:  no pain of the joints Integumentary (Skin/Breast):  no abnormal skin lesions reported Neurologic:  no headaches Endocrine:  denies fatigue Hematologic/Lymphatic:  No areas of easy bleeding  Exam BP 106/62 (BP Location: Left Arm, Patient Position: Sitting, Cuff Size: Normal)   Pulse 75   Temp (!) 95.1 F (35.1 C) (Temporal)   Ht 4' 11.5" (1.511 m)   Wt 123 lb (55.8 kg)   SpO2 100%   BMI 24.43 kg/m  General:  well developed, well nourished, in no apparent distress Skin:  no significant moles, warts, or growths Head:  no masses, lesions, or tenderness Eyes:  pupils equal and round, sclera anicteric without injection Ears:  canals without lesions, TMs shiny without retraction, no obvious effusion, no erythema Nose:  nares patent, septum midline, mucosa normal, and no drainage or sinus tenderness Throat/Pharynx:  lips and gingiva without lesion; tongue and uvula midline; non-inflamed pharynx; no exudates or postnasal  drainage Neck: neck supple without adenopathy, thyromegaly, or masses Lungs:  clear to auscultation, breath sounds equal bilaterally, no respiratory distress Cardio:  regular rate and rhythm, no LE edema Abdomen:  abdomen soft, nontender; bowel sounds normal; no masses or organomegaly Genital: Defer to GYN Musculoskeletal:  symmetrical muscle groups noted without atrophy or deformity Extremities:  no clubbing, cyanosis, or edema, no deformities, no skin discoloration Neuro:  gait normal; deep tendon reflexes normal and symmetric Psych: well oriented with normal range of affect and appropriate judgment/insight  Assessment and Plan  Well adult exam - Plan: CBC, Comprehensive metabolic panel, Lipid panel   Well 54 y.o. female. Counseled on diet and exercise. Shingrix rec'd.  F/u w GI rec'd.  Other orders as above. Follow up in 6 mo for med ck or prn. The patient voiced understanding and agreement to the plan.  [place], DO 01/06/20 7:17 AM

## 2020-01-08 ENCOUNTER — Other Ambulatory Visit: Payer: Self-pay | Admitting: Family Medicine

## 2020-01-11 ENCOUNTER — Emergency Department (HOSPITAL_BASED_OUTPATIENT_CLINIC_OR_DEPARTMENT_OTHER)
Admission: EM | Admit: 2020-01-11 | Discharge: 2020-01-11 | Disposition: A | Discharge status: Home / Self Care | Payer: 59 | Attending: Emergency Medicine | Admitting: Emergency Medicine

## 2020-01-11 ENCOUNTER — Encounter (HOSPITAL_BASED_OUTPATIENT_CLINIC_OR_DEPARTMENT_OTHER): Payer: Self-pay | Admitting: Emergency Medicine

## 2020-01-11 ENCOUNTER — Emergency Department (HOSPITAL_BASED_OUTPATIENT_CLINIC_OR_DEPARTMENT_OTHER): Payer: 59

## 2020-01-11 DIAGNOSIS — Y999 Unspecified external cause status: Secondary | ICD-10-CM | POA: Insufficient documentation

## 2020-01-11 DIAGNOSIS — S0185XA Open bite of other part of head, initial encounter: Secondary | ICD-10-CM

## 2020-01-11 DIAGNOSIS — S0181XA Laceration without foreign body of other part of head, initial encounter: Secondary | ICD-10-CM | POA: Insufficient documentation

## 2020-01-11 DIAGNOSIS — Z79818 Long term (current) use of other agents affecting estrogen receptors and estrogen levels: Secondary | ICD-10-CM | POA: Diagnosis not present

## 2020-01-11 DIAGNOSIS — W540XXA Bitten by dog, initial encounter: Secondary | ICD-10-CM | POA: Insufficient documentation

## 2020-01-11 DIAGNOSIS — J45909 Unspecified asthma, uncomplicated: Secondary | ICD-10-CM | POA: Insufficient documentation

## 2020-01-11 DIAGNOSIS — Z96651 Presence of right artificial knee joint: Secondary | ICD-10-CM | POA: Insufficient documentation

## 2020-01-11 DIAGNOSIS — Y9389 Activity, other specified: Secondary | ICD-10-CM | POA: Insufficient documentation

## 2020-01-11 DIAGNOSIS — Z91040 Latex allergy status: Secondary | ICD-10-CM | POA: Diagnosis not present

## 2020-01-11 DIAGNOSIS — Y9289 Other specified places as the place of occurrence of the external cause: Secondary | ICD-10-CM | POA: Diagnosis not present

## 2020-01-11 MED ORDER — AMOXICILLIN-POT CLAVULANATE 875-125 MG PO TABS: 1.0000 | ORAL_TABLET | Freq: Two times a day (BID) | ORAL | 0 refills | Status: DC

## 2020-01-11 MED ORDER — LIDOCAINE-EPINEPHRINE-TETRACAINE (LET) TOPICAL GEL
6.0000 mL | Freq: Once | TOPICAL | Status: AC
  Administered 2020-01-11: 6 mL via TOPICAL
  Filled 2020-01-11: qty 6

## 2020-01-11 MED ORDER — LIDOCAINE-EPINEPHRINE (PF) 2 %-1:200000 IJ SOLN: 10.0000 mL | Freq: Once | INTRAMUSCULAR | Status: AC

## 2020-01-11 MED ORDER — LIDOCAINE-EPINEPHRINE (PF) 2 %-1:200000 IJ SOLN
INTRAMUSCULAR | Status: AC
  Administered 2020-01-11: 10 mL via INTRADERMAL
  Filled 2020-01-11: qty 10

## 2020-01-11 MED ORDER — OXYCODONE-ACETAMINOPHEN 5-325 MG PO TABS
1.0000 | ORAL_TABLET | ORAL | Status: DC | PRN
  Administered 2020-01-11: 1 via ORAL
  Filled 2020-01-11: qty 1

## 2020-01-11 MED ORDER — AMOXICILLIN-POT CLAVULANATE 875-125 MG PO TABS
1.0000 | ORAL_TABLET | Freq: Once | ORAL | Status: AC
  Administered 2020-01-11: 1 via ORAL
  Filled 2020-01-11: qty 1

## 2020-01-11 NOTE — Discharge Instructions (Signed)
Take antibiotics to help prevent infection.  Monitor closely for redness, swelling, increasing drainage or worsening pain.  Take ibuprofen and Tylenol as needed for pain.  Sutures are dissolvable and will come out on their own after about a week

## 2020-01-11 NOTE — ED Provider Notes (Signed)
[facility] EMERGENCY DEPARTMENT Provider Note   CSN: [id number] Arrival date & time: 01/11/20  1655     History Chief Complaint  Patient presents with  . Animal Bite    Alexandra Cabrera is a 54 y.o. female.  Alexandra Cabrera is a 54 y.o. female with a hx of asthma, IBS, arthritis, GERD, hyperlipidemia, migraines, who presents for evaluation of dog bite to the face. This occurred just a few hours prior to arrival. Pt states that she was with a new rescue dog at her mother's house and had some food in her hand and went she bent forward the dog lunch and bit her chin. Pt states she had to pry the dog's mouth open to get it off. She reports if felt like the dog crunched down on her chin and she had had severe pain since then. Bleeding is controlled. No wounds inside the mouth, but pt reports pain with movement of lower jaw. No bite wounds elsewhere. Tetanus vaccine UTD. No other aggravating or alleviating factors.        Past Medical History:  Diagnosis Date  . Acne 11/01/2015  . Allergy   . Arthritis   . Asthma    childhood and rare as adult  . Blood transfusion without reported diagnosis   . Diverticulosis 02/04/2018   Mild sigmoid, noted on Colonoscopy  . Dyspareunia, female   . Family history of adverse reaction to anesthesia    " my sisiter has had Malignant hypertheria twice, I have never had that problem"; (patient had sevo and succinylcholine with no apparent complication on 43/15/40 at [facility])  . Fibula fracture 07/23/2018   Distal fibular fracture  . GERD (gastroesophageal reflux disease)   . Hemorrhoids 02/04/2018   noted on Colonoscopy  . History of appendicitis   . History of colon polyps 02/04/2018  . History of gallstones   . History of kidney stones   . Hyperlipidemia, mixed 05/17/2016   Patient denies  . IBS (irritable bowel syndrome) 01/03/2015  . IC (interstitial cystitis)   . Migraines   . Pelvic pain   . PONV (postoperative nausea  and vomiting)    "difficult to wake up."  . Preventative health care 01/03/2015  . Rash and nonspecific skin eruption 11/01/2015  . Seasonal allergies     Patient Active Problem List   Diagnosis Date Noted  . 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 Seasonal allergies 01/07/2018  . Right leg pain 03/19/2017  . Hyperlipidemia, mixed 05/17/2016  . Acne 11/01/2015  . Rash and nonspecific skin eruption 11/01/2015  . IBS (irritable bowel syndrome) 01/03/2015  . Preventative health care 01/03/2015  . S/P hysterectomy 09/09/2012  . Menopause 09/09/2012  . Hot flushes, perimenopausal 09/09/2012  . Family history of breast cancer in female 09/09/2012  . Chronic interstitial cystitis 08/10/2011  . Migraine 09/24/2007  . ENDOMETRIOSIS 09/24/2007  . ARTHRITIS 09/24/2007  . NEPHROLITHIASIS, HX OF 09/24/2007    Past Surgical History:  Procedure Laterality Date  . ABDOMINAL HYSTERECTOMY    . APPENDECTOMY    . CHOLECYSTECTOMY N/A 10/12/2014   Procedure: LAPAROSCOPIC CHOLECYSTECTOMY WITH INTRAOPERATIVE CHOLANGIOGRAM;  Surgeon: Doreen Salvage, MD;  Location: [facility];  Service: General;  Laterality: N/A;  . COLONOSCOPY  02/04/2018  . CYSTOSCOPY WITH RETROGRADE PYELOGRAM, URETEROSCOPY AND STENT PLACEMENT Right 08/04/2019   Procedure: CYSTOSCOPY WITH RETROGRADE PYELOGRAM, RIGHT URETEROSCOPY, BASKET STONE EXTRACTION;  Surgeon: Irine Seal, MD;  Location: [facility];  Service: Urology;  Laterality: Right;  . HEMORROIDECTOMY     young adult  . ORIF ANKLE FRACTURE Left 07/30/2018   Procedure: OPEN REDUCTION INTERNAL FIXATION (ORIF) LEFT ANKLE FRACTURE;  Surgeon: Renette Butters, MD;  Location: [facility];  Service: Orthopedics;  Laterality: Left;  . PARTIAL PROCTECTOMY BY TEM N/A 05/29/2018   Procedure: PARTIAL PROCTECTOMY BY TEM EXCISION OF ANAL TAG;  Surgeon: Michael Boston, MD;  Location: [facility];  Service: General;  Laterality: N/A;  . TOTAL KNEE ARTHROPLASTY      1 knee replacements, 7 knee revision, 28 surgeries on right knee     OB History   No obstetric history on file.     Family History  Problem Relation Age of Onset  . Diabetes Mother   . Heart disease Mother        MI  . Arthritis Father   . COPD Father        smoker  . Hypertension Father   . Hyperlipidemia Father   . Arthritis Sister   . Arthritis Brother   . Cancer Maternal Grandmother 45       ovarian  . Arthritis Paternal Grandmother   . Heart disease Paternal Grandfather        MI  . Colon cancer Neg Hx   . Esophageal cancer Neg Hx   . Liver cancer Neg Hx   . Pancreatic cancer Neg Hx   . Rectal cancer Neg Hx   . Stomach cancer Neg Hx     Social History   Tobacco Use  . Smoking status: Never Smoker  . Smokeless tobacco: Never Used  Vaping Use  . Vaping Use: Never used  Substance Use Topics  . Alcohol use: No  . Drug use: No    Home Medications Prior to Admission medications   Medication Sig Start Date End Date Taking? Authorizing Provider  amoxicillin (AMOXIL) 500 MG capsule Take 2,000 mg by mouth See admin instructions. Take 2000 mg by mouth 1 hour prior to dental appointment 02/20/18   [provider]  estradiol (ESTRACE) 2 MG tablet TAKE 1 TABLET (2 MG TOTAL) BY MOUTH DAILY. 01/08/20   Mosie Lukes, MD  ibuprofen (ADVIL) 800 MG tablet TAKE 1 TABLET BY MOUTH AT ONSET OF MIGRAINE MAY REPEAT IN 6 HOURS AS NEEDED 01/13/19   Mosie Lukes, MD  levocetirizine (XYZAL) 5 MG tablet Take 1 tablet (5 mg total) by mouth every evening. 01/06/20   Shelda Pal, DO  Multiple Vitamin (MULTIVITAMIN WITH MINERALS) TABS tablet Take 1 tablet by mouth daily.    [provider]  ondansetron (ZOFRAN) 4 MG tablet TAKE 1 TABLET BY MOUTH EVERY 8 HOURS AS NEEDED FOR NAUSEA 10/02/19   Mosie Lukes, MD  rizatriptan (MAXALT) 10 MG tablet TAKE 1 TABLET BY MOUTH DAILY AS NEEDED FOR MIGRAINE. MAY REPEAT IN 2 HOURS AS NEEDED 12/02/19   Mosie Lukes, MD    SUMAtriptan (IMITREX) 50 MG tablet TAKE 1 TABLET BY MOUTH AT HEADACHE ONSET. MAY REPEAT IN 2 HOURS ONE TIME ONLY 07/29/19   Mosie Lukes, MD  topiramate (TOPAMAX) 100 MG tablet Take 1 tablet (100 mg total) by mouth 2 (two) times daily. 01/06/20   Shelda Pal, DO    Allergies    Latex, Phenergan [promethazine hcl], and Reglan [metoclopramide]  Review of Systems   Review of Systems  Constitutional: Negative for chills and fever.  HENT: Positive for facial swelling. Negative for dental problem.   Skin:  Positive for wound.  All other systems reviewed and are negative.   Physical Exam Updated Vital Signs BP (!) 155/75 (BP Location: Right Arm)   Pulse 67   Temp 97.8 F (36.6 C) (Oral)   Resp 18   SpO2 100%   Physical Exam Vitals and nursing note reviewed.  Constitutional:      General: She is not in acute distress.    Appearance: Normal appearance. She is well-developed and normal weight. She is not ill-appearing or diaphoretic.  HENT:     Head: Normocephalic and atraumatic.     Jaw: Tenderness present.     Comments: Multiple bite wounds present over the underside of the chin on the right side there is a 3 cm linear wound and on the left side there is a 2 cm wound, and adjacent 1 cm with an a few smaller puncture wounds.  Bleeding is controlled.  There is tenderness and swelling over the jaw, pain worse with movement    Mouth/Throat:     Comments: No wounds to the oral mucosa, no tenderness to palpation over the teeth. Eyes:     General:        Right eye: No discharge.        Left eye: No discharge.  Neck:     Comments: No bite wounds to the anterior neck, no stridor or crepitus Pulmonary:     Effort: Pulmonary effort is normal. No respiratory distress.  Musculoskeletal:        General: No deformity.     Cervical back: Neck supple.  Skin:    General: Skin is warm and dry.  Neurological:     Mental Status: She is alert and oriented to person, place, and time.      Coordination: Coordination normal.  Psychiatric:        Mood and Affect: Mood normal.        Behavior: Behavior normal.     ED Results / Procedures / Treatments   Labs (all labs ordered are listed, but only abnormal results are displayed) Labs Reviewed - No data to display  EKG None  Radiology CT Maxillofacial Wo Contrast  Result Date: 01/11/2020 CLINICAL DATA:  Recent dog bite to the chin, initial encounter EXAM: CT MAXILLOFACIAL WITHOUT CONTRAST TECHNIQUE: Multidetector CT imaging of the maxillofacial structures was performed. Multiplanar CT image reconstructions were also generated. COMPARISON:  None. FINDINGS: Osseous: No acute fracture is identified. Orbits: Orbits and their contents are within normal limits. Sinuses: Paranasal sinuses are well visualized. Soft tissues: Soft tissue changes are noted surrounding the anterior aspect of the mandible with considerable subcutaneous emphysema identified consistent with the recent injury. Several puncture wounds are noted within the skin adjacent to the mandible. No underlying foreign body is seen. Limited intracranial: No significant or unexpected finding. IMPRESSION: Changes consistent with the recent dog bite anterior to mandible with subcutaneous emphysema. No hematoma or retained foreign body is noted. No fracture is seen. Electronically Signed   By: Inez Catalina M.D.   On: 01/11/2020 18:16     Procedures .Marland KitchenLaceration Repair  Date/Time: 01/11/2020 7:03 PM Performed by: Jacqlyn Larsen, PA-C Authorized by: Jacqlyn Larsen, PA-C   Consent:    Consent obtained:  Verbal   Consent given by:  Patient   Risks discussed:  Infection, pain, poor cosmetic result, poor wound healing, need for additional repair and nerve damage   Alternatives discussed:  No treatment Anesthesia (see MAR for exact dosages):    Anesthesia method:  Local infiltration and topical application   Topical anesthetic:  LET   Local anesthetic:  Lidocaine 2% WITH  epi Laceration details:    Location:  Face   Face location:  Chin   Length (cm):  3   Depth (mm):  3 Repair type:    Repair type:  Simple Pre-procedure details:    Preparation:  Patient was prepped and draped in usual sterile fashion and imaging obtained to evaluate for foreign bodies Exploration:    Hemostasis achieved with:  Direct pressure, epinephrine and LET   Wound exploration: entire depth of wound probed and visualized     Wound extent: areolar tissue violated   Treatment:    Area cleansed with:  Saline   Amount of cleaning:  Extensive   Irrigation solution:  Sterile saline   Irrigation volume:  500 cc   Irrigation method:  Pressure wash Skin repair:    Repair method:  Sutures   Suture size:  5-0   Suture material:  Fast-absorbing gut   Suture technique:  Simple interrupted   Number of sutures:  5 Approximation:    Approximation:  Close Post-procedure details:    Dressing:  Open (no dressing)   Patient tolerance of procedure:  Tolerated well, no immediate complications .Marland KitchenLaceration Repair  Date/Time: 01/11/2020 7:04 PM Performed by: Jacqlyn Larsen, PA-C Authorized by: Jacqlyn Larsen, PA-C   Consent:    Consent obtained:  Verbal   Consent given by:  Patient   Risks discussed:  Infection, pain, poor cosmetic result, poor wound healing, need for additional repair and nerve damage   Alternatives discussed:  No treatment Anesthesia (see MAR for exact dosages):    Anesthesia method:  Local infiltration and topical application   Topical anesthetic:  LET   Local anesthetic:  Lidocaine 2% WITH epi Laceration details:    Location:  Face   Face location:  Chin   Length (cm):  2   Depth (mm):  3 Repair type:    Repair type:  Simple Pre-procedure details:    Preparation:  Patient was prepped and draped in usual sterile fashion and imaging obtained to evaluate for foreign bodies Exploration:    Hemostasis achieved with:  Direct pressure, epinephrine and LET   Wound  exploration: entire depth of wound probed and visualized     Wound extent: areolar tissue violated   Treatment:    Area cleansed with:  Saline   Amount of cleaning:  Extensive   Irrigation solution:  Sterile saline   Irrigation volume:  500 cc   Irrigation method:  Pressure wash Skin repair:    Repair method:  Sutures   Suture size:  5-0   Suture material:  Fast-absorbing gut   Suture technique:  Simple interrupted   Number of sutures:  3 Approximation:    Approximation:  Close Post-procedure details:    Dressing:  Open (no dressing)   Patient tolerance of procedure:  Tolerated well, no immediate complications .Marland KitchenLaceration Repair  Date/Time: 01/11/2020 7:05 PM Performed by: Jacqlyn Larsen, PA-C Authorized by: Jacqlyn Larsen, PA-C   Consent:    Consent obtained:  Verbal   Consent given by:  Patient   Risks discussed:  Infection, pain, poor cosmetic result, poor wound healing and need for additional repair   Alternatives discussed:  No treatment Anesthesia (see MAR for exact dosages):    Anesthesia method:  Local infiltration and topical application   Topical anesthetic:  LET   Local anesthetic:  Lidocaine 2%  WITH epi Laceration details:    Location:  Face   Face location:  Chin   Length (cm):  1   Depth (mm):  3 Repair type:    Repair type:  Simple Pre-procedure details:    Preparation:  Patient was prepped and draped in usual sterile fashion and imaging obtained to evaluate for foreign bodies Exploration:    Hemostasis achieved with:  Direct pressure, LET and epinephrine   Wound exploration: entire depth of wound probed and visualized     Wound extent: areolar tissue violated   Treatment:    Area cleansed with:  Saline   Amount of cleaning:  Extensive   Irrigation solution:  Sterile saline   Irrigation volume:  500 cc   Irrigation method:  Pressure wash Skin repair:    Repair method:  Sutures   Suture size:  5-0   Suture material:  Fast-absorbing gut   Suture  technique:  Simple interrupted   Number of sutures:  1 Approximation:    Approximation:  Close Post-procedure details:    Dressing:  Open (no dressing)   Patient tolerance of procedure:  Tolerated well, no immediate complications   (including critical care time)  Medications Ordered in ED Medications  oxyCODONE-acetaminophen (PERCOCET/ROXICET) 5-325 MG per tablet 1 tablet (1 tablet Oral Given 01/11/20 1708)  lidocaine-EPINEPHrine-tetracaine (LET) topical gel (6 mLs Topical Given 01/11/20 1741)  lidocaine-EPINEPHrine (XYLOCAINE W/EPI) 2 %-1:200000 (PF) injection 10 mL (10 mLs Intradermal Given by Other 01/11/20 1749)    ED Course  I have reviewed the triage vital signs and the nursing notes.  Pertinent labs & imaging results that were available during my care of the patient were reviewed by me and considered in my medical decision making (see chart for details).    MDM Rules/Calculators/A&P                          Patient presents after dog bite.  This was a dog she recently rescued, up-to-date on all vaccinations, so no indication for rabies vaccination series today.  Patient's tetanus vaccine is also up-to-date.  Bite wounds present over the chin, tenderness to the jaw and pain with movement so CT ordered which shows no evidence of fracture, foreign body or hematoma, evidence of wounds with small amount of subcu emphysema as expected with multiple open wounds of the soft tissue.  The 3 larger wounds were closed with simple interrupted sutures after copious irrigation. Wounds closed with hemostasis, and good cosmesis.  Will prescribe Augmentin for infection prophylaxis.  Discussed signs of infection that should warrant sooner return.  Patient expresses understanding and agreement.  Discharged home in good condition.  Final Clinical Impression(s) / ED Diagnoses Final diagnoses:  Dog bite of face, initial encounter  Facial laceration, initial encounter    Rx / DC Orders ED Discharge  Orders         Ordered    amoxicillin-clavulanate (AUGMENTIN) 875-125 MG tablet  2 times daily     Discontinue  Reprint     01/11/20 1901           Janet Berlin 01/13/20 2123    Margette Fast, MD 01/14/20 [phone]

## 2020-01-11 NOTE — ED Triage Notes (Signed)
Pt bit on chin by her dog. Vaccines are UTD

## 2020-01-12 MED FILL — AMOX-CLAV 875-125 MG TABLET: 875-125 | 7 days supply | Qty: 14 | Fill #0

## 2020-02-04 MED FILL — TOPIRAMATE 100 MG TABLET: 100 | 30 days supply | Qty: 60 | Fill #1

## 2020-02-04 MED FILL — ONDANSETRON HCL 4 MG TABLET: 4 | 7 days supply | Qty: 20 | Fill #2

## 2020-02-17 ENCOUNTER — Other Ambulatory Visit: Payer: Self-pay

## 2020-02-17 ENCOUNTER — Ambulatory Visit: Payer: 59 | Admitting: Plastic Surgery

## 2020-02-17 ENCOUNTER — Encounter: Payer: Self-pay | Admitting: Plastic Surgery

## 2020-02-17 DIAGNOSIS — Z719 Counseling, unspecified: Secondary | ICD-10-CM

## 2020-02-17 DIAGNOSIS — S0185XA Open bite of other part of head, initial encounter: Secondary | ICD-10-CM | POA: Diagnosis not present

## 2020-02-17 DIAGNOSIS — W540XXA Bitten by dog, initial encounter: Secondary | ICD-10-CM | POA: Diagnosis not present

## 2020-02-17 NOTE — Progress Notes (Signed)
Patient ID: Alexandra Cabrera, female    DOB: [birth year], 54 y.o.   MRN: [id number]   Chief Complaint  Patient presents with   Consult    The patient is a 54 year old female here for evaluation of her face.  1 month ago the patient rescued a neighborhood dog.  In an unprovoked attack the dog lunged at her and bit her chin.  She was seen in the emergency room and repaired.  They repair seems to have been very nicely done.  She has some firm areas where the scar is.  She has not done anything yet for treatment.  She is really good about staying out of the sun.  She is otherwise in good health.  She did have a dog bite in the past on the right nasolabial fold that has healed very nicely.  There is no sign of infection.  There is no sign of keloid scarring at this time.  She has a slight weakness of the left mentalis muscle but nothing obvious.  She does have some numbness in the chin area.  I do not see any sign of foreign body.  She has quite a bit of sun damage to her face and neck area with hypopigmentation.  She is a Marine scientist at [employer].   Review of Systems  Constitutional: Negative for activity change and appetite change.  Respiratory: Negative.  Negative for chest tightness.   Cardiovascular: Negative.   Gastrointestinal: Negative.   Genitourinary: Negative.   Musculoskeletal: Negative.   Skin: Positive for color change and wound.  Psychiatric/Behavioral: Negative.     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, I have never had that problem"; (patient had sevo and succinylcholine with no apparent complication on 53/61/44 at [facility])    Fibula fracture 07/23/2018   Distal fibular fracture   GERD (gastroesophageal reflux disease)    Hemorrhoids 02/04/2018   noted on Colonoscopy   History of appendicitis    History of colon polyps 02/04/2018   History of gallstones    History of kidney stones    Hyperlipidemia, mixed 05/17/2016   Patient denies   IBS (irritable bowel syndrome) 01/03/2015   IC (interstitial cystitis)    Migraines    Pelvic pain    PONV (postoperative nausea and vomiting)    "difficult to wake up."   Preventative health care 01/03/2015   Rash and nonspecific skin eruption 11/01/2015   Seasonal allergies     Past Surgical History:  Procedure Laterality Date   ABDOMINAL HYSTERECTOMY     APPENDECTOMY     CHOLECYSTECTOMY N/A 10/12/2014   Procedure: LAPAROSCOPIC CHOLECYSTECTOMY WITH INTRAOPERATIVE CHOLANGIOGRAM;  Surgeon: Doreen Salvage, MD;  Location: [facility];  Service: General;  Laterality: N/A;   COLONOSCOPY  02/04/2018   CYSTOSCOPY WITH RETROGRADE PYELOGRAM, URETEROSCOPY AND STENT PLACEMENT Right 08/04/2019   Procedure: CYSTOSCOPY WITH RETROGRADE PYELOGRAM, RIGHT URETEROSCOPY, BASKET STONE EXTRACTION;  Surgeon: Irine Seal, MD;  Location: [facility];  Service: Urology;  Laterality: Right;   HEMORROIDECTOMY     young adult   ORIF ANKLE FRACTURE Left 07/30/2018  Procedure: OPEN REDUCTION INTERNAL FIXATION (ORIF) LEFT ANKLE FRACTURE;  Surgeon: Renette Butters, MD;  Location: [facility];  Service: Orthopedics;  Laterality: Left;   PARTIAL PROCTECTOMY BY TEM N/A 05/29/2018   Procedure: PARTIAL PROCTECTOMY BY TEM EXCISION OF ANAL TAG;  Surgeon: Michael Boston, MD;  Location: [facility];  Service: General;  Laterality: N/A;   TOTAL KNEE ARTHROPLASTY     1 knee replacements, 7 knee revision, 28 surgeries on right knee      Current Outpatient Medications:    amoxicillin (AMOXIL) 500 MG capsule, Take 2,000 mg by mouth See admin instructions. Take 2000 mg by mouth 1 hour prior to dental  appointment, Disp: , Rfl: 2   amoxicillin-clavulanate (AUGMENTIN) 875-125 MG tablet, Take 1 tablet by mouth 2 (two) times daily. One po bid x 7 days, Disp: 14 tablet, Rfl: 0   estradiol (ESTRACE) 2 MG tablet, TAKE 1 TABLET (2 MG TOTAL) BY MOUTH DAILY., Disp: 90 tablet, Rfl: 1   ibuprofen (ADVIL) 800 MG tablet, TAKE 1 TABLET BY MOUTH AT ONSET OF MIGRAINE MAY REPEAT IN 6 HOURS AS NEEDED, Disp: 30 tablet, Rfl: 5   levocetirizine (XYZAL) 5 MG tablet, Take 1 tablet (5 mg total) by mouth every evening., Disp: 90 tablet, Rfl: 1   Multiple Vitamin (MULTIVITAMIN WITH MINERALS) TABS tablet, Take 1 tablet by mouth daily., Disp: , Rfl:    ondansetron (ZOFRAN) 4 MG tablet, TAKE 1 TABLET BY MOUTH EVERY 8 HOURS AS NEEDED FOR NAUSEA, Disp: 20 tablet, Rfl: 2   rizatriptan (MAXALT) 10 MG tablet, TAKE 1 TABLET BY MOUTH DAILY AS NEEDED FOR MIGRAINE. MAY REPEAT IN 2 HOURS AS NEEDED, Disp: 9 tablet, Rfl: 2   SUMAtriptan (IMITREX) 50 MG tablet, TAKE 1 TABLET BY MOUTH AT HEADACHE ONSET. MAY REPEAT IN 2 HOURS ONE TIME ONLY, Disp: 9 tablet, Rfl: 5   topiramate (TOPAMAX) 100 MG tablet, Take 1 tablet (100 mg total) by mouth 2 (two) times daily., Disp: 60 tablet, Rfl: 5   Objective:   Vitals:   02/17/20 0856  BP: 121/68  Pulse: 75  Temp: 97.9 F (36.6 C)  SpO2: 100%    Physical Exam Vitals and nursing note reviewed.  Constitutional:      Appearance: Normal appearance.  HENT:     Head: Normocephalic.   Cardiovascular:     Rate and Rhythm: Normal rate.     Pulses: Normal pulses.  Pulmonary:     Effort: Pulmonary effort is normal.  Neurological:     General: No focal deficit present.     Mental Status: She is alert and oriented to person, place, and time.  Psychiatric:        Mood and Affect: Mood normal.        Behavior: Behavior normal.        Thought Content: Thought content normal.     Assessment & Plan:  Dog bite, initial encounter  Recommend giving the area more time to heal.  I would  definitely start massaging any of the firm areas.  I also recommend skinuva.  This should be done twice a day for 3 months.  She would benefit very nicely from an IPL treatment to her neck for her hyperpigmentation and sun damage.  We can provide her with a quote for this.  If we need to do laser on the scar areas that is certainly possible.  I would wait 3 months prior to doing that.  The patient seems to be pleased with  this plan.  Pictures were obtained of the patient and placed in the chart with the patient's or guardian's permission.   [place], DO

## 2020-03-03 MED FILL — TOPIRAMATE 100 MG TABLET: 100 | 30 days supply | Qty: 60 | Fill #2

## 2020-03-18 ENCOUNTER — Other Ambulatory Visit: Payer: Self-pay | Admitting: Family Medicine

## 2020-03-18 MED FILL — IBUPROFEN 800 MG TAB: 800 | 7 days supply | Qty: 30 | Fill #0

## 2020-03-18 MED FILL — RIZATRIPTAN BENZOATE 10 MG: 10 | 30 days supply | Qty: 9 | Fill #2

## 2020-03-18 MED FILL — SUMATRIPTAN SUCC 50 MG TAB: 50 | 30 days supply | Qty: 9 | Fill #4

## 2020-04-06 MED FILL — TOPIRAMATE 100 MG TABLET: 100 | 30 days supply | Qty: 60 | Fill #3

## 2020-04-06 MED FILL — ESTRADIOL 2 MG TABS: 2 | 90 days supply | Qty: 90 | Fill #1

## 2020-05-03 ENCOUNTER — Other Ambulatory Visit: Payer: Self-pay | Admitting: Family Medicine

## 2020-05-03 MED FILL — RIZATRIPTAN BENZOATE 10 MG: 10 | 30 days supply | Qty: 12 | Fill #0

## 2020-05-03 MED FILL — SUMATRIPTAN SUCC 50 MG TAB: 50 | 30 days supply | Qty: 9 | Fill #5

## 2020-05-03 MED FILL — TOPIRAMATE 100 MG TABLET: 100 | 30 days supply | Qty: 60 | Fill #4

## 2020-05-03 MED FILL — ONDANSETRON HCL 4 MG TABLET: 4 | 7 days supply | Qty: 20 | Fill #0

## 2020-05-27 MED FILL — LEVOCETIRIZINE 5 MG TABLET: 5 | 90 days supply | Qty: 90 | Fill #1

## 2020-05-31 MED FILL — TOPIRAMATE 100 MG TABLET: 100 | 30 days supply | Qty: 60 | Fill #5

## 2020-06-10 MED FILL — TOPIRAMATE 100 MG TABLET: 100 | 30 days supply | Qty: 60 | Fill #5

## 2020-06-29 ENCOUNTER — Other Ambulatory Visit: Payer: Self-pay | Admitting: Family Medicine

## 2020-06-29 MED FILL — ESTRADIOL 2 MG TABS: 2 | 90 days supply | Qty: 90 | Fill #0

## 2020-06-29 MED FILL — ONDANSETRON HCL 4 MG TABLET: 4 | 7 days supply | Qty: 20 | Fill #1

## 2020-07-08 MED FILL — RIZATRIPTAN BENZOATE 10 MG: 10 | 30 days supply | Qty: 12 | Fill #1

## 2020-07-08 MED FILL — TOPIRAMATE 100 MG TABLET: 100 | 30 days supply | Qty: 60 | Fill #0

## 2020-07-08 MED FILL — ESTRADIOL 2 MG TABS: 2 | 90 days supply | Qty: 90 | Fill #0

## 2020-07-08 MED FILL — ONDANSETRON HCL 4 MG TABLET: 4 | 7 days supply | Qty: 20 | Fill #1

## 2020-07-09 ENCOUNTER — Other Ambulatory Visit: Payer: Self-pay | Admitting: Family Medicine

## 2020-07-09 MED ORDER — SUMATRIPTAN SUCCINATE 50 MG PO TABS: ORAL_TABLET | ORAL | 5 refills | Status: DC

## 2020-07-09 MED FILL — SUMATRIPTAN SUCC 50 MG TAB: 50 | 30 days supply | Qty: 9 | Fill #0

## 2020-07-20 IMAGING — CT CT MAXILLOFACIAL W/O CM
3 of 5 series · 16 of 47 positions shown, 19 images · non-contrast
Comparison: None.

CLINICAL DATA: Recent dog bite to the chin, initial encounter

EXAM:
CT MAXILLOFACIAL WITHOUT CONTRAST
TECHNIQUE: Multidetector CT imaging of the maxillofacial structures was
performed. Multiplanar CT image reconstructions were also generated.

[Series 2: max soft · axial · 0.33mm/px · z∈[-267,-141]mm · 12 of 73 slices shown, 15 images]
[im 5/73  brain]
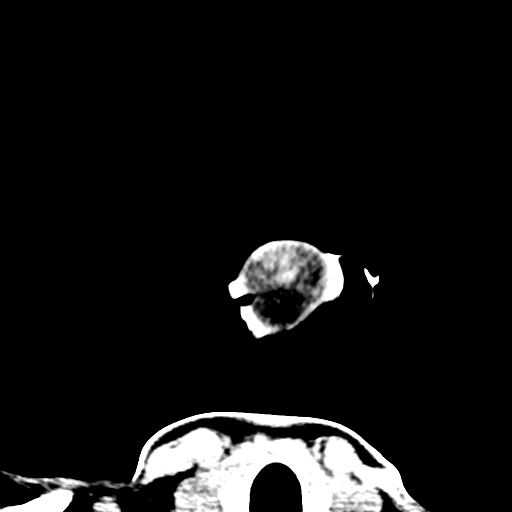
[im 5/73  bone]
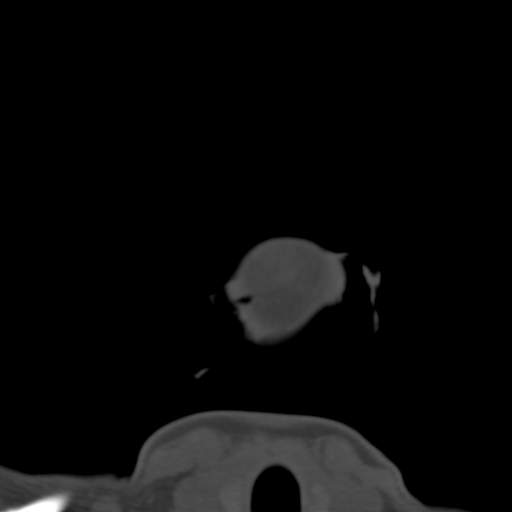
[im 10/73  bone]
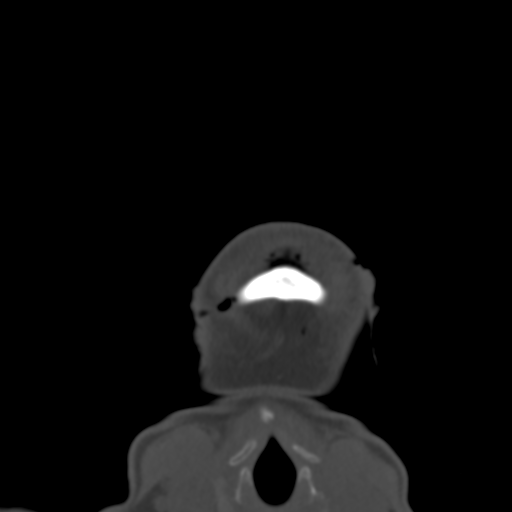
[im 15/73  bone]
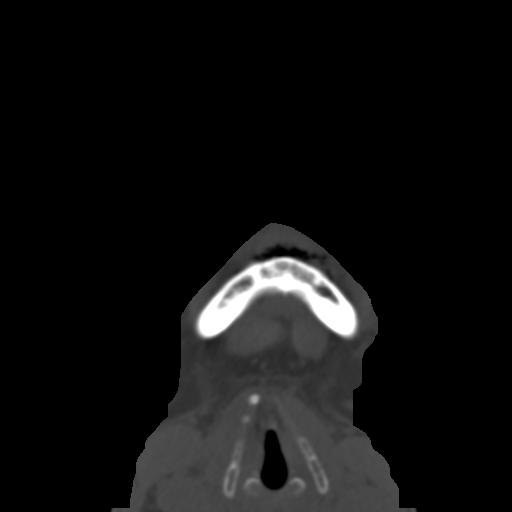
[im 20/73  bone]
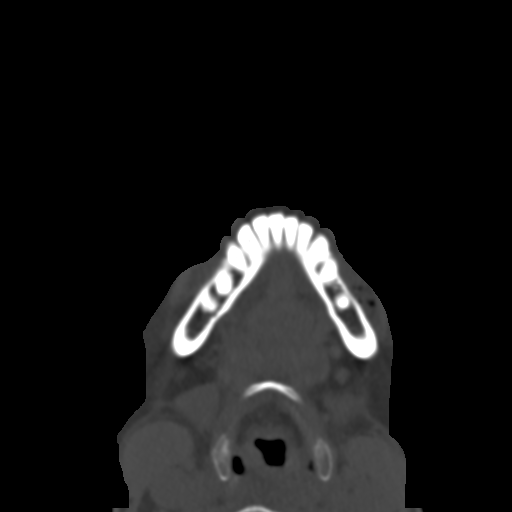
[im 30/73  brain]
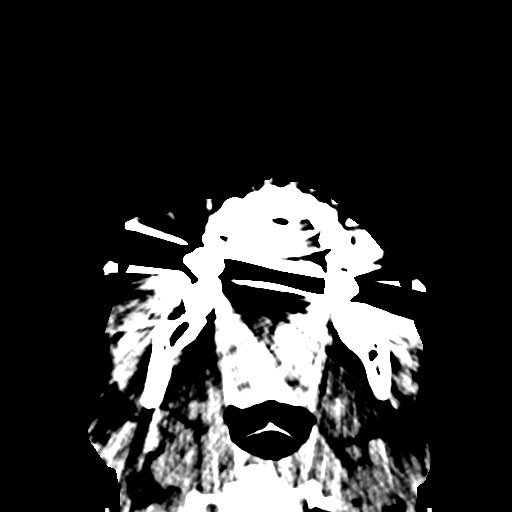
[im 30/73  bone]
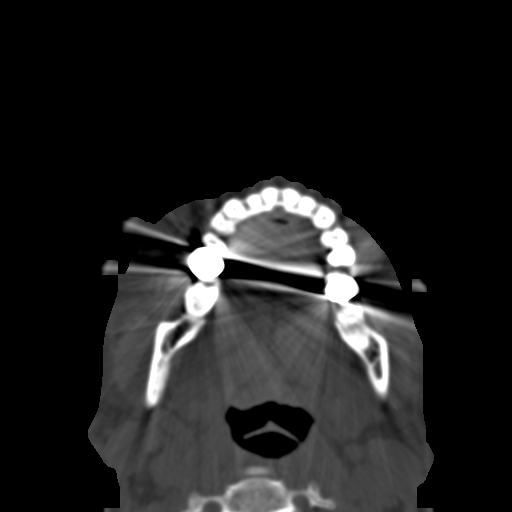
[im 35/73  bone]
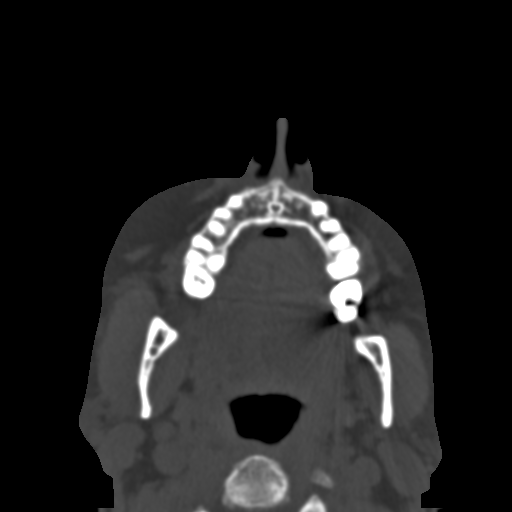
[im 40/73  bone]
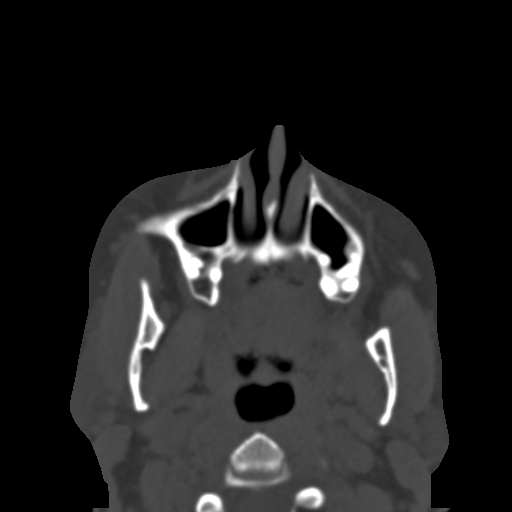
[im 45/73  bone]
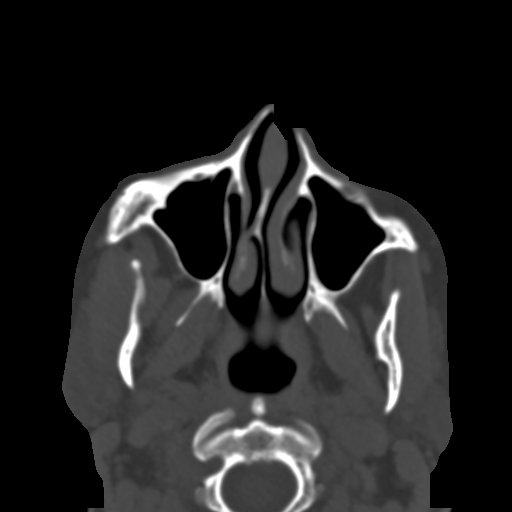
[im 53/73  brain]
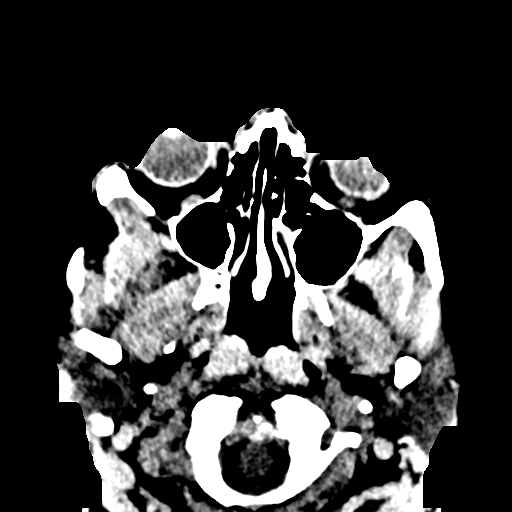
[im 53/73  bone]
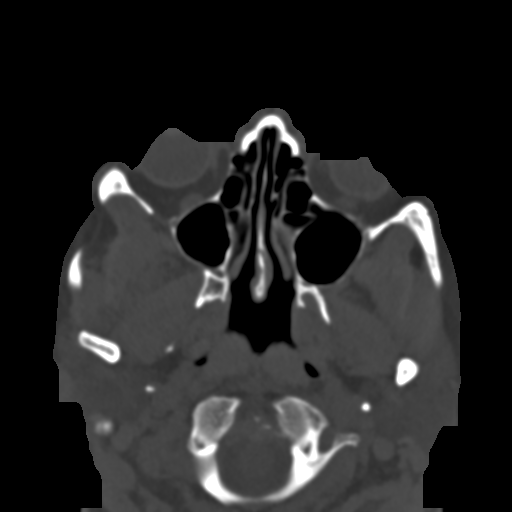
[im 58/73  bone]
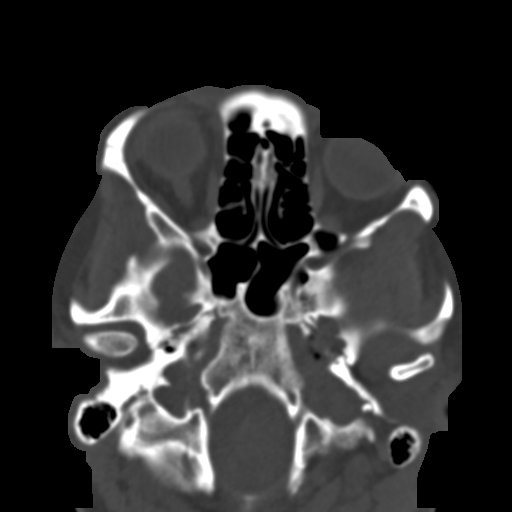
[im 63/73  bone]
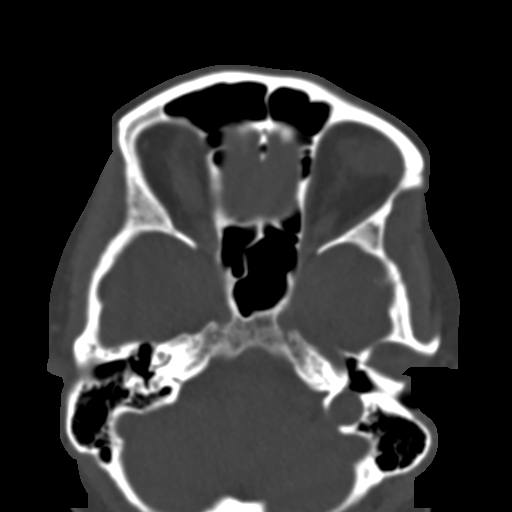
[im 68/73  bone]
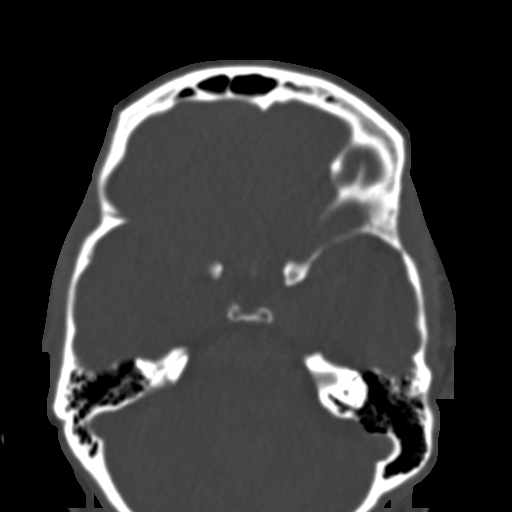

[Series 7: coronal bone · coronal · 0.39mm/px · 3 of 66 slices shown]
[im 17/66  bone]
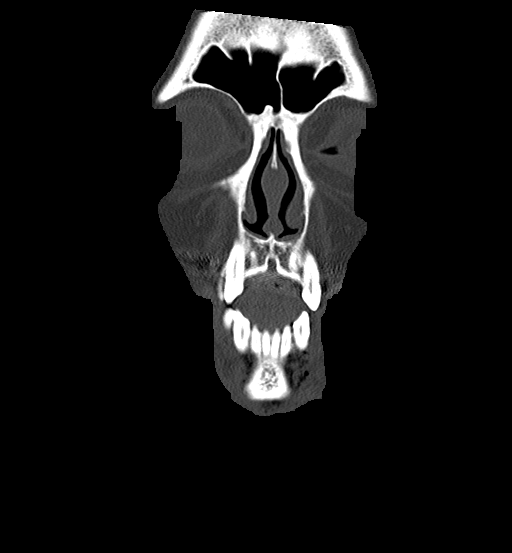
[im 33/66  bone]
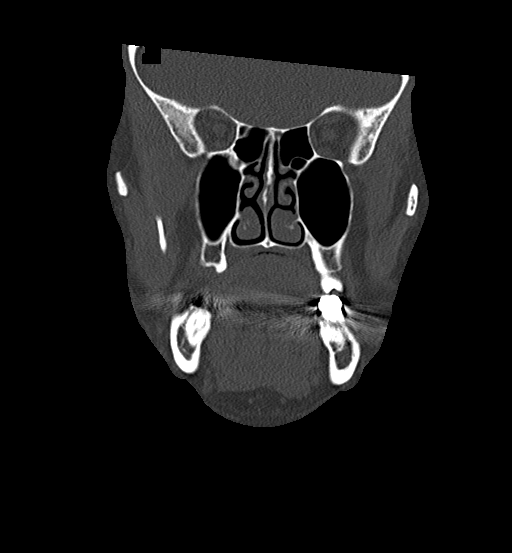
[im 49/66  bone]
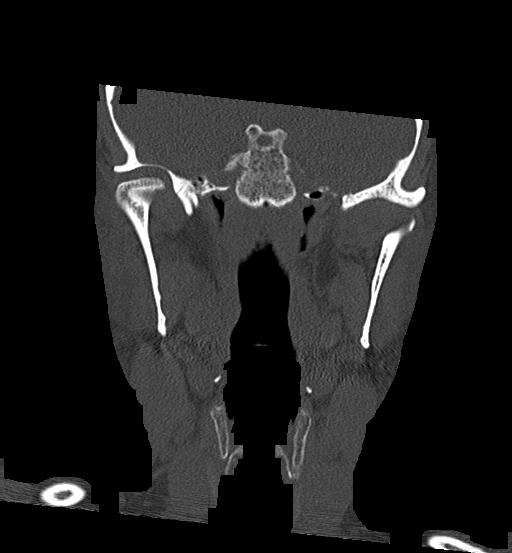

[Series 9: sagittal bone · sagittal · 0.33mm/px · 1 of 66 slices shown]
[im 33/66  bone]
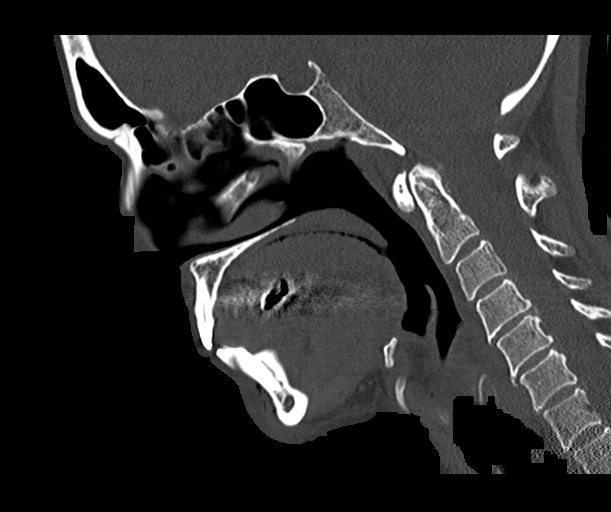

[16 of 47 positions shown; findings below may reference images not displayed]

FINDINGS: Osseous: No acute fracture is identified.

Orbits: Orbits and their contents are within normal limits.

Sinuses: Paranasal sinuses are well visualized.

Soft tissues: Soft tissue changes are noted surrounding the anterior
aspect of the mandible with considerable subcutaneous emphysema
identified consistent with the recent injury. Several puncture
wounds are noted within the skin adjacent to the mandible. No
underlying foreign body is seen.

Limited intracranial: No significant or unexpected finding.
IMPRESSION: Changes consistent with the recent dog bite anterior to mandible
with subcutaneous emphysema. No hematoma or retained foreign body is
noted. No fracture is seen.

## 2020-07-22 MED FILL — IBUPROFEN 800 MG TAB: 800 | 7 days supply | Qty: 30 | Fill #1

## 2020-08-05 MED FILL — TOPIRAMATE 100 MG TABLET: 100 | 30 days supply | Qty: 60 | Fill #1

## 2020-08-19 MED FILL — RIZATRIPTAN BENZOATE 10 MG: 10 | 7 days supply | Qty: 3 | Fill #2

## 2020-09-03 ENCOUNTER — Other Ambulatory Visit: Payer: Self-pay | Admitting: Family Medicine

## 2020-09-03 MED FILL — SUMATRIPTAN SUCC 50 MG TAB: 50 | 30 days supply | Qty: 9 | Fill #1

## 2020-09-03 MED FILL — RIZATRIPTAN BENZOATE 10 MG: 10 | 25 days supply | Qty: 9 | Fill #0

## 2020-09-03 MED FILL — TOPIRAMATE 100 MG TABLET: 100 | 30 days supply | Qty: 60 | Fill #2

## 2020-10-01 ENCOUNTER — Other Ambulatory Visit: Payer: Self-pay | Admitting: Family Medicine

## 2020-10-01 MED FILL — ESTRADIOL 2 MG TABS: 2 | 90 days supply | Qty: 90 | Fill #0

## 2020-10-01 MED FILL — ONDANSETRON HCL 4 MG TABLET: 4 | 7 days supply | Qty: 20 | Fill #2

## 2020-10-01 MED FILL — TOPIRAMATE 100 MG TABLET: 100 | 30 days supply | Qty: 60 | Fill #3

## 2020-10-01 MED FILL — IBUPROFEN 800 MG TAB: 800 | 7 days supply | Qty: 30 | Fill #2

## 2020-10-21 ENCOUNTER — Other Ambulatory Visit (HOSPITAL_COMMUNITY): Payer: Self-pay | Admitting: Pharmacist

## 2020-10-21 MED FILL — CARESTART COVID-19 HOME TES: 2 days supply | Qty: 2 | Fill #0

## 2020-10-26 DIAGNOSIS — H524 Presbyopia: Secondary | ICD-10-CM | POA: Diagnosis not present

## 2020-10-31 ENCOUNTER — Other Ambulatory Visit: Payer: Self-pay | Admitting: Family Medicine

## 2020-10-31 ENCOUNTER — Other Ambulatory Visit (HOSPITAL_BASED_OUTPATIENT_CLINIC_OR_DEPARTMENT_OTHER): Payer: Self-pay

## 2020-11-01 ENCOUNTER — Other Ambulatory Visit (HOSPITAL_BASED_OUTPATIENT_CLINIC_OR_DEPARTMENT_OTHER): Payer: Self-pay

## 2020-11-01 MED FILL — Sumatriptan Succinate Tab 50 MG: ORAL | 18 days supply | Qty: 9 | Fill #0 | Status: AC

## 2020-11-01 MED FILL — Topiramate Tab 100 MG: ORAL | 30 days supply | Qty: 60 | Fill #0 | Status: AC

## 2020-11-02 ENCOUNTER — Other Ambulatory Visit (HOSPITAL_BASED_OUTPATIENT_CLINIC_OR_DEPARTMENT_OTHER): Payer: Self-pay

## 2020-11-05 ENCOUNTER — Other Ambulatory Visit (HOSPITAL_BASED_OUTPATIENT_CLINIC_OR_DEPARTMENT_OTHER): Payer: Self-pay

## 2020-11-05 MED ORDER — CEQUA 0.09 % OP SOLN
OPHTHALMIC | 3 refills | Status: DC
  Filled 2020-11-05: qty 3, 90d supply, fill #0

## 2020-11-08 ENCOUNTER — Other Ambulatory Visit (HOSPITAL_BASED_OUTPATIENT_CLINIC_OR_DEPARTMENT_OTHER): Payer: Self-pay

## 2020-11-09 ENCOUNTER — Other Ambulatory Visit (HOSPITAL_BASED_OUTPATIENT_CLINIC_OR_DEPARTMENT_OTHER): Payer: Self-pay

## 2020-11-09 MED ORDER — CYCLOSPORINE 0.05 % OP EMUL
OPHTHALMIC | 6 refills | Status: DC
  Filled 2020-11-09: qty 5.5, 30d supply, fill #0
  Filled 2020-12-07: qty 5.5, 30d supply, fill #1
  Filled 2021-02-02: qty 5.5, 30d supply, fill #2
  Filled 2021-08-09: qty 5.5, 30d supply, fill #3

## 2020-11-10 ENCOUNTER — Other Ambulatory Visit (HOSPITAL_BASED_OUTPATIENT_CLINIC_OR_DEPARTMENT_OTHER): Payer: Self-pay

## 2020-11-30 ENCOUNTER — Other Ambulatory Visit (HOSPITAL_BASED_OUTPATIENT_CLINIC_OR_DEPARTMENT_OTHER): Payer: Self-pay

## 2020-11-30 MED FILL — Topiramate Tab 100 MG: ORAL | 30 days supply | Qty: 60 | Fill #1 | Status: AC

## 2020-12-07 ENCOUNTER — Other Ambulatory Visit (HOSPITAL_BASED_OUTPATIENT_CLINIC_OR_DEPARTMENT_OTHER): Payer: Self-pay

## 2020-12-07 MED FILL — Ibuprofen Tab 800 MG: ORAL | 8 days supply | Qty: 30 | Fill #0 | Status: AC

## 2021-01-11 ENCOUNTER — Other Ambulatory Visit: Payer: Self-pay | Admitting: Family Medicine

## 2021-01-11 ENCOUNTER — Encounter: Payer: Self-pay | Admitting: Family Medicine

## 2021-01-11 ENCOUNTER — Ambulatory Visit (INDEPENDENT_AMBULATORY_CARE_PROVIDER_SITE_OTHER): Payer: 59 | Admitting: Family Medicine

## 2021-01-11 ENCOUNTER — Other Ambulatory Visit: Payer: Self-pay

## 2021-01-11 ENCOUNTER — Other Ambulatory Visit (HOSPITAL_BASED_OUTPATIENT_CLINIC_OR_DEPARTMENT_OTHER): Payer: Self-pay

## 2021-01-11 VITALS — BP 120/80 | HR 72 | Temp 97.8°F | Ht 59.5 in | Wt 115.0 lb

## 2021-01-11 DIAGNOSIS — Z0000 Encounter for general adult medical examination without abnormal findings: Secondary | ICD-10-CM | POA: Diagnosis not present

## 2021-01-11 DIAGNOSIS — Z1231 Encounter for screening mammogram for malignant neoplasm of breast: Secondary | ICD-10-CM | POA: Diagnosis not present

## 2021-01-11 MED ORDER — ONDANSETRON HCL 4 MG PO TABS
ORAL_TABLET | Freq: Three times a day (TID) | ORAL | 2 refills | Status: DC | PRN
  Filled 2021-01-11: qty 20, 7d supply, fill #0
  Filled 2021-03-03: qty 20, 7d supply, fill #1
  Filled 2021-04-25: qty 20, 7d supply, fill #2

## 2021-01-11 MED ORDER — TOPIRAMATE 100 MG PO TABS
ORAL_TABLET | Freq: Two times a day (BID) | ORAL | 2 refills | Status: DC
  Filled 2021-01-11: qty 180, 90d supply, fill #0
  Filled 2021-04-13: qty 180, 90d supply, fill #1
  Filled 2021-07-07: qty 180, 90d supply, fill #2

## 2021-01-11 MED ORDER — TOPIRAMATE 100 MG PO TABS
ORAL_TABLET | Freq: Two times a day (BID) | ORAL | 5 refills | Status: DC
Start: 2021-01-11 — End: 2021-01-11
  Filled 2021-01-11: qty 60, 30d supply, fill #0

## 2021-01-11 MED ORDER — RIZATRIPTAN BENZOATE 10 MG PO TABS
ORAL_TABLET | ORAL | 2 refills | Status: DC
  Filled 2021-01-11: qty 3, 6d supply, fill #0

## 2021-01-11 MED ORDER — RIZATRIPTAN BENZOATE 10 MG PO TABS
10.0000 mg | ORAL_TABLET | ORAL | 6 refills | Status: DC | PRN
  Filled 2021-01-11: qty 18, 30d supply, fill #0
  Filled 2021-01-11: qty 10, fill #0
  Filled 2021-03-03: qty 18, 30d supply, fill #1
  Filled 2021-04-25: qty 18, 30d supply, fill #2
  Filled 2021-07-07: qty 16, 27d supply, fill #3

## 2021-01-11 MED ORDER — FLOVENT HFA 110 MCG/ACT IN AERO
2.0000 | INHALATION_SPRAY | Freq: Two times a day (BID) | RESPIRATORY_TRACT | 1 refills | Status: AC
  Filled 2021-01-11: qty 12, 30d supply, fill #0

## 2021-01-11 MED ORDER — LEVALBUTEROL TARTRATE 45 MCG/ACT IN AERO
1.0000 | INHALATION_SPRAY | Freq: Four times a day (QID) | RESPIRATORY_TRACT | 12 refills | Status: DC | PRN
  Filled 2021-01-11 – 2021-05-04 (×2): qty 15, 25d supply, fill #0
  Filled 2021-08-09: qty 15, 25d supply, fill #1

## 2021-01-11 NOTE — Progress Notes (Signed)
Chief Complaint  Patient presents with   Annual Exam     Well Woman Alexandra Cabrera is here for a complete physical.   Her last physical was >1 year ago.  Current diet: in general, a "healthy" diet. Current exercise: walking. Weight is stable and she denies fatigue out of ordinary. Seatbelt? Yes  Health Maintenance Mammogram- No Colon cancer screening- Due Shingrix- No Tetanus- Yes Hep C screening- Yes HIV screening- Yes  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, I have never had that problem"; (patient had sevo and succinylcholine with no apparent complication on 13/24/40 at [facility])   Fibula fracture 07/23/2018   Distal fibular fracture   GERD (gastroesophageal reflux disease)    Hemorrhoids 02/04/2018   noted on Colonoscopy   History of appendicitis    History of colon polyps 02/04/2018   History of gallstones    History of kidney stones    Hyperlipidemia, mixed 05/17/2016   Patient denies   IBS (irritable bowel syndrome) 01/03/2015   IC (interstitial cystitis)    Migraines    Pelvic pain    PONV (postoperative nausea and vomiting)    "difficult to wake up."   Preventative health care 01/03/2015   Rash and nonspecific skin eruption 11/01/2015   Seasonal allergies      Past Surgical History:  Procedure Laterality Date   ABDOMINAL HYSTERECTOMY     APPENDECTOMY     CHOLECYSTECTOMY N/A 10/12/2014   Procedure: LAPAROSCOPIC CHOLECYSTECTOMY WITH INTRAOPERATIVE CHOLANGIOGRAM;  Surgeon: Doreen Salvage, MD;  Location: [place];  Service: General;  Laterality: N/A;   COLONOSCOPY  02/04/2018   CYSTOSCOPY WITH RETROGRADE PYELOGRAM, URETEROSCOPY AND STENT  PLACEMENT Right 08/04/2019   Procedure: CYSTOSCOPY WITH RETROGRADE PYELOGRAM, RIGHT URETEROSCOPY, BASKET STONE EXTRACTION;  Surgeon: Irine Seal, MD;  [facility];  Service: Urology;  Laterality: Right;   HEMORROIDECTOMY     young adult   ORIF ANKLE FRACTURE Left 07/30/2018   Procedure: OPEN REDUCTION INTERNAL FIXATION (ORIF) LEFT ANKLE FRACTURE;  Surgeon: Renette Butters, MD;  Location: [place];  Service: Orthopedics;  Laterality: Left;   PARTIAL PROCTECTOMY BY TEM N/A 05/29/2018   Procedure: PARTIAL PROCTECTOMY BY TEM EXCISION OF ANAL TAG;  Surgeon: Michael Boston, MD;  [facility];  Service: General;  Laterality: N/A;   TOTAL KNEE ARTHROPLASTY     1 knee replacements, 7 knee revision, 28 surgeries on right knee    Medications  Current Outpatient Medications on File Prior to Visit  Medication Sig Dispense Refill   amoxicillin (AMOXIL) 500 MG capsule Take 2,000 mg by mouth See admin instructions. Take 2000 mg by mouth 1 hour prior to dental appointment  2   COVID-19 At Home Antigen Test KIT USE AS DIRECTED 2 kit 0   cycloSPORINE (RESTASIS) 0.05 % ophthalmic emulsion Instill 1 drop into both eyes twice a day as directed 180 mL 6   estradiol (ESTRACE) 2 MG tablet TAKE 1 TABLET (2 MG TOTAL) BY MOUTH DAILY. 90 tablet 0   Multiple Vitamin (MULTIVITAMIN WITH MINERALS) TABS tablet Take 1 tablet by mouth daily.  levocetirizine (XYZAL) 5 MG tablet TAKE 1 TABLET BY MOUTH EVERY EVENING 90 tablet 1   No current facility-administered medications on file prior to visit.     Allergies Allergies  Allergen Reactions   Latex Anaphylaxis   Phenergan [Promethazine Hcl] Other (See Comments)    Shaky, tremors   Reglan [Metoclopramide] Anxiety    Review of Systems: Constitutional:  no unexpected weight changes Eye:  no recent significant change in vision Ear/Nose/Mouth/Throat:  Ears:  no recent change in hearing Nose/Mouth/Throat:  no complaints of nasal congestion, no  sore throat Cardiovascular: no chest pain Respiratory:  +chest tightness during weather changes Gastrointestinal:  no abdominal pain, no change in bowel habits GU:  Female: negative for dysuria or pelvic pain Musculoskeletal/Extremities:  no pain of the joints Integumentary (Skin/Breast):  no abnormal skin lesions reported Neurologic:  no headaches Endocrine:  denies fatigue  Exam BP 120/80   Pulse 72   Temp 97.8 F (36.6 C) (Oral)   Ht 4' 11.5" (1.511 m)   Wt 115 lb (52.2 kg)   SpO2 99%   BMI 22.84 kg/m  General:  well developed, well nourished, in no apparent distress Skin:  no significant moles, warts, or growths Head:  no masses, lesions, or tenderness Eyes:  pupils equal and round, sclera anicteric without injection Ears:  canals without lesions, TMs shiny without retraction, no obvious effusion, no erythema Nose:  nares patent, septum midline, mucosa normal, and no drainage or sinus tenderness Throat/Pharynx:  lips and gingiva without lesion; tongue and uvula midline; non-inflamed pharynx; no exudates or postnasal drainage Neck: neck supple without adenopathy, thyromegaly, or masses Lungs:  clear to auscultation, breath sounds equal bilaterally, no respiratory distress Cardio:  regular rate and rhythm, no LE edema Abdomen:  abdomen soft, nontender; bowel sounds normal; no masses or organomegaly Genital: Defer to GYN Musculoskeletal:  symmetrical muscle groups noted without atrophy or deformity Extremities:  no clubbing, cyanosis, or edema, no deformities, no skin discoloration Neuro:  gait normal; deep tendon reflexes normal and symmetric; neg reflex on R knee Psych: well oriented with normal range of affect and appropriate judgment/insight  Assessment and Plan  Well adult exam - Plan: CBC, Comprehensive metabolic panel, Lipid panel  Encounter for screening mammogram for malignant neoplasm of breast - Plan: MM DIGITAL SCREENING BILATERAL   Well 55 y.o.  female. Counseled on diet and exercise. Other orders as above. Declines further colonoscopy/ccs.  Declines Shingrix and covid vacc booster.  Follow up in 6 mo. The patient voiced understanding and agreement to the plan.  [place], DO 01/11/21 1:56 PM

## 2021-01-11 NOTE — Patient Instructions (Addendum)
Give [place] 2-3 business days to get the results of your labs back.   Keep the diet clean and stay active. Add back   The new Shingrix vaccine (for shingles) is a 2 shot series. It can make people feel low energy, achy and almost like they have the flu for 48 hours after injection. Please plan accordingly when deciding on when to get this shot. Call our office for a nurse visit appointment to get this. The second shot of the series is less severe regarding the side effects, but it still lasts 48 hours.   Let us know if you need anything.

## 2021-01-12 ENCOUNTER — Other Ambulatory Visit (HOSPITAL_BASED_OUTPATIENT_CLINIC_OR_DEPARTMENT_OTHER): Payer: Self-pay

## 2021-01-12 LAB — COMPREHENSIVE METABOLIC PANEL
ALT: 17 U/L (ref 0–35)
AST: 18 U/L (ref 0–37)
Albumin: 4.1 g/dL (ref 3.5–5.2)
Alkaline Phosphatase: 54 U/L (ref 39–117)
BUN: 21 mg/dL (ref 6–23)
CO2: 25 mEq/L (ref 19–32)
Calcium: 8.9 mg/dL (ref 8.4–10.5)
Chloride: 107 mEq/L (ref 96–112)
Creatinine, Ser: 0.97 mg/dL (ref 0.40–1.20)
GFR: 65.52 mL/min (ref >60.00)
Glucose, Bld: 84 mg/dL (ref 70–99)
Potassium: 3.9 mEq/L (ref 3.5–5.1)
Sodium: 140 mEq/L (ref 135–145)
Total Bilirubin: 0.4 mg/dL (ref 0.2–1.2)
Total Protein: 6.2 g/dL (ref 6.0–8.3)

## 2021-01-12 LAB — CBC
HCT: 39.2 % (ref 36.0–46.0)
Hemoglobin: 13.3 g/dL (ref 12.0–15.0)
MCHC: 34.1 g/dL (ref 30.0–36.0)
MCV: 95 fl (ref 78.0–100.0)
Platelets: 284 10*3/uL (ref 150.0–400.0)
RBC: 4.12 Mil/uL (ref 3.87–5.11)
RDW: 12.6 % (ref 11.5–15.5)
WBC: 8.7 10*3/uL (ref 4.0–10.5)

## 2021-01-12 LAB — LIPID PANEL
Cholesterol: 196 mg/dL (ref 0–200)
HDL: 75.9 mg/dL (ref >39.00)
LDL Cholesterol: 100 mg/dL — ABNORMAL HIGH (ref 0–99)
NonHDL: 120.51
Total CHOL/HDL Ratio: 3
Triglycerides: 104 mg/dL (ref 0.0–149.0)
VLDL: 20.8 mg/dL (ref 0.0–40.0)

## 2021-01-21 ENCOUNTER — Other Ambulatory Visit (HOSPITAL_BASED_OUTPATIENT_CLINIC_OR_DEPARTMENT_OTHER): Payer: Self-pay

## 2021-02-02 ENCOUNTER — Other Ambulatory Visit (HOSPITAL_BASED_OUTPATIENT_CLINIC_OR_DEPARTMENT_OTHER): Payer: Self-pay

## 2021-02-02 ENCOUNTER — Other Ambulatory Visit: Payer: Self-pay | Admitting: Family Medicine

## 2021-02-02 MED ORDER — ESTRADIOL 2 MG PO TABS
ORAL_TABLET | Freq: Every day | ORAL | 0 refills | Status: DC
Start: 2021-02-02 — End: 2021-04-25
  Filled 2021-02-02: qty 90, 90d supply, fill #0

## 2021-03-03 ENCOUNTER — Other Ambulatory Visit (HOSPITAL_BASED_OUTPATIENT_CLINIC_OR_DEPARTMENT_OTHER): Payer: Self-pay

## 2021-03-03 ENCOUNTER — Other Ambulatory Visit: Payer: Self-pay | Admitting: Family Medicine

## 2021-03-15 ENCOUNTER — Inpatient Hospital Stay (HOSPITAL_BASED_OUTPATIENT_CLINIC_OR_DEPARTMENT_OTHER): Admission: RE | Admit: 2021-03-15 | Payer: 59 | Source: Ambulatory Visit

## 2021-04-14 ENCOUNTER — Other Ambulatory Visit (HOSPITAL_BASED_OUTPATIENT_CLINIC_OR_DEPARTMENT_OTHER): Payer: Self-pay

## 2021-04-25 ENCOUNTER — Encounter (HOSPITAL_BASED_OUTPATIENT_CLINIC_OR_DEPARTMENT_OTHER): Payer: Self-pay

## 2021-04-25 ENCOUNTER — Ambulatory Visit (HOSPITAL_BASED_OUTPATIENT_CLINIC_OR_DEPARTMENT_OTHER)
Admission: RE | Admit: 2021-04-25 | Discharge: 2021-04-25 | Disposition: A | Discharge status: Home / Self Care | Payer: 59 | Source: Ambulatory Visit | Attending: Family Medicine | Admitting: Family Medicine

## 2021-04-25 ENCOUNTER — Other Ambulatory Visit: Payer: Self-pay | Admitting: Family Medicine

## 2021-04-25 ENCOUNTER — Other Ambulatory Visit: Payer: Self-pay

## 2021-04-25 ENCOUNTER — Other Ambulatory Visit (HOSPITAL_BASED_OUTPATIENT_CLINIC_OR_DEPARTMENT_OTHER): Payer: Self-pay

## 2021-04-25 DIAGNOSIS — Z1231 Encounter for screening mammogram for malignant neoplasm of breast: Secondary | ICD-10-CM | POA: Diagnosis not present

## 2021-04-25 MED ORDER — ESTRADIOL 2 MG PO TABS
ORAL_TABLET | Freq: Every day | ORAL | 0 refills | Status: DC
  Filled 2021-04-25: qty 90, 90d supply, fill #0

## 2021-05-04 ENCOUNTER — Other Ambulatory Visit (HOSPITAL_BASED_OUTPATIENT_CLINIC_OR_DEPARTMENT_OTHER): Payer: Self-pay

## 2021-05-04 MED ORDER — CARESTART COVID-19 HOME TEST VI KIT
PACK | 0 refills | Status: DC
  Filled 2021-05-04: qty 2, 4d supply, fill #0

## 2021-07-07 ENCOUNTER — Other Ambulatory Visit (HOSPITAL_BASED_OUTPATIENT_CLINIC_OR_DEPARTMENT_OTHER): Payer: Self-pay

## 2021-07-07 ENCOUNTER — Other Ambulatory Visit: Payer: Self-pay | Admitting: Family Medicine

## 2021-07-07 MED ORDER — ESTRADIOL 2 MG PO TABS
ORAL_TABLET | Freq: Every day | ORAL | 0 refills | Status: DC
  Filled 2021-07-07: qty 90, 90d supply, fill #0

## 2021-07-07 MED ORDER — ONDANSETRON HCL 4 MG PO TABS
4.0000 mg | ORAL_TABLET | Freq: Three times a day (TID) | ORAL | 2 refills | Status: DC | PRN
  Filled 2021-07-07: qty 20, 7d supply, fill #0
  Filled 2021-09-28: qty 20, 7d supply, fill #1
  Filled 2022-01-03: qty 20, 7d supply, fill #2

## 2021-07-13 ENCOUNTER — Other Ambulatory Visit (HOSPITAL_BASED_OUTPATIENT_CLINIC_OR_DEPARTMENT_OTHER): Payer: Self-pay

## 2021-07-13 ENCOUNTER — Ambulatory Visit: Payer: 59 | Admitting: Family Medicine

## 2021-07-13 ENCOUNTER — Encounter: Payer: Self-pay | Admitting: Family Medicine

## 2021-07-13 VITALS — BP 118/78 | HR 69 | Temp 98.0°F | Ht 59.5 in | Wt 113.0 lb

## 2021-07-13 DIAGNOSIS — M7989 Other specified soft tissue disorders: Secondary | ICD-10-CM

## 2021-07-13 DIAGNOSIS — G43909 Migraine, unspecified, not intractable, without status migrainosus: Secondary | ICD-10-CM

## 2021-07-13 DIAGNOSIS — L700 Acne vulgaris: Secondary | ICD-10-CM | POA: Diagnosis not present

## 2021-07-13 MED ORDER — DOXYCYCLINE HYCLATE 100 MG PO TABS
100.0000 mg | ORAL_TABLET | Freq: Two times a day (BID) | ORAL | 0 refills | Status: AC
  Filled 2021-07-13: qty 28, 14d supply, fill #0

## 2021-07-13 MED ORDER — KETOROLAC TROMETHAMINE 60 MG/2ML IM SOLN
60.0000 mg | Freq: Once | INTRAMUSCULAR | Status: AC
  Administered 2021-07-13: 11:00:00 60 mg via INTRAMUSCULAR

## 2021-07-13 MED ORDER — RIZATRIPTAN BENZOATE 10 MG PO TABS
10.0000 mg | ORAL_TABLET | ORAL | 6 refills | Status: DC | PRN
  Filled 2021-07-13 – 2021-09-29 (×2): qty 10, 17d supply, fill #0
  Filled 2021-11-24: qty 10, 17d supply, fill #1
  Filled 2022-01-03: qty 10, 17d supply, fill #2
  Filled 2022-02-20: qty 10, 17d supply, fill #3
  Filled 2022-03-12: qty 10, 17d supply, fill #4

## 2021-07-13 MED ORDER — TOPIRAMATE 100 MG PO TABS
ORAL_TABLET | Freq: Two times a day (BID) | ORAL | 2 refills | Status: DC
  Filled 2021-07-13: qty 180, fill #0
  Filled 2021-09-28: qty 180, 90d supply, fill #0
  Filled 2022-01-03: qty 180, 90d supply, fill #1

## 2021-07-13 NOTE — Progress Notes (Signed)
Chief Complaint  Patient presents with   Follow-up    6 month Headache     Alexandra Cabrera is a 55 y.o. female here for evaluation of right headache.  Treatment: Maxalt prn, Topamax 100 mg bid Aura: yes, she smells smoke Palliation: none Provocation: none Associated symptoms: vertigo Denies: Gum chewing, jaw pain, injury, nausea, vomiting Currently with headache? Yes  Hx of cystic acne on face reactive to nitrile gloves used at dentist's office. Recently went and started to have a flare. No itching, +pain, no drainage or fevers. Doxy typically works well, requesting rx.   Hx of mass on R UE. Told it was inoperable by ortho several years ago. Over past 3 mo, starting to get more bothersome. Will wake up in middle of night w swelling and numbness. Has never had imaging beyond [place].   BP 118/78    Pulse 69    Temp 98 F (36.7 C) (Oral)    Ht 4' 11.5" (1.511 m)    Wt 113 lb (51.3 kg)    SpO2 96%    BMI 22.44 kg/m  General: awake, alert, appearing stated age Eyes: PERRLA, EOMi Skin: Rubbery mass in R lateral upper arm without fluctuance, ttp, erythema, drainage Lungs: CTAB, no accessory muscle use Neuro: CN 2-12 intact, no cerebellar signs, DTR's equal and symmetry, no clonus MSK: 5/5 strength throughout, normal gait, no TTP over posterior cervical triangle or paraspinal cervical musculature Psych: Age appropriate judgment and insight, mood and affect normal  Migraine without status migrainosus, not intractable, unspecified migraine type - Plan: topiramate (TOPAMAX) 100 MG tablet, rizatriptan (MAXALT) 10 MG tablet, ketorolac (TORADOL) injection 60 mg  Cystic acne - Plan: doxycycline (VIBRA-TABS) 100 MG tablet  Soft tissue mass - Plan: Ambulatory referral to General Surgery  Exacerbation of chronic issue. Cont Topamax 100 mg bid, Maxalt prn. Toradol injection today to break migraine.  14 d of doxy.  Refer gen surg for opinion. May need more imaging.  Follow up in 4 weeks. The patient  voiced understanding and agreement to the plan.  Crosby Oyster Louise, DO 1:21 PM 07/13/21

## 2021-07-13 NOTE — Patient Instructions (Addendum)
Let me know if you need refills.  If you do not hear anything about your referral in the next 1-2 weeks, call our office and ask for an update.  Let us know if you need anything.

## 2021-08-04 ENCOUNTER — Encounter: Payer: Self-pay | Admitting: Family Medicine

## 2021-08-09 ENCOUNTER — Other Ambulatory Visit (HOSPITAL_BASED_OUTPATIENT_CLINIC_OR_DEPARTMENT_OTHER): Payer: Self-pay

## 2021-08-10 ENCOUNTER — Other Ambulatory Visit (HOSPITAL_BASED_OUTPATIENT_CLINIC_OR_DEPARTMENT_OTHER): Payer: Self-pay

## 2021-08-19 ENCOUNTER — Other Ambulatory Visit (HOSPITAL_BASED_OUTPATIENT_CLINIC_OR_DEPARTMENT_OTHER): Payer: Self-pay

## 2021-08-19 MED ORDER — CARESTART COVID-19 HOME TEST VI KIT
PACK | 0 refills | Status: DC
  Filled 2021-08-19 – 2021-09-02 (×2): qty 2, 4d supply, fill #0

## 2021-08-25 DIAGNOSIS — D1720 Benign lipomatous neoplasm of skin and subcutaneous tissue of unspecified limb: Secondary | ICD-10-CM | POA: Diagnosis not present

## 2021-08-30 ENCOUNTER — Other Ambulatory Visit: Payer: Self-pay | Admitting: Family Medicine

## 2021-08-30 ENCOUNTER — Other Ambulatory Visit: Payer: Self-pay | Admitting: General Surgery

## 2021-08-30 ENCOUNTER — Other Ambulatory Visit (HOSPITAL_BASED_OUTPATIENT_CLINIC_OR_DEPARTMENT_OTHER): Payer: Self-pay

## 2021-08-30 ENCOUNTER — Other Ambulatory Visit (HOSPITAL_BASED_OUTPATIENT_CLINIC_OR_DEPARTMENT_OTHER): Payer: Self-pay | Admitting: General Surgery

## 2021-08-30 ENCOUNTER — Telehealth (HOSPITAL_BASED_OUTPATIENT_CLINIC_OR_DEPARTMENT_OTHER): Payer: Self-pay

## 2021-08-30 ENCOUNTER — Other Ambulatory Visit (HOSPITAL_COMMUNITY): Payer: Self-pay | Admitting: General Surgery

## 2021-08-30 ENCOUNTER — Encounter: Payer: Self-pay | Admitting: Family Medicine

## 2021-08-30 DIAGNOSIS — D1720 Benign lipomatous neoplasm of skin and subcutaneous tissue of unspecified limb: Secondary | ICD-10-CM

## 2021-08-31 ENCOUNTER — Other Ambulatory Visit: Payer: Self-pay | Admitting: Family Medicine

## 2021-08-31 ENCOUNTER — Other Ambulatory Visit (HOSPITAL_BASED_OUTPATIENT_CLINIC_OR_DEPARTMENT_OTHER): Payer: Self-pay

## 2021-08-31 MED ORDER — LORATADINE-PSEUDOEPHEDRINE ER 10-240 MG PO TB24
1.0000 | ORAL_TABLET | Freq: Every day | ORAL | 3 refills | Status: AC
  Filled 2021-08-31: qty 30, 30d supply, fill #0
  Filled 2021-09-28: qty 30, 30d supply, fill #1

## 2021-09-01 ENCOUNTER — Other Ambulatory Visit (HOSPITAL_BASED_OUTPATIENT_CLINIC_OR_DEPARTMENT_OTHER): Payer: Self-pay

## 2021-09-02 ENCOUNTER — Other Ambulatory Visit (HOSPITAL_BASED_OUTPATIENT_CLINIC_OR_DEPARTMENT_OTHER): Payer: Self-pay

## 2021-09-07 ENCOUNTER — Ambulatory Visit (HOSPITAL_BASED_OUTPATIENT_CLINIC_OR_DEPARTMENT_OTHER)
Admission: RE | Admit: 2021-09-07 | Discharge: 2021-09-07 | Disposition: A | Discharge status: Home / Self Care | Payer: 59 | Source: Ambulatory Visit | Attending: General Surgery | Admitting: General Surgery

## 2021-09-07 ENCOUNTER — Encounter (HOSPITAL_BASED_OUTPATIENT_CLINIC_OR_DEPARTMENT_OTHER): Payer: Self-pay

## 2021-09-07 ENCOUNTER — Other Ambulatory Visit: Payer: Self-pay

## 2021-09-07 DIAGNOSIS — R2231 Localized swelling, mass and lump, right upper limb: Secondary | ICD-10-CM | POA: Diagnosis not present

## 2021-09-07 DIAGNOSIS — D1720 Benign lipomatous neoplasm of skin and subcutaneous tissue of unspecified limb: Secondary | ICD-10-CM | POA: Insufficient documentation

## 2021-09-07 MED ORDER — IOHEXOL 300 MG/ML  SOLN
100.0000 mL | Freq: Once | INTRAMUSCULAR | Status: AC | PRN
  Administered 2021-09-07: 100 mL via INTRAVENOUS

## 2021-09-28 ENCOUNTER — Other Ambulatory Visit (HOSPITAL_BASED_OUTPATIENT_CLINIC_OR_DEPARTMENT_OTHER): Payer: Self-pay

## 2021-09-28 ENCOUNTER — Other Ambulatory Visit: Payer: Self-pay | Admitting: Family Medicine

## 2021-09-28 MED ORDER — ESTRADIOL 2 MG PO TABS
ORAL_TABLET | Freq: Every day | ORAL | 0 refills | Status: DC
  Filled 2021-09-28: qty 90, 90d supply, fill #0

## 2021-09-30 ENCOUNTER — Other Ambulatory Visit (HOSPITAL_BASED_OUTPATIENT_CLINIC_OR_DEPARTMENT_OTHER): Payer: Self-pay

## 2021-10-11 NOTE — Pre-Procedure Instructions (Signed)
Surgical Instructions ? ? ? Your procedure is scheduled on Friday 10/14/21. ? ? Report to [facility] Main Entrance "A" at 09:45 A.M., then check in with the Admitting office. ? Call this number if you have problems the morning of surgery: ? [phone] ? ? If you have any questions prior to your surgery date call [phone]: Open Monday-Friday 8am-4pm ? ? ? Remember: ? Do not eat after midnight the night before your surgery ? ?You may drink clear liquids until 08:45 A.M. the morning of your surgery.   ?Clear liquids allowed are: Water, Non-Citrus Juices (without pulp), Carbonated Beverages, Clear Tea, Black Coffee ONLY (NO MILK, CREAM OR POWDERED CREAMER of any kind), and Gatorade ?  ? Take these medicines the morning of surgery with A SIP OF WATER:  ? estradiol (ESTRACE) ? topiramate (TOPAMAX) ? ? Take these medicines if needed:  ? cycloSPORINE (RESTASIS) ? fluticasone (FLOVENT HFA)  ? levalbuterol [facility] HFA) ? ondansetron [facility]) ? rizatriptan (MAXALT) ? ? ?As of today, STOP taking any Aspirin (unless otherwise instructed by your surgeon) Aleve, Naproxen, Ibuprofen, Motrin, Advil, Goody's, BC's, all herbal medications, fish oil, and all vitamins. ? ?         ?Do not wear jewelry or makeup ?Do not wear lotions, powders, perfumes/colognes, or deodorant. ?Do not shave 48 hours prior to surgery.  Men may shave face and neck. ?Do not bring valuables to the hospital. ?Do not wear nail polish, gel polish, artificial nails, or any other type of covering on natural nails (fingers and toes) ?If you have artificial nails or gel coating that need to be removed by a nail salon, please have this removed prior to surgery. Artificial nails or gel coating may interfere with anesthesia's ability to adequately monitor your vital signs. ? ?[facility] is not responsible for any belongings or valuables. .  ? ?Do NOT Smoke (Tobacco/Vaping)  24 hours prior to your procedure ? ?If you use a CPAP at night, you may bring your mask  for your overnight stay. ?  ?Contacts, glasses, hearing aids, dentures or partials may not be worn into surgery, please bring cases for these belongings ?  ?For patients admitted to the hospital, discharge time will be determined by your treatment team. ?  ?Patients discharged the day of surgery will not be allowed to drive home, and someone needs to stay with them for 24 hours. ? ?NO VISITORS WILL BE ALLOWED IN PRE-OP WHERE PATIENTS ARE PREPPED FOR SURGERY.  ONLY 1 SUPPORT PERSON MAY BE PRESENT IN THE WAITING ROOM WHILE YOU ARE IN SURGERY.  IF YOU ARE TO BE ADMITTED, ONCE YOU ARE IN YOUR ROOM YOU WILL BE ALLOWED TWO (2) VISITORS. 1 (ONE) VISITOR MAY STAY OVERNIGHT BUT MUST ARRIVE TO THE ROOM BY 8pm.  Minor children may have two parents present. Special consideration for safety and communication needs will be reviewed on a case by case basis. ? ?Special instructions:   ? ?Oral Hygiene is also important to reduce your risk of infection.  Remember - BRUSH YOUR TEETH THE MORNING OF SURGERY WITH YOUR REGULAR TOOTHPASTE ? ? ?[facility]- Preparing For Surgery ? ?Before surgery, you can play an important role. Because skin is not sterile, your skin needs to be as free of germs as possible. You can reduce the number of germs on your skin by washing with CHG (chlorahexidine gluconate) Soap before surgery.  CHG is an antiseptic cleaner which kills germs and bonds with the skin to continue killing germs  even after washing.   ? ? ?Please do not use if you have an allergy to CHG or antibacterial soaps. If your skin becomes reddened/irritated stop using the CHG.  ?Do not shave (including legs and underarms) for at least 48 hours prior to first CHG shower. It is OK to shave your face. ? ?Please follow these instructions carefully. ?  ? ? Shower the NIGHT BEFORE SURGERY and the MORNING OF SURGERY with CHG Soap.  ? If you chose to wash your hair, wash your hair first as usual with your normal shampoo. After you shampoo, rinse your  hair and body thoroughly to remove the shampoo.  Then [employer] and genitals (private parts) with your normal soap and rinse thoroughly to remove soap. ? ?After that Use CHG Soap as you would any other liquid soap. You can apply CHG directly to the skin and wash gently with a scrungie or a clean washcloth.  ? ?Apply the CHG Soap to your body ONLY FROM THE NECK DOWN.  Do not use on open wounds or open sores. Avoid contact with your eyes, ears, mouth and genitals (private parts). Wash Face and genitals (private parts)  with your normal soap.  ? ?Wash thoroughly, paying special attention to the area where your surgery will be performed. ? ?Thoroughly rinse your body with warm water from the neck down. ? ?DO NOT shower/wash with your normal soap after using and rinsing off the CHG Soap. ? ?Pat yourself dry with a CLEAN TOWEL. ? ?Wear CLEAN PAJAMAS to bed the night before surgery ? ?Place CLEAN SHEETS on your bed the night before your surgery ? ?DO NOT SLEEP WITH PETS. ? ? ?Day of Surgery: ? ?Take a shower with CHG soap. ?Wear Clean/Comfortable clothing the morning of surgery ?Do not apply any deodorants/lotions.   ?Remember to brush your teeth WITH YOUR REGULAR TOOTHPASTE. ? ? ? ?COVID testing ? ?If you are going to stay overnight or be admitted after your procedure/surgery and require a pre-op COVID test, please follow these instructions after your COVID test  ? ?You are not required to quarantine however you are required to wear a well-fitting mask when you are out and around people not in your household.  If your mask becomes wet or soiled, replace with a new one. ? ?Wash your hands often with soap and water for 20 seconds or clean your hands with an alcohol-based hand sanitizer that contains at least 60% alcohol. ? ?Do not share personal items. ? ?Notify your provider: ?if you are in close contact with someone who has COVID  ?or if you develop a fever of 100.4 or greater, sneezing, cough, sore throat, shortness of  breath or body aches. ? ?  ?Please read over the following fact sheets that you were given.  ? ?

## 2021-10-12 ENCOUNTER — Other Ambulatory Visit: Payer: Self-pay

## 2021-10-12 ENCOUNTER — Encounter (HOSPITAL_COMMUNITY)
Admission: RE | Admit: 2021-10-12 | Discharge: 2021-10-12 | Disposition: A | Discharge status: Home / Self Care | Payer: 59 | Source: Ambulatory Visit | Attending: General Surgery | Admitting: General Surgery

## 2021-10-12 ENCOUNTER — Encounter (HOSPITAL_COMMUNITY): Payer: Self-pay

## 2021-10-12 VITALS — BP 125/82 | HR 60 | Temp 97.9°F | Resp 17 | Ht 59.0 in | Wt 114.6 lb

## 2021-10-12 DIAGNOSIS — J45909 Unspecified asthma, uncomplicated: Secondary | ICD-10-CM | POA: Insufficient documentation

## 2021-10-12 DIAGNOSIS — Z01818 Encounter for other preprocedural examination: Secondary | ICD-10-CM

## 2021-10-12 DIAGNOSIS — Z8489 Family history of other specified conditions: Secondary | ICD-10-CM | POA: Insufficient documentation

## 2021-10-12 DIAGNOSIS — D1721 Benign lipomatous neoplasm of skin and subcutaneous tissue of right arm: Secondary | ICD-10-CM | POA: Diagnosis not present

## 2021-10-12 DIAGNOSIS — Z01812 Encounter for preprocedural laboratory examination: Secondary | ICD-10-CM | POA: Insufficient documentation

## 2021-10-12 LAB — CBC
HCT: 40.8 % (ref 36.0–46.0)
Hemoglobin: 13.3 g/dL (ref 12.0–15.0)
MCH: 31.5 pg (ref 26.0–34.0)
MCHC: 32.6 g/dL (ref 30.0–36.0)
MCV: 96.7 fL (ref 80.0–100.0)
Platelets: 266 10*3/uL (ref 150–400)
RBC: 4.22 MIL/uL (ref 3.87–5.11)
RDW: 12.9 % (ref 11.5–15.5)
WBC: 6.3 10*3/uL (ref 4.0–10.5)
nRBC: 0 % (ref 0.0–0.2)

## 2021-10-12 LAB — BASIC METABOLIC PANEL
Anion gap: 8 (ref 5–15)
BUN: 13 mg/dL (ref 6–20)
CO2: 23 mmol/L (ref 22–32)
Calcium: 9 mg/dL (ref 8.9–10.3)
Chloride: 109 mmol/L (ref 98–111)
Creatinine, Ser: 0.94 mg/dL (ref 0.44–1.00)
GFR, Estimated: >60 mL/min (ref >60)
Glucose, Bld: 85 mg/dL (ref 70–99)
Potassium: 4 mmol/L (ref 3.5–5.1)
Sodium: 140 mmol/L (ref 135–145)

## 2021-10-12 NOTE — Progress Notes (Signed)
PCP: Dr. Riki Sheer ?Cardiologist: denies ? ?EKG: n/a ?CXR: n/a ?ECHO: denies ?Stress Test: denies ?Cardiac Cath: denies ? ?ERAS: clear liquids ? ?Patient denies shortness of breath, fever, cough, and chest pain at PAT appointment. ? ?Patient verbalized understanding of instructions provided today at the PAT appointment.  Patient asked to review instructions at home and day of surgery.  ? ?Chart forwarded to anesthesia: Family history of malignant hyperthermia. Pt reports she has tolerated anesthesia without incident. Does report post-op N/V, but does well with scope patch.  Per Durel Salts FNP, does not need to see during appt. ?

## 2021-10-13 NOTE — Anesthesia Preprocedure Evaluation (Addendum)
Anesthesia Evaluation  ?Patient identified by MRN, date of birth, ID band ?Patient awake ? ? ? ?Reviewed: ?Allergy & Precautions, NPO status , Patient's Chart, lab work & pertinent test results ? ?History of Anesthesia Complications ?(+) PONV, MALIGNANT HYPERTHERMIA, Family history of anesthesia reaction and history of anesthetic complications (sister with MH) ? ?Airway ?Mallampati: II ? ?TM Distance: >3 FB ?Neck ROM: Full ? ? ? Dental ?no notable dental hx. ? ?  ?Pulmonary ?neg pulmonary ROS, asthma ,  ?  ?Pulmonary exam normal ?breath sounds clear to auscultation ? ? ? ? ? ? Cardiovascular ?negative cardio ROS ?Normal cardiovascular exam ?Rhythm:Regular Rate:Normal ? ? ?  ?Neuro/Psych ? Headaches, negative neurological ROS ? negative psych ROS  ? GI/Hepatic ?negative GI ROS, Neg liver ROS, GERD  Medicated,  ?Endo/Other  ?negative endocrine ROS ? Renal/GU ?negative Renal ROS  ?negative genitourinary ?  ?Musculoskeletal ?negative musculoskeletal ROS ?(+) Arthritis , Osteoarthritis,   ? Abdominal ?  ?Peds ?negative pediatric ROS ?(+)  Hematology ?negative hematology ROS ?(+)   ?Anesthesia Other Findings ? ? Reproductive/Obstetrics ?negative OB ROS ? ?  ? ? ? ? ? ? ? ? ? ? ? ? ? ?  ?  ? ? ? ? ? ? ?Anesthesia Physical ?Anesthesia Plan ? ?ASA: 2 ? ?Anesthesia Plan: General  ? ?Post-op Pain Management: Minimal or no pain anticipated  ? ?Induction: Intravenous ? ?PONV Risk Score and Plan: 4 or greater and TIVA, Ondansetron, Dexamethasone, Midazolam and Treatment may vary due to age or medical condition ? ?Airway Management Planned: LMA ? ?Additional Equipment: None ? ?Intra-op Plan:  ? ?Post-operative Plan: Extubation in OR ? ?Informed Consent: I have reviewed the patients History and Physical, chart, labs and discussed the procedure including the risks, benefits and alternatives for the proposed anesthesia with the patient or authorized representative who has indicated his/her  understanding and acceptance.  ? ? ? ?Dental advisory given ? ?Plan Discussed with: CRNA ? ?Anesthesia Plan Comments: (See APP note by Durel Salts, FNP  ? ?Plan non triggering anesthetic.  TIVA)  ? ? ? ?Anesthesia Quick Evaluation ? ?

## 2021-10-13 NOTE — Progress Notes (Signed)
Anesthesia Chart Review: ? ? Case: [id number] Date/Time: 10/14/21 1130  ? Procedure: EXCISION OF RIGHT SHOULDER LIPOMA (Right)  ? Anesthesia type: General  ? Pre-op diagnosis: RIGHT SHOULDER LIPOMA  ? [facility] ROOM 12 / [facility]  ? Surgeons: Jovita Kussmaul, MD  ? ?  ? ? ?DISCUSSION: ?Pt is 56 years old with hx asthma ? ?Anesthesia history:  ?Pt's sister has malignant hyperthermia. Pt reports she does not have [place] and has received triggering anesthetics in the past. Review of anesthesia records in Epic show pt received desflurane 33/29/51 without complication. I do not see where pt has received any other triggering agents in the past in Epic. Pt reports she had sevo and succinylcholine with no apparent complication on 88/41/66 at [facility] ? ?Pt reports she does experience post-op N/V ? ? ?VS: BP 125/82   Pulse 60   Temp 36.6 ?C (Oral)   Resp 17   Ht _0  (1.499 m)   Wt 52 kg   SpO2 100%   BMI 23.15 kg/m?  ? ?PROVIDERS: ?- PCP is Shelda Pal, DO ? ? ?LABS: Labs reviewed: Acceptable for surgery. ?(all labs ordered are listed, but only abnormal results are displayed) ? ?Labs Reviewed  ?CBC  ?BASIC METABOLIC PANEL  ? ? ?EKG: N/A ? ? ?CV: N/A ? ?Past Medical History:  ?Diagnosis Date  ? Acne 11/01/2015  ? Allergy   ? Arthritis   ? Asthma   ? childhood and rare as adult  ? Blood transfusion without reported diagnosis   ? Diverticulosis 02/04/2018  ? Mild sigmoid, noted on Colonoscopy  ? Dyspareunia, female   ? Family history of adverse reaction to anesthesia   ? " my sisiter has had Malignant hypertheria twice, I have never had that problem"; (patient had sevo and succinylcholine with no apparent complication on 01/28/15 at [facility])  ? Fibula fracture 07/23/2018  ? Distal fibular fracture  ? GERD (gastroesophageal reflux disease)   ? Hemorrhoids 02/04/2018  ? noted on Colonoscopy  ? History of appendicitis   ? History of colon polyps 02/04/2018  ? History of gallstones   ?  History of kidney stones   ? Hyperlipidemia, mixed 05/17/2016  ? Patient denies  ? IBS (irritable bowel syndrome) 01/03/2015  ? IC (interstitial cystitis)   ? Migraines   ? Pelvic pain   ? PONV (postoperative nausea and vomiting)   ? "difficult to wake up."  ? Preventative health care 01/03/2015  ? Rash and nonspecific skin eruption 11/01/2015  ? Seasonal allergies   ? ? ?Past Surgical History:  ?Procedure Laterality Date  ? ABDOMINAL HYSTERECTOMY    ? APPENDECTOMY    ? CHOLECYSTECTOMY N/A 10/12/2014  ? Procedure: LAPAROSCOPIC CHOLECYSTECTOMY WITH INTRAOPERATIVE CHOLANGIOGRAM;  Surgeon: Doreen Salvage, MD;  Location: [place];  Service: General;  Laterality: N/A;  ? COLONOSCOPY  02/04/2018  ? CYSTOSCOPY WITH RETROGRADE PYELOGRAM, URETEROSCOPY AND STENT PLACEMENT Right 08/04/2019  ? Procedure: CYSTOSCOPY WITH RETROGRADE PYELOGRAM, RIGHT URETEROSCOPY, BASKET STONE EXTRACTION;  Surgeon: Irine Seal, MD;  [facility];  Service: Urology;  Laterality: Right;  ? HEMORROIDECTOMY    ? young adult  ? ORIF ANKLE FRACTURE Left 07/30/2018  ? Procedure: OPEN REDUCTION INTERNAL FIXATION (ORIF) LEFT ANKLE FRACTURE;  Surgeon: Renette Butters, MD;  Location: [place];  Service: Orthopedics;  Laterality: Left;  ? PARTIAL PROCTECTOMY BY TEM N/A 05/29/2018  ? Procedure: PARTIAL PROCTECTOMY BY TEM EXCISION OF ANAL TAG;  Surgeon: Michael Boston,  MD;  [facility];  Service: General;  Laterality: N/A;  ? TOTAL KNEE ARTHROPLASTY    ? 1 knee replacements, 7 knee revision, 28 surgeries on right knee  ? ? ?MEDICATIONS: ? amoxicillin (AMOXIL) 500 MG capsule  ? COVID-19 At Home Antigen Test [facility] COVID-19 HOME TEST) KIT  ? COVID-19 At Home Antigen Test [facility] COVID-19 HOME TEST) KIT  ? COVID-19 At Home Antigen Test KIT  ? cycloSPORINE (RESTASIS) 0.05 % ophthalmic emulsion  ? estradiol (ESTRACE) 2 MG tablet  ? fluticasone (FLOVENT HFA) 110 MCG/ACT inhaler  ? levalbuterol (XOPENEX HFA) 45 MCG/ACT inhaler  ? levocetirizine  (XYZAL) 5 MG tablet  ? loratadine-pseudoephedrine (CLARITIN-D 24-HOUR) 10-240 MG 24 hr tablet  ? Multiple Vitamin (MULTIVITAMIN WITH MINERALS) TABS tablet  ? ondansetron (ZOFRAN) 4 MG tablet  ? rizatriptan (MAXALT) 10 MG tablet  ? topiramate (TOPAMAX) 100 MG tablet  ? ?No current facility-administered medications for this encounter.  ? ? ?If no changes, I anticipate pt can proceed with surgery as scheduled.  ? ?Willeen Cass, PhD, FNP-BC ?[facility]/Anesthesiology ?Phone: [phone] ?10/13/2021 10:12 AM ? ? ? ? ? ? ? ?

## 2021-10-14 ENCOUNTER — Other Ambulatory Visit (HOSPITAL_BASED_OUTPATIENT_CLINIC_OR_DEPARTMENT_OTHER): Payer: Self-pay

## 2021-10-14 ENCOUNTER — Ambulatory Visit (HOSPITAL_BASED_OUTPATIENT_CLINIC_OR_DEPARTMENT_OTHER): Payer: 59 | Admitting: Emergency Medicine

## 2021-10-14 ENCOUNTER — Other Ambulatory Visit: Payer: Self-pay

## 2021-10-14 ENCOUNTER — Encounter (HOSPITAL_COMMUNITY): Payer: Self-pay | Admitting: General Surgery

## 2021-10-14 ENCOUNTER — Ambulatory Visit (HOSPITAL_COMMUNITY): Payer: 59 | Admitting: Emergency Medicine

## 2021-10-14 ENCOUNTER — Encounter (HOSPITAL_COMMUNITY)
Admission: RE | Disposition: A | Discharge status: Home / Self Care | Payer: Self-pay | Source: Home / Self Care | Attending: General Surgery

## 2021-10-14 ENCOUNTER — Ambulatory Visit (HOSPITAL_COMMUNITY)
Admission: RE | Admit: 2021-10-14 | Discharge: 2021-10-14 | Disposition: A | Discharge status: Home / Self Care | Payer: 59 | Attending: General Surgery | Admitting: General Surgery

## 2021-10-14 DIAGNOSIS — M25511 Pain in right shoulder: Secondary | ICD-10-CM | POA: Insufficient documentation

## 2021-10-14 DIAGNOSIS — R800 Isolated proteinuria: Secondary | ICD-10-CM | POA: Diagnosis not present

## 2021-10-14 DIAGNOSIS — D1721 Benign lipomatous neoplasm of skin and subcutaneous tissue of right arm: Secondary | ICD-10-CM

## 2021-10-14 DIAGNOSIS — D171 Benign lipomatous neoplasm of skin and subcutaneous tissue of trunk: Secondary | ICD-10-CM | POA: Diagnosis not present

## 2021-10-14 DIAGNOSIS — K219 Gastro-esophageal reflux disease without esophagitis: Secondary | ICD-10-CM | POA: Insufficient documentation

## 2021-10-14 DIAGNOSIS — R519 Headache, unspecified: Secondary | ICD-10-CM | POA: Insufficient documentation

## 2021-10-14 DIAGNOSIS — M1990 Unspecified osteoarthritis, unspecified site: Secondary | ICD-10-CM | POA: Insufficient documentation

## 2021-10-14 DIAGNOSIS — R2231 Localized swelling, mass and lump, right upper limb: Secondary | ICD-10-CM | POA: Diagnosis present

## 2021-10-14 HISTORY — PX: LIPOMA EXCISION: SHX5283

## 2021-10-14 SURGERY — EXCISION LIPOMA
Anesthesia: General | Site: Shoulder | Laterality: Right

## 2021-10-14 MED ORDER — PROMETHAZINE HCL 25 MG/ML IJ SOLN: 6.2500 mg | INTRAMUSCULAR | Status: DC | PRN

## 2021-10-14 MED ORDER — CHLORHEXIDINE GLUCONATE 0.12 % MT SOLN
15.0000 mL | Freq: Once | OROMUCOSAL | Status: AC
  Administered 2021-10-14: 15 mL via OROMUCOSAL
  Filled 2021-10-14: qty 15

## 2021-10-14 MED ORDER — MIDAZOLAM HCL 2 MG/2ML IJ SOLN: 0.5000 mg | Freq: Once | INTRAMUSCULAR | Status: DC | PRN

## 2021-10-14 MED ORDER — BUPIVACAINE-EPINEPHRINE (PF) 0.25% -1:200000 IJ SOLN
INTRAMUSCULAR | Status: AC
  Filled 2021-10-14: qty 30

## 2021-10-14 MED ORDER — OXYCODONE HCL 5 MG PO TABS: 5.0000 mg | ORAL_TABLET | Freq: Once | ORAL | Status: DC | PRN

## 2021-10-14 MED ORDER — DEXAMETHASONE SODIUM PHOSPHATE 10 MG/ML IJ SOLN
INTRAMUSCULAR | Status: DC | PRN
  Administered 2021-10-14: 10 mg via INTRAVENOUS

## 2021-10-14 MED ORDER — HYDROMORPHONE HCL 1 MG/ML IJ SOLN: 0.2500 mg | INTRAMUSCULAR | Status: DC | PRN

## 2021-10-14 MED ORDER — 0.9 % SODIUM CHLORIDE (POUR BTL) OPTIME
TOPICAL | Status: DC | PRN
  Administered 2021-10-14: 1000 mL

## 2021-10-14 MED ORDER — CEFAZOLIN SODIUM-DEXTROSE 2-3 GM-%(50ML) IV SOLR
INTRAVENOUS | Status: DC | PRN
  Administered 2021-10-14: 2 g via INTRAVENOUS

## 2021-10-14 MED ORDER — OXYCODONE HCL 5 MG/5ML PO SOLN: 5.0000 mg | Freq: Once | ORAL | Status: DC | PRN

## 2021-10-14 MED ORDER — TRAMADOL HCL 50 MG PO TABS
50.0000 mg | ORAL_TABLET | Freq: Four times a day (QID) | ORAL | 0 refills | Status: DC | PRN
  Filled 2021-10-14: qty 10, 3d supply, fill #0

## 2021-10-14 MED ORDER — ONDANSETRON HCL 4 MG/2ML IJ SOLN
INTRAMUSCULAR | Status: DC | PRN
  Administered 2021-10-14: 4 mg via INTRAVENOUS

## 2021-10-14 MED ORDER — FENTANYL CITRATE (PF) 250 MCG/5ML IJ SOLN
INTRAMUSCULAR | Status: DC | PRN
  Administered 2021-10-14 (×3): 25 ug via INTRAVENOUS

## 2021-10-14 MED ORDER — LACTATED RINGERS IV SOLN: INTRAVENOUS | Status: DC

## 2021-10-14 MED ORDER — MIDAZOLAM HCL 2 MG/2ML IJ SOLN
INTRAMUSCULAR | Status: AC
  Filled 2021-10-14: qty 2

## 2021-10-14 MED ORDER — LIDOCAINE 2% (20 MG/ML) 5 ML SYRINGE
INTRAMUSCULAR | Status: DC | PRN
  Administered 2021-10-14: 60 mg via INTRAVENOUS

## 2021-10-14 MED ORDER — FENTANYL CITRATE (PF) 250 MCG/5ML IJ SOLN
INTRAMUSCULAR | Status: AC
  Filled 2021-10-14: qty 5

## 2021-10-14 MED ORDER — PROPOFOL 500 MG/50ML IV EMUL
INTRAVENOUS | Status: DC | PRN
  Administered 2021-10-14: 200 ug/kg/min via INTRAVENOUS

## 2021-10-14 MED ORDER — PROPOFOL 10 MG/ML IV BOLUS
INTRAVENOUS | Status: DC | PRN
  Administered 2021-10-14: 200 mg via INTRAVENOUS

## 2021-10-14 MED ORDER — BUPIVACAINE-EPINEPHRINE 0.25% -1:200000 IJ SOLN
INTRAMUSCULAR | Status: DC | PRN
  Administered 2021-10-14: 7 mL

## 2021-10-14 MED ORDER — PHENYLEPHRINE 40 MCG/ML (10ML) SYRINGE FOR IV PUSH (FOR BLOOD PRESSURE SUPPORT)
PREFILLED_SYRINGE | INTRAVENOUS | Status: DC | PRN
  Administered 2021-10-14 (×2): 80 ug via INTRAVENOUS

## 2021-10-14 MED ORDER — MEPERIDINE HCL 25 MG/ML IJ SOLN: 6.2500 mg | INTRAMUSCULAR | Status: DC | PRN

## 2021-10-14 MED ORDER — SCOPOLAMINE 1 MG/3DAYS TD PT72: 1.0000 | MEDICATED_PATCH | TRANSDERMAL | Status: DC

## 2021-10-14 MED ORDER — PROPOFOL 10 MG/ML IV BOLUS
INTRAVENOUS | Status: AC
  Filled 2021-10-14: qty 20

## 2021-10-14 MED ORDER — ORAL CARE MOUTH RINSE: 15.0000 mL | Freq: Once | OROMUCOSAL | Status: AC

## 2021-10-14 MED ORDER — AMISULPRIDE (ANTIEMETIC) 5 MG/2ML IV SOLN: 10.0000 mg | Freq: Once | INTRAVENOUS | Status: DC | PRN

## 2021-10-14 MED ORDER — ACETAMINOPHEN 500 MG PO TABS: 1000.0000 mg | ORAL_TABLET | Freq: Once | ORAL | Status: DC

## 2021-10-14 MED ORDER — MIDAZOLAM HCL 2 MG/2ML IJ SOLN
INTRAMUSCULAR | Status: DC | PRN
Start: 2021-10-14 — End: 2021-10-14
  Administered 2021-10-14: 2 mg via INTRAVENOUS

## 2021-10-14 SURGICAL SUPPLY — 30 items
BAG COUNTER SPONGE SURGICOUNT (BAG) ×2 IMPLANT
BNDG ELASTIC 4X5.8 VLCR STR LF (GAUZE/BANDAGES/DRESSINGS) ×1 IMPLANT
CANISTER SUCT 3000ML PPV (MISCELLANEOUS) IMPLANT
CHLORAPREP W/TINT 26 (MISCELLANEOUS) ×2 IMPLANT
COVER SURGICAL LIGHT HANDLE (MISCELLANEOUS) ×2 IMPLANT
DECANTER SPIKE VIAL GLASS SM (MISCELLANEOUS) IMPLANT
DERMABOND ADVANCED (GAUZE/BANDAGES/DRESSINGS) ×1
DERMABOND ADVANCED .7 DNX12 (GAUZE/BANDAGES/DRESSINGS) ×1 IMPLANT
DRAPE LAPAROTOMY 100X72 PEDS (DRAPES) ×2 IMPLANT
ELECT REM PT RETURN 9FT ADLT (ELECTROSURGICAL) ×2
ELECTRODE REM PT RTRN 9FT ADLT (ELECTROSURGICAL) ×1 IMPLANT
GAUZE SPONGE 4X4 12PLY STRL (GAUZE/BANDAGES/DRESSINGS) ×1 IMPLANT
GLOVE SURG ENC MOIS LTX SZ7.5 (GLOVE) ×1 IMPLANT
GOWN STRL REUS W/ TWL LRG LVL3 (GOWN DISPOSABLE) ×2 IMPLANT
GOWN STRL REUS W/TWL LRG LVL3 (GOWN DISPOSABLE) ×2
KIT BASIN OR (CUSTOM PROCEDURE TRAY) ×2 IMPLANT
KIT TURNOVER KIT B (KITS) ×2 IMPLANT
NDL HYPO 25GX1X1/2 BEV (NEEDLE) ×1 IMPLANT
NEEDLE HYPO 25GX1X1/2 BEV (NEEDLE) ×2 IMPLANT
NS IRRIG 1000ML POUR BTL (IV SOLUTION) ×2 IMPLANT
PACK GENERAL/GYN (CUSTOM PROCEDURE TRAY) ×2 IMPLANT
PAD ARMBOARD 7.5X6 YLW CONV (MISCELLANEOUS) ×2 IMPLANT
PENCIL SMOKE EVACUATOR (MISCELLANEOUS) ×1 IMPLANT
SPECIMEN JAR SMALL (MISCELLANEOUS) ×2 IMPLANT
SUT MNCRL AB 4-0 PS2 18 (SUTURE) ×2 IMPLANT
SUT VIC AB 3-0 SH 27 (SUTURE) ×1
SUT VIC AB 3-0 SH 27XBRD (SUTURE) ×1 IMPLANT
SYR CONTROL 10ML LL (SYRINGE) ×2 IMPLANT
TOWEL GREEN STERILE (TOWEL DISPOSABLE) ×2 IMPLANT
TOWEL GREEN STERILE FF (TOWEL DISPOSABLE) ×2 IMPLANT

## 2021-10-14 NOTE — Op Note (Signed)
10/14/2021 ? ?12:47 PM ? ?PATIENT:  Alexandra Cabrera  56 y.o. female ? ?PRE-OPERATIVE DIAGNOSIS:  RIGHT SHOULDER LIPOMA ? ?POST-OPERATIVE DIAGNOSIS:  RIGHT SHOULDER LIPOMA ? ?PROCEDURE:  Procedure(s): ?EXCISION OF RIGHT SHOULDER LIPOMA (Right) ? ?SURGEON:  Surgeon(s) and Role: ?   Jovita Kussmaul, MD - Primary ? ?PHYSICIAN ASSISTANT:  ? ?ASSISTANTS: none  ? ?ANESTHESIA:   local and general ? ?EBL:  minimal  ? ?BLOOD ADMINISTERED:none ? ?DRAINS: none  ? ?LOCAL MEDICATIONS USED:  MARCAINE    ? ?SPECIMEN:  Source of Specimen:  lipoma right shoulder ? ?DISPOSITION OF SPECIMEN:  PATHOLOGY ? ?COUNTS:  YES ? ?TOURNIQUET:  * No tourniquets in log * ? ?DICTATION: .Dragon Dictation ? ?After informed consent was obtained the patient was brought to the operating room and placed in the supine position on the operating table.  After adequate induction of general anesthesia the patient's right side was bumped up with a blanket and the right shoulder area was prepped with ChloraPrep, allowed to dry, and draped in usual sterile manner.  An appropriate timeout was performed.  There was a palpable fatty mass on the lateral aspect of the right shoulder.  The area around this was infiltrated with quarter percent Marcaine.  Longitudinal incision was made overlying the palpable mass with a 15 blade knife.  The incision was carried through the skin and subcutaneous tissue sharply with the electrocautery until the mass was encountered.  The mass had the appearance of a benign looking lipoma.  It was separated from the rest of the subcutaneous tissue by combination of blunt finger dissection and some sharp dissection with the electrocautery.  Once the entire mass was removed it measured 6 x 5 cm.  It was sent to pathology for further evaluation.  Hemostasis was achieved using the Bovie electrocautery.  The deep layer of the incision was closed with interrupted 3-0 Vicryl stitches.  The skin was then closed with a running 4-0 Monocryl  subcuticular stitch.  Dermabond dressings and an Ace wrap were applied.  The patient tolerated the procedure well.  At the end of the case all needle sponge and instrument counts were correct.  The patient was then awakened and taken to recovery in stable condition. ? ?PLAN OF CARE: Discharge to home after PACU ? ?PATIENT DISPOSITION:  PACU - hemodynamically stable. ?  ?Delay start of Pharmacological VTE agent (>24hrs) due to surgical blood loss or risk of bleeding: not applicable ? ?

## 2021-10-14 NOTE — Anesthesia Procedure Notes (Signed)
Procedure Name: LMA Insertion ?Date/Time: 10/14/2021 12:10 PM ?Performed by: Griffin Dakin, CRNA ?Pre-anesthesia Checklist: Patient identified, Emergency Drugs available, Patient being monitored, Suction available and Timeout performed ?Patient Re-evaluated:Patient Re-evaluated prior to induction ?Oxygen Delivery Method: Circle system utilized ?Preoxygenation: Pre-oxygenation with 100% oxygen ?Induction Type: IV induction ?Ventilation: Mask ventilation without difficulty ?LMA: LMA inserted ?LMA Size: 4.0 ?Placement Confirmation: positive ETCO2 and breath sounds checked- equal and bilateral ?Tube secured with: Tape ?Dental Injury: Teeth and Oropharynx as per pre-operative assessment  ? ? ? ? ?

## 2021-10-14 NOTE — H&P (Signed)
?REFERRING PHYSICIAN: Shelda Cabrera* ? ?PROVIDER: Landry Corporal, MD ? ?MRN: [id number] ?DOB: October 16, 1965 ?Subjective  ? ?Chief Complaint: Soft Tissue Mass ? ? ?History of Present Illness: ?Alexandra Cabrera is a 56 y.o. female who is seen today as an office consultation at the request of Dr. Nani Ravens for evaluation of Soft Tissue Mass ?.  ? ?We are asked to see the patient in consultation by Dr. Riki Sheer to evaluate her for a mass on the right shoulder. The patient is a 56 year old white female who has had this mass on her right shoulder for about 8 years. During that time it is gotten a little bit larger. She saw one of the orthopedic surgeons several years ago for this and was told that it extended deeper than what it seemed. We have no record of this ultrasound to review. She comes in today with complaints of some numbness in her right hand and some right shoulder tenderness. She believes it is coming from the mass itself. She is otherwise in good health and does not smoke. ? ?Review of Systems: ?A complete review of systems was obtained from the patient. I have reviewed this information and discussed as appropriate with the patient. See HPI as well for other ROS. ? ?ROS  ? ?Medical History: ?History reviewed. No pertinent past medical history. ? ?Patient Active Problem List  ?Diagnosis  ? Lipoma of shoulder  ? ?History reviewed. No pertinent surgical history.  ? ?Allergies  ?Allergen Reactions  ? Latex Anaphylaxis  ? Promethazine Hcl Other (See Comments)  ?Shaky, tremors  ? Metoclopramide Anxiety  ? ?Current Outpatient Medications on File Prior to Visit  ?Medication Sig Dispense Refill  ? estradioL (ESTRACE) 2 MG tablet Take 1 tablet by mouth once daily  ? topiramate (TOPAMAX) 100 MG tablet Take 1 tablet by mouth 2 (two) times daily  ? ibuprofen (MOTRIN) 800 MG tablet  ? ?No current facility-administered medications on file prior to visit.  ? ?History reviewed. No pertinent family history.   ? ?Social History  ? ?Tobacco Use  ?Smoking Status Never  ?Smokeless Tobacco Never  ? ? ?Social History  ? ?Socioeconomic History  ? Marital status: Married  ?Tobacco Use  ? Smoking status: Never  ? Smokeless tobacco: Never  ?Substance and Sexual Activity  ? Alcohol use: Yes  ? Drug use: Never  ? ?Objective:  ? ?Vitals:  ?BP: 118/78  ?Pulse: 79  ?Temp: 36.8 ?C (98.2 ?F)  ?SpO2: 99%  ?Weight: 52.1 kg (114 lb 12.8 oz)  ?Height: 151.1 cm (4' 11.5")  ? ?Body mass index is 22.8 kg/m?. ? ?Physical Exam ?Vitals reviewed.  ?Constitutional:  ?General: She is not in acute distress. ?Appearance: Normal appearance.  ?HENT:  ?Head: Normocephalic and atraumatic.  ?Right Ear: External ear normal.  ?Left Ear: External ear normal.  ?Nose: Nose normal.  ?Mouth/Throat:  ?Mouth: Mucous membranes are moist.  ?Pharynx: Oropharynx is clear.  ?Eyes:  ?General: No scleral icterus. ?Extraocular Movements: Extraocular movements intact.  ?Conjunctiva/sclera: Conjunctivae normal.  ?Pupils: Pupils are equal, round, and reactive to light.  ?Cardiovascular:  ?Rate and Rhythm: Normal rate and regular rhythm.  ?Pulses: Normal pulses.  ?Heart sounds: Normal heart sounds.  ?Pulmonary:  ?Effort: Pulmonary effort is normal. No respiratory distress.  ?Breath sounds: Normal breath sounds.  ?Abdominal:  ?General: Bowel sounds are normal.  ?Palpations: Abdomen is soft.  ?Tenderness: There is no abdominal tenderness.  ?Musculoskeletal:  ?General: No swelling, tenderness or deformity. Normal range of motion.  ?Cervical back:  Normal range of motion and neck supple.  ?Skin: ?General: Skin is warm and dry.  ?Coloration: Skin is not jaundiced.  ?Comments: There is a 5 to 6 cm fatty feeling mass that seems to be isolated in the subcutaneous tissue of the right outer shoulder area. There are no overlying skin changes.  ?Neurological:  ?General: No focal deficit present.  ?Mental Status: She is alert and oriented to person, place, and time.  ?Psychiatric:  ?Mood  and Affect: Mood normal.  ?Behavior: Behavior normal.  ? ? ? ?Labs, Imaging and Diagnostic Testing: ? ?Assessment and Plan:  ? ?Diagnoses and all orders for this visit: ? ?Lipoma of shoulder ?- CT upper extremity right with and without contrast; Future ? ? ? ?The patient has what appears to be a lipoma in the outer right shoulder area. Because it seems to be causing her some symptoms of discomfort and numbness she would like to have this removed and I feel like this is a very reasonable thing to do. I have discussed with her in detail the risks and benefits of the operation as well as some of the technical aspects and she understands and wishes to proceed. Given her history of another surgeon telling her this was deeper than it appeared I think it would be reasonable to evaluate this with a CT scan to make sure that we have an accurate assessment of the size and depth of it. We will call her with the results of the study and then begin surgical planning if appropriate. I have also discussed with her the possibility that some of the numbness in her hand could be coming from a pinched nerve in her neck and removing the mass will likely not change her symptoms. She understands this but would like to start with removing the mass and then see how she feels. ?

## 2021-10-14 NOTE — Interval H&P Note (Signed)
History and Physical Interval Note: ? ?10/14/2021 ?11:42 AM ? ?Alexandra Cabrera  has presented today for surgery, with the diagnosis of RIGHT SHOULDER LIPOMA.  The various methods of treatment have been discussed with the patient and family. After consideration of risks, benefits and other options for treatment, the patient has consented to  Procedure(s): ?EXCISION OF RIGHT SHOULDER LIPOMA (Right) as a surgical intervention.  The patient's history has been reviewed, patient examined, no change in status, stable for surgery.  I have reviewed the patient's chart and labs.  Questions were answered to the patient's satisfaction.   ? ? ?[name] ? ? ?

## 2021-10-14 NOTE — Transfer of Care (Signed)
Immediate Anesthesia Transfer of Care Note ? ?Patient: Alexandra Cabrera ? ?Procedure(s) Performed: EXCISION OF RIGHT SHOULDER LIPOMA (Right: Shoulder) ? ?Patient Location: PACU ? ?Anesthesia Type:General ? ?Level of Consciousness: awake, alert  and oriented ? ?Airway & Oxygen Therapy: Patient Spontanous Breathing ? ?Post-op Assessment: Report given to RN and Post -op Vital signs reviewed and stable ? ?Post vital signs: Reviewed and stable ? ?Last Vitals:  ?Vitals Value Taken Time  ?BP 95/49 10/14/21 1257  ?Temp    ?Pulse 79 10/14/21 1300  ?Resp 14 10/14/21 1300  ?SpO2 99 % 10/14/21 1300  ?Vitals shown include unvalidated device data. ? ?Last Pain:  ?Vitals:  ? 10/14/21 1010  ?TempSrc:   ?PainSc: 0-No pain  ?   ? ?  ? ?Complications: No notable events documented. ?

## 2021-10-17 ENCOUNTER — Encounter (HOSPITAL_COMMUNITY): Payer: Self-pay | Admitting: General Surgery

## 2021-10-17 LAB — SURGICAL PATHOLOGY

## 2021-10-17 NOTE — Anesthesia Postprocedure Evaluation (Signed)
Anesthesia Post Note ? ?Patient: SHER SHAMPINE ? ?Procedure(s) Performed: EXCISION OF RIGHT SHOULDER LIPOMA (Right: Shoulder) ? ?  ? ?Patient location during evaluation: PACU ?Anesthesia Type: General ?Level of consciousness: awake and alert ?Pain management: pain level controlled ?Vital Signs Assessment: post-procedure vital signs reviewed and stable ?Respiratory status: spontaneous breathing, nonlabored ventilation and respiratory function stable ?Cardiovascular status: blood pressure returned to baseline and stable ?Postop Assessment: no apparent nausea or vomiting ?Anesthetic complications: no ? ? ?No notable events documented. ? ?Last Vitals:  ?Vitals:  ? 10/14/21 1309 10/14/21 1324  ?BP: (!) 97/59 101/76  ?Pulse: 77 74  ?Resp: 19 16  ?Temp:  36.6 ?C  ?SpO2: 100% 97%  ?  ?Last Pain:  ?Vitals:  ? 10/14/21 1324  ?TempSrc:   ?PainSc: 0-No pain  ? ? ?  ?  ?  ?  ?  ?  ? ?Lynda Rainwater ? ? ? ? ?

## 2021-10-27 ENCOUNTER — Other Ambulatory Visit (HOSPITAL_BASED_OUTPATIENT_CLINIC_OR_DEPARTMENT_OTHER): Payer: Self-pay

## 2021-10-27 MED ORDER — DOXYCYCLINE HYCLATE 100 MG PO CAPS
ORAL_CAPSULE | ORAL | 0 refills | Status: DC
  Filled 2021-10-27: qty 14, 7d supply, fill #0

## 2021-11-14 ENCOUNTER — Other Ambulatory Visit (HOSPITAL_BASED_OUTPATIENT_CLINIC_OR_DEPARTMENT_OTHER): Payer: Self-pay

## 2021-11-14 MED ORDER — AMOXICILLIN 500 MG PO CAPS
ORAL_CAPSULE | ORAL | 4 refills | Status: DC
  Filled 2021-11-14: qty 4, 1d supply, fill #0

## 2021-11-22 ENCOUNTER — Other Ambulatory Visit (HOSPITAL_BASED_OUTPATIENT_CLINIC_OR_DEPARTMENT_OTHER): Payer: Self-pay

## 2021-11-24 ENCOUNTER — Other Ambulatory Visit (HOSPITAL_BASED_OUTPATIENT_CLINIC_OR_DEPARTMENT_OTHER): Payer: Self-pay

## 2022-01-03 ENCOUNTER — Other Ambulatory Visit: Payer: Self-pay | Admitting: Family Medicine

## 2022-01-03 ENCOUNTER — Other Ambulatory Visit (HOSPITAL_BASED_OUTPATIENT_CLINIC_OR_DEPARTMENT_OTHER): Payer: Self-pay

## 2022-01-04 ENCOUNTER — Encounter: Payer: Self-pay | Admitting: Family Medicine

## 2022-01-04 ENCOUNTER — Other Ambulatory Visit (HOSPITAL_BASED_OUTPATIENT_CLINIC_OR_DEPARTMENT_OTHER): Payer: Self-pay

## 2022-01-04 MED ORDER — IBUPROFEN 800 MG PO TABS
ORAL_TABLET | ORAL | 5 refills | Status: DC
  Filled 2022-01-04: qty 30, 8d supply, fill #0

## 2022-01-04 MED ORDER — ESTRADIOL 2 MG PO TABS
ORAL_TABLET | Freq: Every day | ORAL | 0 refills | Status: DC
  Filled 2022-01-04: qty 90, 90d supply, fill #0

## 2022-01-18 ENCOUNTER — Encounter: Payer: Self-pay | Admitting: Family Medicine

## 2022-01-18 ENCOUNTER — Other Ambulatory Visit (HOSPITAL_BASED_OUTPATIENT_CLINIC_OR_DEPARTMENT_OTHER): Payer: Self-pay

## 2022-01-18 ENCOUNTER — Ambulatory Visit (INDEPENDENT_AMBULATORY_CARE_PROVIDER_SITE_OTHER): Payer: 59 | Admitting: Family Medicine

## 2022-01-18 VITALS — BP 120/82 | HR 58 | Temp 97.9°F | Ht 59.0 in | Wt 117.1 lb

## 2022-01-18 DIAGNOSIS — G43909 Migraine, unspecified, not intractable, without status migrainosus: Secondary | ICD-10-CM

## 2022-01-18 DIAGNOSIS — Z0000 Encounter for general adult medical examination without abnormal findings: Secondary | ICD-10-CM

## 2022-01-18 MED ORDER — TOPIRAMATE 50 MG PO TABS
150.0000 mg | ORAL_TABLET | Freq: Two times a day (BID) | ORAL | 2 refills | Status: DC
  Filled 2022-01-18: qty 540, 90d supply, fill #0
  Filled 2022-03-12 – 2022-03-15 (×3): qty 540, 90d supply, fill #1

## 2022-01-18 NOTE — Progress Notes (Signed)
Chief Complaint  Patient presents with   Annual Exam    Wants to start back getting sumatriptan as well as other migraine medications.     Well Woman Alexandra Cabrera is here for a complete physical.   Her last physical was >1 year ago.  Current diet: in general, a "healthy" diet. Current exercise: walking. Weight is stable and she denies fatigue out of ordinary. Seatbelt? Yes Advanced directive? Yes  Health Maintenance Pap/HPV- Yes Mammogram- Yes Colon cancer screening-Due Shingrix- No Tetanus- Yes Hep C screening- Yes HIV screening- Yes  Patient has a history of migraines for which she takes Topamax 100 mg twice daily for.  She uses rizatriptan and sumatriptan for abortive therapy.  She usually requires more than 10 pills/month.  Her husband has left her and the weather has been changing frequently.  Stress and pressure changes both contribute to headaches.  She used to be on 400 mg of Topamax in the morning and 200 mg of Topamax in the evening when she saw neurology.  She weaned herself down as it caused her to have word finding difficulties.  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, I have never had that problem"; (patient had sevo and succinylcholine with no apparent complication on 22/97/98 at [facility])   Fibula fracture 07/23/2018   Distal fibular fracture   GERD (gastroesophageal reflux disease)    Hemorrhoids 02/04/2018   noted on Colonoscopy   History of appendicitis    History of colon polyps 02/04/2018   History of gallstones    History of kidney stones    Hyperlipidemia, mixed 05/17/2016   Patient denies   IBS (irritable bowel  syndrome) 01/03/2015   IC (interstitial cystitis)    Migraines    Pelvic pain    PONV (postoperative nausea and vomiting)    "difficult to wake up."   Preventative health care 01/03/2015   Rash and nonspecific skin eruption 11/01/2015   Seasonal allergies      Past Surgical History:  Procedure Laterality Date   ABDOMINAL HYSTERECTOMY     APPENDECTOMY     CHOLECYSTECTOMY N/A 10/12/2014   Procedure: LAPAROSCOPIC CHOLECYSTECTOMY WITH INTRAOPERATIVE CHOLANGIOGRAM;  Surgeon: Doreen Salvage, MD;  Location: [place];  Service: General;  Laterality: N/A;   COLONOSCOPY  02/04/2018   CYSTOSCOPY WITH RETROGRADE PYELOGRAM, URETEROSCOPY AND STENT PLACEMENT Right 08/04/2019   Procedure: CYSTOSCOPY WITH RETROGRADE PYELOGRAM, RIGHT URETEROSCOPY, BASKET STONE EXTRACTION;  Surgeon: Irine Seal, MD;  Location: [facility];  Service: Urology;  Laterality: Right;   HEMORROIDECTOMY     young adult   LIPOMA EXCISION Right 10/14/2021   Procedure: EXCISION OF RIGHT SHOULDER LIPOMA;  Surgeon: Jovita Kussmaul, MD;  Location: [place];  Service: General;  Laterality: Right;   ORIF ANKLE FRACTURE Left 07/30/2018   Procedure: OPEN REDUCTION INTERNAL FIXATION (ORIF) LEFT ANKLE FRACTURE;  Surgeon: Renette Butters, MD;  Location: [place];  Service: Orthopedics;  Laterality: Left;   PARTIAL PROCTECTOMY BY TEM N/A 05/29/2018   Procedure: PARTIAL PROCTECTOMY BY TEM EXCISION OF ANAL TAG;  Surgeon: Michael Boston, MD;  Location: [facility];  Service: General;  Laterality: N/A;   TOTAL KNEE ARTHROPLASTY     1 knee replacements, 7 knee revision, 28 surgeries on right knee    Medications  Current Outpatient Medications on File Prior to Visit  Medication Sig Dispense Refill   amoxicillin (AMOXIL) 500 MG capsule Take 4 capsules by mouth 1 hour prior to dental appointment 4 capsule 4   cycloSPORINE (RESTASIS) 0.05 % ophthalmic emulsion Instill 1 drop into both eyes twice a day as directed (Patient taking differently: Place 1 drop into  both eyes at bedtime as needed (dry eyes).) 180 mL 6   estradiol (ESTRACE) 2 MG tablet Take 1 tablet by mouth daily 90 tablet 0   fluticasone (FLOVENT HFA) 110 MCG/ACT inhaler Inhale 2 puffs into the lungs in the morning and at bedtime. Rinse mouth out after use. (Patient taking differently: Inhale 2 puffs into the lungs 2 (two) times daily as needed (wheezing or shortness of breath). Rinse mouth out after use.) 12 g 1   ibuprofen (ADVIL) 800 MG tablet TAKE 1 TABLET BY MOUTH AT ONSET OF MIGRAINE MAY REPEAT IN 6 HOURS AS NEEDED. 30 tablet 5   levalbuterol (XOPENEX HFA) 45 MCG/ACT inhaler Inhale 1-2 puffs into the lungs every 6 (six) hours as needed for wheezing or shortness of breath. 15 g 12   loratadine-pseudoephedrine (CLARITIN-D 24-HOUR) 10-240 MG 24 hr tablet Take 1 tablet by mouth daily. (Patient taking differently: Take 1 tablet by mouth daily as needed for allergies.) 30 tablet 3   Multiple Vitamin (MULTIVITAMIN WITH MINERALS) TABS tablet Take 1 tablet by mouth daily.     ondansetron (ZOFRAN) 4 MG tablet Take 1 tablet (4 mg total) by mouth every 8 (eight) hours as needed. 20 tablet 2   rizatriptan (MAXALT) 10 MG tablet Take 1 tablet (10 mg total) by mouth as needed for migraine. 10 tablet 6    Allergies Allergies  Allergen Reactions   Latex Anaphylaxis   Phenergan [Promethazine Hcl] Other (See Comments)    Shaky, tremors   Reglan [Metoclopramide] Anxiety    Review of Systems: Constitutional:  no unexpected weight changes Eye:  no recent significant change in vision Ear/Nose/Mouth/Throat:  Ears:  no recent change in hearing Nose/Mouth/Throat:  no complaints of nasal congestion, no sore throat Cardiovascular: no chest pain Respiratory:  no shortness of breath Gastrointestinal:  no abdominal pain, no change in bowel habits GU:  Female: negative for dysuria or pelvic pain Musculoskeletal/Extremities:  no pain of the joints Integumentary (Skin/Breast):  no abnormal skin lesions  reported Neurologic:  + headaches Endocrine:  denies fatigue  Exam BP 120/82   Pulse (!) 58   Temp 97.9 F (36.6 C) (Oral)   Ht '4\' 11"'$  (1.499 m)   Wt 117 lb 2 oz (53.1 kg)   SpO2 98%   BMI 23.66 kg/m  General:  well developed, well nourished, in no apparent distress Skin:  no significant moles, warts, or growths Head:  no masses, lesions, or tenderness Eyes:  pupils equal and round, sclera anicteric without injection Ears:  canals without lesions, TMs shiny without retraction, no obvious effusion, no erythema Nose:  nares patent, septum midline, mucosa normal, and no drainage or sinus tenderness Throat/Pharynx:  lips and gingiva without lesion; tongue and uvula midline; non-inflamed pharynx; no exudates or postnasal drainage Neck: neck supple without adenopathy, thyromegaly, or masses Lungs:  clear to auscultation, breath sounds equal bilaterally, no respiratory distress Cardio:  regular rate and rhythm, no LE edema Abdomen:  abdomen soft,  nontender; bowel sounds normal; no masses or organomegaly Genital: Defer to GYN Musculoskeletal:  symmetrical muscle groups noted without atrophy or deformity Extremities:  no clubbing, cyanosis, or edema, no deformities, no skin discoloration Neuro:  gait normal; deep tendon reflexes normal and symmetric Psych: well oriented with normal range of affect and appropriate judgment/insight  Assessment and Plan  Well adult exam - Plan: CBC, Comprehensive metabolic panel, Lipid panel, CBC, Comprehensive metabolic panel, Lipid panel  Migraine without status migrainosus, not intractable, unspecified migraine type - Plan: topiramate (TOPAMAX) 50 MG tablet   Well 56 y.o. female. Counseled on diet and exercise. Other orders as above. CCS rec'd.  Shingrix rec'd.  Migraines: Chronic, not controlled.  Increase Topamax to 150 mg daily in the morning and 100 mg in the evening for 3 days and then increase to 150 mg twice daily.  Continue rizatriptan as  needed, would hold off on adding more abortive therapy to avoid medication overuse headaches. Follow up in 6 months or as needed. The patient voiced understanding and agreement to the plan.  [place], DO 01/18/22 1:47 PM

## 2022-01-18 NOTE — Patient Instructions (Signed)
Give us 2-3 business days to get the results of your labs back.   Keep the diet clean and stay active.  Let us know if you need anything. 

## 2022-01-19 LAB — COMPREHENSIVE METABOLIC PANEL
ALT: 20 U/L (ref 0–35)
AST: 28 U/L (ref 0–37)
Albumin: 4.1 g/dL (ref 3.5–5.2)
Alkaline Phosphatase: 60 U/L (ref 39–117)
BUN: 23 mg/dL (ref 6–23)
CO2: 26 mEq/L (ref 19–32)
Calcium: 9.1 mg/dL (ref 8.4–10.5)
Chloride: 109 mEq/L (ref 96–112)
Creatinine, Ser: 0.92 mg/dL (ref 0.40–1.20)
GFR: 69.32 mL/min (ref >60.00)
Glucose, Bld: 84 mg/dL (ref 70–99)
Potassium: 4 mEq/L (ref 3.5–5.1)
Sodium: 141 mEq/L (ref 135–145)
Total Bilirubin: 0.3 mg/dL (ref 0.2–1.2)
Total Protein: 6.3 g/dL (ref 6.0–8.3)

## 2022-01-19 LAB — LIPID PANEL
Cholesterol: 195 mg/dL (ref 0–200)
HDL: 78.3 mg/dL (ref >39.00)
LDL Cholesterol: 105 mg/dL — ABNORMAL HIGH (ref 0–99)
NonHDL: 116.69
Total CHOL/HDL Ratio: 2
Triglycerides: 58 mg/dL (ref 0.0–149.0)
VLDL: 11.6 mg/dL (ref 0.0–40.0)

## 2022-01-19 LAB — CBC
HCT: 41 % (ref 36.0–46.0)
Hemoglobin: 13.6 g/dL (ref 12.0–15.0)
MCHC: 33.2 g/dL (ref 30.0–36.0)
MCV: 97.2 fl (ref 78.0–100.0)
Platelets: 256 10*3/uL (ref 150.0–400.0)
RBC: 4.22 Mil/uL (ref 3.87–5.11)
RDW: 12.5 % (ref 11.5–15.5)
WBC: 7.5 10*3/uL (ref 4.0–10.5)

## 2022-02-20 ENCOUNTER — Other Ambulatory Visit (HOSPITAL_BASED_OUTPATIENT_CLINIC_OR_DEPARTMENT_OTHER): Payer: Self-pay

## 2022-02-20 ENCOUNTER — Other Ambulatory Visit: Payer: Self-pay | Admitting: Family Medicine

## 2022-02-20 MED ORDER — LEVALBUTEROL TARTRATE 45 MCG/ACT IN AERO
1.0000 | INHALATION_SPRAY | Freq: Four times a day (QID) | RESPIRATORY_TRACT | 12 refills | Status: AC | PRN
  Filled 2022-02-20: qty 15, 25d supply, fill #0

## 2022-02-21 ENCOUNTER — Other Ambulatory Visit (HOSPITAL_BASED_OUTPATIENT_CLINIC_OR_DEPARTMENT_OTHER): Payer: Self-pay

## 2022-02-27 ENCOUNTER — Other Ambulatory Visit (HOSPITAL_BASED_OUTPATIENT_CLINIC_OR_DEPARTMENT_OTHER): Payer: Self-pay

## 2022-03-12 ENCOUNTER — Other Ambulatory Visit: Payer: Self-pay | Admitting: Family Medicine

## 2022-03-13 ENCOUNTER — Other Ambulatory Visit (HOSPITAL_BASED_OUTPATIENT_CLINIC_OR_DEPARTMENT_OTHER): Payer: Self-pay

## 2022-03-13 MED ORDER — ESTRADIOL 2 MG PO TABS
ORAL_TABLET | Freq: Every day | ORAL | 0 refills | Status: DC
  Filled 2022-03-13: qty 90, fill #0
  Filled 2022-03-15: qty 90, 90d supply, fill #0

## 2022-03-15 ENCOUNTER — Other Ambulatory Visit (HOSPITAL_BASED_OUTPATIENT_CLINIC_OR_DEPARTMENT_OTHER): Payer: Self-pay

## 2022-03-15 ENCOUNTER — Other Ambulatory Visit: Payer: Self-pay | Admitting: Family Medicine

## 2022-03-15 ENCOUNTER — Encounter: Payer: Self-pay | Admitting: Family Medicine

## 2022-03-15 DIAGNOSIS — G43909 Migraine, unspecified, not intractable, without status migrainosus: Secondary | ICD-10-CM

## 2022-03-15 MED ORDER — TOPIRAMATE 100 MG PO TABS
100.0000 mg | ORAL_TABLET | Freq: Two times a day (BID) | ORAL | 2 refills | Status: DC
  Filled 2022-03-15 – 2022-05-07 (×3): qty 180, 90d supply, fill #0
  Filled 2022-07-13: qty 180, 90d supply, fill #1
  Filled 2022-10-25: qty 180, 90d supply, fill #2

## 2022-03-15 NOTE — Telephone Encounter (Signed)
Ok to refill A mychart message was sent

## 2022-03-17 ENCOUNTER — Other Ambulatory Visit (HOSPITAL_BASED_OUTPATIENT_CLINIC_OR_DEPARTMENT_OTHER): Payer: Self-pay

## 2022-03-21 ENCOUNTER — Other Ambulatory Visit (HOSPITAL_BASED_OUTPATIENT_CLINIC_OR_DEPARTMENT_OTHER): Payer: Self-pay

## 2022-05-08 ENCOUNTER — Other Ambulatory Visit (HOSPITAL_BASED_OUTPATIENT_CLINIC_OR_DEPARTMENT_OTHER): Payer: Self-pay

## 2022-07-13 ENCOUNTER — Other Ambulatory Visit: Payer: Self-pay

## 2022-07-13 ENCOUNTER — Other Ambulatory Visit: Payer: Self-pay | Admitting: Family Medicine

## 2022-07-13 ENCOUNTER — Other Ambulatory Visit (HOSPITAL_BASED_OUTPATIENT_CLINIC_OR_DEPARTMENT_OTHER): Payer: Self-pay

## 2022-07-13 ENCOUNTER — Ambulatory Visit: Payer: 59 | Admitting: Family

## 2022-07-13 VITALS — BP 126/82 | HR 69 | Temp 97.8°F | Ht 59.0 in | Wt 111.6 lb

## 2022-07-13 DIAGNOSIS — Z23 Encounter for immunization: Secondary | ICD-10-CM | POA: Diagnosis not present

## 2022-07-13 DIAGNOSIS — W5503XA Scratched by cat, initial encounter: Secondary | ICD-10-CM | POA: Diagnosis not present

## 2022-07-13 DIAGNOSIS — S60511A Abrasion of right hand, initial encounter: Secondary | ICD-10-CM | POA: Diagnosis not present

## 2022-07-13 MED ORDER — SULFAMETHOXAZOLE-TRIMETHOPRIM 800-160 MG PO TABS
1.0000 | ORAL_TABLET | Freq: Two times a day (BID) | ORAL | 0 refills | Status: AC
  Filled 2022-07-13: qty 14, 7d supply, fill #0

## 2022-07-13 MED ORDER — ESTRADIOL 2 MG PO TABS
2.0000 mg | ORAL_TABLET | Freq: Every day | ORAL | 0 refills | Status: DC
  Filled 2022-07-13: qty 90, 90d supply, fill #0

## 2022-07-13 NOTE — Progress Notes (Signed)
Alexandra Cabrera is a 56 y.o. 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 05/29/2018 05/29/2018   Seasonal allergies 01/07/2018   Right leg pain 03/19/2017   Hyperlipidemia, mixed 05/17/2016   Acne 11/01/2015   Rash and nonspecific skin eruption 11/01/2015   IBS (irritable bowel syndrome) 01/03/2015   Preventative health care 01/03/2015   S/P hysterectomy 09/09/2012   Menopause 09/09/2012   Hot flushes, perimenopausal 09/09/2012   Family history of breast cancer in female 09/09/2012   Chronic interstitial cystitis 08/10/2011   Migraine without status migrainosus, not intractable 09/24/2007   ENDOMETRIOSIS 09/24/2007   ARTHRITIS 09/24/2007   NEPHROLITHIASIS, HX OF 09/24/2007    Current Outpatient Medications  Medication Sig Dispense Refill   amoxicillin (AMOXIL) 500 MG capsule Take 4 capsules by mouth 1 hour prior to dental appointment 4 capsule 4   cycloSPORINE (RESTASIS) 0.05 % ophthalmic emulsion Instill 1 drop into both eyes twice a day as directed (Patient taking differently: Place 1 drop into both eyes at bedtime as needed (dry eyes).) 180 mL 6   estradiol (ESTRACE) 2 MG tablet Take 1 tablet by mouth daily 90 tablet 0   fluticasone (FLOVENT HFA) 110 MCG/ACT inhaler Inhale 2 puffs into the lungs in the morning and at bedtime. Rinse mouth out after use. (Patient taking differently: Inhale 2 puffs into the lungs 2 (two) times daily as needed (wheezing or shortness of breath). Rinse mouth out after use.) 12 g 1   ibuprofen (ADVIL) 800 MG tablet TAKE 1 TABLET BY MOUTH AT ONSET OF MIGRAINE MAY REPEAT IN 6 HOURS AS NEEDED. 30 tablet 5   levalbuterol (XOPENEX HFA) 45 MCG/ACT inhaler Inhale 1-2 puffs by mouth into the lungs every 6 (six) hours as needed for wheezing or shortness of breath. 15 g 12   loratadine-pseudoephedrine (CLARITIN-D 24-HOUR)  10-240 MG 24 hr tablet Take 1 tablet by mouth daily. (Patient taking differently: Take 1 tablet by mouth daily as needed for allergies.) 30 tablet 3   Multiple Vitamin (MULTIVITAMIN WITH MINERALS) TABS tablet Take 1 tablet by mouth daily.     rizatriptan (MAXALT) 10 MG tablet Take 1 tablet (10 mg total) by mouth as needed for migraine. 10 tablet 6   sulfamethoxazole-trimethoprim (BACTRIM DS) 800-160 MG tablet Take 1 tablet by mouth 2 (two) times daily for 7 days. 14 tablet 0   topiramate (TOPAMAX) 100 MG tablet Take 1 tablet (100 mg total) by mouth 2 (two) times daily. 180 tablet 2   No current facility-administered medications for this visit.    Allergies: Latex, Phenergan [promethazine hcl], and Reglan [metoclopramide]  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, I have never had that problem"; (patient had sevo and succinylcholine with no apparent complication on 70/62/37 at [facility])   Fibula fracture 07/23/2018   Distal fibular fracture   GERD (gastroesophageal reflux disease)    Hemorrhoids 02/04/2018   noted on Colonoscopy   History of appendicitis    History of colon  polyps 02/04/2018   History of gallstones    History of kidney stones    Hyperlipidemia, mixed 05/17/2016   Patient denies   IBS (irritable bowel syndrome) 01/03/2015   IC (interstitial cystitis)    Migraines    Pelvic pain    PONV (postoperative nausea and vomiting)    "difficult to wake up."   Preventative health care 01/03/2015   Rash and nonspecific skin eruption 11/01/2015   Seasonal allergies     Past Surgical History:  Procedure Laterality Date   ABDOMINAL HYSTERECTOMY      APPENDECTOMY     CHOLECYSTECTOMY N/A 10/12/2014   Procedure: LAPAROSCOPIC CHOLECYSTECTOMY WITH INTRAOPERATIVE CHOLANGIOGRAM;  Surgeon: Doreen Salvage, MD;  Location: [place];  Service: General;  Laterality: N/A;   COLONOSCOPY  02/04/2018   CYSTOSCOPY WITH RETROGRADE PYELOGRAM, URETEROSCOPY AND STENT PLACEMENT Right 08/04/2019   Procedure: CYSTOSCOPY WITH RETROGRADE PYELOGRAM, RIGHT URETEROSCOPY, BASKET STONE EXTRACTION;  Surgeon: Irine Seal, MD;  Location: [facility];  Service: Urology;  Laterality: Right;   HEMORROIDECTOMY     young adult   LIPOMA EXCISION Right 10/14/2021   Procedure: EXCISION OF RIGHT SHOULDER LIPOMA;  Surgeon: Jovita Kussmaul, MD;  Location: [place];  Service: General;  Laterality: Right;   ORIF ANKLE FRACTURE Left 07/30/2018   Procedure: OPEN REDUCTION INTERNAL FIXATION (ORIF) LEFT ANKLE FRACTURE;  Surgeon: Renette Butters, MD;  Location: [place];  Service: Orthopedics;  Laterality: Left;   PARTIAL PROCTECTOMY BY TEM N/A 05/29/2018   Procedure: PARTIAL PROCTECTOMY BY TEM EXCISION OF ANAL TAG;  Surgeon: Michael Boston, MD;  Location: [facility];  Service: General;  Laterality: N/A;   TOTAL KNEE ARTHROPLASTY     1 knee replacements, 7 knee revision, 28 surgeries on right knee    Family History  Problem Relation Age of Onset   Diabetes Mother    Heart disease Mother        MI   Arthritis Father    COPD Father        smoker   Hypertension Father    Hyperlipidemia Father    Arthritis Sister    Arthritis Brother    Cancer Maternal Grandmother 28       ovarian   Arthritis Paternal Grandmother    Heart disease Paternal Grandfather        MI   Colon cancer Neg Hx    Esophageal cancer Neg Hx    Liver cancer Neg Hx    Pancreatic cancer Neg Hx    Rectal cancer Neg Hx    Stomach cancer Neg Hx     Social History   Tobacco Use   Smoking status: Never   Smokeless tobacco: Never  Substance Use Topics   Alcohol use: No    Subjective:  Patient presents with  concerns for cat scratch/ secondary swelling of finger as result; was playing with her mother's outdoor barn cat yesterday and accidentally got scratched; cat is up to date on vaccines but understandably patient is concerned about risk of infection due to cat living outside; She did clean wound immediately after injury yesterday;     Objective:  Vitals:   07/13/22 0949  BP: 126/82  Pulse: 69  Temp: 97.8 F (36.6 C)  TempSrc: Oral  SpO2: 100%  Weight: 111 lb 9.6 oz (50.6 kg)  Height: '4\' 11"'$  (1.499 m)    General: Well developed, well nourished, in no acute distress  Skin : Warm and dry. Small puncture wound right 3rd finger with  swelling noted at base of finger; full ROM noted; no warmth or streaking;  Head: Normocephalic and atraumatic  Lungs: Respirations unlabored;  Neurologic: Alert and oriented; speech intact; face symmetrical; moves all extremities well; CNII-XII intact without focal deficit   Assessment:  1. Cat scratch of right hand, initial encounter   2. Need for Tdap vaccination     Plan:  Physical exam is reassuring; will treat with Bactrim DS bid to cover for staph infection; cat is up to date on necessary vaccines; Tdap is updated; follow up worse,no better.   No follow-ups on file.  Orders Placed This Encounter  Procedures   Tdap vaccine greater than or equal to 7yo IM    Requested Prescriptions   Signed Prescriptions Disp Refills   sulfamethoxazole-trimethoprim (BACTRIM DS) 800-160 MG tablet 14 tablet 0    Sig: Take 1 tablet by mouth 2 (two) times daily for 7 days.

## 2022-07-14 ENCOUNTER — Other Ambulatory Visit (HOSPITAL_BASED_OUTPATIENT_CLINIC_OR_DEPARTMENT_OTHER): Payer: Self-pay

## 2022-07-14 ENCOUNTER — Other Ambulatory Visit: Payer: Self-pay | Admitting: Family Medicine

## 2022-07-14 DIAGNOSIS — W5503XA Scratched by cat, initial encounter: Secondary | ICD-10-CM | POA: Diagnosis not present

## 2022-07-14 DIAGNOSIS — S60511A Abrasion of right hand, initial encounter: Secondary | ICD-10-CM | POA: Diagnosis not present

## 2022-07-14 DIAGNOSIS — G43909 Migraine, unspecified, not intractable, without status migrainosus: Secondary | ICD-10-CM

## 2022-07-14 DIAGNOSIS — Z23 Encounter for immunization: Secondary | ICD-10-CM | POA: Diagnosis not present

## 2022-07-14 MED ORDER — RIZATRIPTAN BENZOATE 10 MG PO TABS
10.0000 mg | ORAL_TABLET | ORAL | 6 refills | Status: DC | PRN
  Filled 2022-07-14: qty 10, 17d supply, fill #0
  Filled 2022-09-06: qty 10, 17d supply, fill #1
  Filled 2022-10-25: qty 10, 17d supply, fill #2
  Filled 2022-12-29: qty 10, 17d supply, fill #3
  Filled 2023-03-02: qty 10, 17d supply, fill #4
  Filled 2023-04-19: qty 10, 17d supply, fill #5
  Filled 2023-05-28: qty 10, 17d supply, fill #6

## 2022-08-04 ENCOUNTER — Encounter (HOSPITAL_BASED_OUTPATIENT_CLINIC_OR_DEPARTMENT_OTHER): Payer: Self-pay | Admitting: Emergency Medicine

## 2022-08-04 ENCOUNTER — Emergency Department (HOSPITAL_BASED_OUTPATIENT_CLINIC_OR_DEPARTMENT_OTHER): Payer: Commercial Managed Care - PPO

## 2022-08-04 ENCOUNTER — Other Ambulatory Visit (HOSPITAL_BASED_OUTPATIENT_CLINIC_OR_DEPARTMENT_OTHER): Payer: Self-pay

## 2022-08-04 ENCOUNTER — Other Ambulatory Visit: Payer: Self-pay

## 2022-08-04 ENCOUNTER — Emergency Department (HOSPITAL_BASED_OUTPATIENT_CLINIC_OR_DEPARTMENT_OTHER)
Admission: EM | Admit: 2022-08-04 | Discharge: 2022-08-04 | Disposition: A | Discharge status: Home / Self Care | Payer: PRIVATE HEALTH INSURANCE | Attending: Emergency Medicine | Admitting: Emergency Medicine

## 2022-08-04 DIAGNOSIS — S53402A Unspecified sprain of left elbow, initial encounter: Secondary | ICD-10-CM | POA: Diagnosis not present

## 2022-08-04 DIAGNOSIS — Z96651 Presence of right artificial knee joint: Secondary | ICD-10-CM | POA: Diagnosis not present

## 2022-08-04 DIAGNOSIS — X58XXXA Exposure to other specified factors, initial encounter: Secondary | ICD-10-CM | POA: Insufficient documentation

## 2022-08-04 DIAGNOSIS — J45909 Unspecified asthma, uncomplicated: Secondary | ICD-10-CM | POA: Insufficient documentation

## 2022-08-04 DIAGNOSIS — M25522 Pain in left elbow: Secondary | ICD-10-CM | POA: Diagnosis present

## 2022-08-04 DIAGNOSIS — Z79899 Other long term (current) drug therapy: Secondary | ICD-10-CM | POA: Insufficient documentation

## 2022-08-04 MED ORDER — IBUPROFEN 600 MG PO TABS
600.0000 mg | ORAL_TABLET | Freq: Four times a day (QID) | ORAL | 0 refills | Status: DC | PRN
  Filled 2022-08-04: qty 30, 8d supply, fill #0

## 2022-08-04 MED ORDER — TRAMADOL HCL 50 MG PO TABS
50.0000 mg | ORAL_TABLET | Freq: Four times a day (QID) | ORAL | 0 refills | Status: DC | PRN
  Filled 2022-08-04: qty 15, 4d supply, fill #0

## 2022-08-04 MED ORDER — ACETAMINOPHEN 325 MG PO TABS
650.0000 mg | ORAL_TABLET | Freq: Four times a day (QID) | ORAL | 0 refills | Status: AC | PRN
  Filled 2022-08-04: qty 100, 13d supply, fill #0

## 2022-08-04 MED ORDER — METHOCARBAMOL 500 MG PO TABS
1000.0000 mg | ORAL_TABLET | Freq: Two times a day (BID) | ORAL | 0 refills | Status: AC
  Filled 2022-08-04: qty 20, 5d supply, fill #0

## 2022-08-04 NOTE — ED Notes (Signed)
ED Provider at bedside. 

## 2022-08-04 NOTE — ED Provider Notes (Signed)
[facility] Provider Note  CSN: [id number] Arrival date & time: 08/04/22 1206  Chief Complaint(s) Elbow pain/ WC injury 08/03/22  HPI Alexandra Cabrera is a 57 y.o. female with past medical history as below, significant for childhood asthma, diverticulosis, GERD, IBS,  who presents to the ED with complaint of left elbow injury/pain. Pt reports yesterday while at work she was helping patient to use the restroom, while reaching for the patient with left arm felt a sharp popping/pain sensation to medial aspect of left elbow. Pain has persisted since the incident and she is unable to fully extend her left arm. No pain to shoulder or wrist, no numbness/rashes but does report some intermittent tingling to left hand. No sig relief with otc analgesics. No hx prior injury to this area.   Surg hx includes right shoulder lipoma removal Dr Marlou Starks 3/23, hysterectomy, left ankle ORIF Dr Percell Miller 12/19 Past Medical History 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, I have never had that problem"; (patient had sevo and succinylcholine with no apparent complication on 57/32/20 at [facility]   Fibula fracture 07/23/2018   Distal fibular fracture   GERD (gastroesophageal reflux disease)    Hemorrhoids 02/04/2018   noted on Colonoscopy   History of appendicitis    History of colon polyps 02/04/2018   History of gallstones    History of kidney stones    Hyperlipidemia, mixed 05/17/2016   Patient denies   IBS (irritable bowel syndrome) 01/03/2015   IC (interstitial cystitis)    Migraines    Pelvic pain    PONV (postoperative nausea and vomiting)     "difficult to wake up."   Preventative health care 01/03/2015   Rash and nonspecific skin eruption 11/01/2015   Seasonal allergies    Patient Active Problem List   Diagnosis Date Noted   Dog bite 02/17/2020   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 05/29/2018 05/29/2018   Seasonal allergies 01/07/2018   Right leg pain 03/19/2017   Hyperlipidemia, mixed 05/17/2016   Acne 11/01/2015   Rash and nonspecific skin eruption 11/01/2015   IBS (irritable bowel syndrome) 01/03/2015   Preventative health care 01/03/2015   S/P hysterectomy 09/09/2012   Menopause 09/09/2012   Hot flushes, perimenopausal 09/09/2012   Family history of breast cancer in female 09/09/2012   Chronic interstitial cystitis 08/10/2011   Migraine without status migrainosus, not intractable 09/24/2007   ENDOMETRIOSIS 09/24/2007   ARTHRITIS 09/24/2007   NEPHROLITHIASIS, HX OF 09/24/2007   Home Medication(s) Prior to Admission medications   Medication Sig Start Date End Date Taking? Authorizing Provider  amoxicillin (AMOXIL) 500 MG capsule Take 4 capsules by mouth 1 hour prior to dental appointment 07/06/21     cycloSPORINE (RESTASIS) 0.05 % ophthalmic emulsion Instill 1 drop into both eyes twice a day as directed Patient taking differently: Place 1 drop into both eyes at bedtime as needed (dry eyes). 11/09/20     estradiol (ESTRACE) 2 MG tablet Take 1 tablet (2 mg total) by mouth daily. 07/13/22   Shelda Pal, DO  fluticasone (FLOVENT HFA) 110 MCG/ACT inhaler Inhale 2  puffs into the lungs in the morning and at bedtime. Rinse mouth out after use. Patient taking differently: Inhale 2 puffs into the lungs 2 (two) times daily as needed (wheezing or shortness of breath). Rinse mouth out after use. 01/11/21   Shelda Pal, DO  ibuprofen (ADVIL) 800 MG tablet TAKE 1 TABLET BY MOUTH AT ONSET OF MIGRAINE MAY REPEAT IN 6 HOURS AS NEEDED. 01/04/22   Shelda Pal, DO  levalbuterol [facility] HFA)  45 MCG/ACT inhaler Inhale 1-2 puffs by mouth into the lungs every 6 (six) hours as needed for wheezing or shortness of breath. 02/20/22   Wendling, Crosby Oyster, DO  loratadine-pseudoephedrine (CLARITIN-D 24-HOUR) 10-240 MG 24 hr tablet Take 1 tablet by mouth daily. Patient taking differently: Take 1 tablet by mouth daily as needed for allergies. 08/31/21   Shelda Pal, DO  Multiple Vitamin (MULTIVITAMIN WITH MINERALS) TABS tablet Take 1 tablet by mouth daily.    [provider]  rizatriptan (MAXALT) 10 MG tablet Take 1 tablet (10 mg total) by mouth as needed for migraine. 07/14/22 07/14/23  Shelda Pal, DO  topiramate (TOPAMAX) 100 MG tablet Take 1 tablet (100 mg total) by mouth 2 (two) times daily. 03/15/22   Shelda Pal, DO                                                                                                                                    Past Surgical History Past Surgical History:  Procedure Laterality Date   ABDOMINAL HYSTERECTOMY     APPENDECTOMY     CHOLECYSTECTOMY N/A 10/12/2014   Procedure: LAPAROSCOPIC CHOLECYSTECTOMY WITH INTRAOPERATIVE CHOLANGIOGRAM;  Surgeon: Doreen Salvage, MD;  Location: [place];  Service: General;  Laterality: N/A;   COLONOSCOPY  02/04/2018   CYSTOSCOPY WITH RETROGRADE PYELOGRAM, URETEROSCOPY AND STENT PLACEMENT Right 08/04/2019   Procedure: CYSTOSCOPY WITH RETROGRADE PYELOGRAM, RIGHT URETEROSCOPY, BASKET STONE EXTRACTION;  Surgeon: Irine Seal, MD;  [facility];  Service: Urology;  Laterality: Right;   HEMORROIDECTOMY     young adult   LIPOMA EXCISION Right 10/14/2021   Procedure: EXCISION OF RIGHT SHOULDER LIPOMA;  Surgeon: Jovita Kussmaul, MD;  Location: [place];  Service: General;  Laterality: Right;   ORIF ANKLE FRACTURE Left 07/30/2018   Procedure: OPEN REDUCTION INTERNAL FIXATION (ORIF) LEFT ANKLE FRACTURE;  Surgeon: Renette Butters, MD;  Location: [place];  Service: Orthopedics;   Laterality: Left;   PARTIAL PROCTECTOMY BY TEM N/A 05/29/2018   Procedure: PARTIAL PROCTECTOMY BY TEM EXCISION OF ANAL TAG;  Surgeon: Michael Boston, MD;  Location: [facility];  Service: General;  Laterality: N/A;   TOTAL KNEE ARTHROPLASTY     1 knee replacements, 7 knee revision, 28 surgeries on right knee   Family History Family History  Problem Relation Age of Onset   Diabetes Mother    Heart disease Mother        MI  Arthritis Father    COPD Father        smoker   Hypertension Father    Hyperlipidemia Father    Arthritis Sister    Arthritis Brother    Cancer Maternal Grandmother 57       ovarian   Arthritis Paternal Grandmother    Heart disease Paternal Grandfather        MI   Colon cancer Neg Hx    Esophageal cancer Neg Hx    Liver cancer Neg Hx    Pancreatic cancer Neg Hx    Rectal cancer Neg Hx    Stomach cancer Neg Hx     Social History Social History   Tobacco Use   Smoking status: Never   Smokeless tobacco: Never  Vaping Use   Vaping Use: Never used  Substance Use Topics   Alcohol use: No   Drug use: No   Allergies Latex, Phenergan [promethazine hcl], and Reglan [metoclopramide]  Review of Systems Review of Systems  Constitutional:  Negative for activity change and fever.  HENT:  Negative for facial swelling and trouble swallowing.   Eyes:  Negative for discharge and redness.  Respiratory:  Negative for cough and shortness of breath.   Cardiovascular:  Negative for chest pain and palpitations.  Gastrointestinal:  Negative for abdominal pain and nausea.  Genitourinary:  Negative for dysuria and flank pain.  Musculoskeletal:  Positive for arthralgias. Negative for back pain and gait problem.  Skin:  Negative for pallor and rash.  Neurological:  Negative for syncope and headaches.    Physical Exam Vital Signs  I have reviewed the triage vital signs BP (!) 147/82 (BP Location: Right Arm)   Pulse 78   Temp 98.3 F (36.8 C) (Oral)   Resp 18   Ht 4'  11.5" (1.511 m)   Wt 50.3 kg   SpO2 100%   BMI 22.04 kg/m  Physical Exam Vitals and nursing note reviewed.  Constitutional:      General: She is not in acute distress.    Appearance: Normal appearance.  HENT:     Head: Normocephalic and atraumatic.     Right Ear: External ear normal.     Left Ear: External ear normal.     Nose: Nose normal.     Mouth/Throat:     Mouth: Mucous membranes are moist.  Eyes:     General: No scleral icterus.       Right eye: No discharge.        Left eye: No discharge.  Cardiovascular:     Rate and Rhythm: Normal rate and regular rhythm.     Pulses: Normal pulses.  Pulmonary:     Effort: Pulmonary effort is normal. No respiratory distress.  Abdominal:     General: Abdomen is flat. There is no distension.  Musculoskeletal:        General: Normal range of motion.       Arms:     Cervical back: No rigidity.     Right lower leg: No edema.     Left lower leg: No edema.     Comments: Pain to [place] canal on left UE LUE is NVI Radial pulse 2+ Unable to fully extend left elbow Pain with passive supination to left forearm No pain to ipsilateral wrist/shoulder  Skin:    General: Skin is warm and dry.     Capillary Refill: Capillary refill takes less than 2 seconds.  Neurological:     Mental Status: She is  alert.  Psychiatric:        Mood and Affect: Mood normal.        Behavior: Behavior normal.     ED Results and Treatments Labs (all labs ordered are listed, but only abnormal results are displayed) Labs Reviewed - No data to display                                                                                                                        Radiology No results found.  Pertinent labs & imaging results that were available during my care of the patient were reviewed by me and considered in my medical decision making (see MDM for details).  Medications Ordered in ED Medications - No data to display                                                                                                                                    Procedures Procedures  (including critical care time)  Medical Decision Making / ED Course   MDM:  Alexandra Cabrera is a 57 y.o. female ith past medical history as below, significant for childhood asthma, diverticulosis, GERD, IBS,  who presents to the ED with complaint of left elbow injury/pain.. The complaint involves an extensive differential diagnosis and also carries with it a high risk of complications and morbidity.  Serious etiology was considered. Ddx includes but is not limited to: sprain, strain, msk, fx, soft tissue injury, ligamentous injury, etc  On initial assessment the patient is: resting comfortably, NAD, HDS. Vital signs and nursing notes were reviewed      Additional history obtained: -Additional history obtained from na -External records from outside source obtained and reviewed including: Chart review including previous notes, labs, imaging, consultation notes including primary care notes, home meds, prior labs/imaging    Lab Tests: -I ordered, reviewed, and interpreted labs.   The pertinent results include:   Labs Reviewed - No data to display  Notable for na  EKG   EKG Interpretation  Date/Time:    Ventricular Rate:    PR Interval:    QRS Duration:   QT Interval:    QTC Calculation:   R Axis:     Text Interpretation:           Imaging Studies ordered: I ordered imaging studies including elbow left I independently visualized the following imaging with scope of interpretation limited to  determining acute life threatening conditions related to emergency care: ***, which revealed *** I independently visualized and interpreted imaging. I agree with the radiologist interpretation   Medicines ordered and prescription drug management: No orders of the defined types were placed in this encounter.   -I have reviewed the patients home medicines and have  made adjustments as needed   Consultations Obtained: I requested consultation with the ***,  and discussed lab and imaging findings as well as pertinent plan - they recommend: ***   Cardiac Monitoring: The patient was maintained on a cardiac monitor.  I personally viewed and interpreted the cardiac monitored which showed an underlying rhythm of: na  Social Determinants of Health:  Diagnosis or treatment significantly limited by social determinants of health: na   Reevaluation: After the interventions noted above, I reevaluated the patient and found that they have improved  Co morbidities that complicate the patient evaluation  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, I have never had that problem"; (patient had sevo and succinylcholine with no apparent complication on 82/95/62 at [facility])   Fibula fracture 07/23/2018   Distal fibular fracture   GERD (gastroesophageal reflux disease)    Hemorrhoids 02/04/2018   noted on Colonoscopy   History of appendicitis    History of colon polyps 02/04/2018   History of gallstones    History of kidney stones    Hyperlipidemia, mixed 05/17/2016   Patient denies   IBS (irritable bowel syndrome) 01/03/2015   IC (interstitial cystitis)    Migraines    Pelvic pain    PONV (postoperative nausea and vomiting)    "difficult to wake up."   Preventative health care 01/03/2015   Rash and nonspecific skin eruption 11/01/2015   Seasonal allergies       Dispostion: Disposition decision including need for hospitalization was considered, and patient {[id number]::"discharged from emergency department."}    Final  Clinical Impression(s) / ED Diagnoses Final diagnoses:  None     This chart was dictated using voice recognition software.  Despite best efforts to proofread,  errors can occur which can change the documentation meaning.

## 2022-08-04 NOTE — ED Triage Notes (Signed)
Pt. Needed assistance getting up from the stretcher to go to restroom.  RN attempted to help the Pt. And L elbow popped.  Now the L elbow is painful with some noted tingling into the L hand and at times with inability to straighten the L arm.

## 2022-08-04 NOTE — Discharge Instructions (Addendum)
It was a pleasure caring for you today in the emergency department.  Please return to the emergency department for any worsening or worrisome symptoms.  Please follow up with orthopedics if symptoms persist

## 2022-08-04 NOTE — ED Notes (Signed)
Patient transported to X-ray 

## 2022-08-07 ENCOUNTER — Other Ambulatory Visit (HOSPITAL_BASED_OUTPATIENT_CLINIC_OR_DEPARTMENT_OTHER): Payer: Self-pay

## 2022-08-07 ENCOUNTER — Other Ambulatory Visit: Payer: Self-pay

## 2022-08-08 ENCOUNTER — Other Ambulatory Visit (HOSPITAL_BASED_OUTPATIENT_CLINIC_OR_DEPARTMENT_OTHER): Payer: Self-pay

## 2022-08-18 ENCOUNTER — Other Ambulatory Visit (HOSPITAL_BASED_OUTPATIENT_CLINIC_OR_DEPARTMENT_OTHER): Payer: Self-pay

## 2022-08-31 ENCOUNTER — Other Ambulatory Visit: Payer: Self-pay

## 2022-09-01 ENCOUNTER — Other Ambulatory Visit (HOSPITAL_BASED_OUTPATIENT_CLINIC_OR_DEPARTMENT_OTHER): Payer: Self-pay

## 2022-09-07 ENCOUNTER — Other Ambulatory Visit: Payer: Self-pay | Admitting: Family Medicine

## 2022-09-07 ENCOUNTER — Other Ambulatory Visit (HOSPITAL_BASED_OUTPATIENT_CLINIC_OR_DEPARTMENT_OTHER): Payer: Self-pay

## 2022-09-07 DIAGNOSIS — G43909 Migraine, unspecified, not intractable, without status migrainosus: Secondary | ICD-10-CM

## 2022-09-07 MED ORDER — TOPIRAMATE 50 MG PO TABS
50.0000 mg | ORAL_TABLET | Freq: Two times a day (BID) | ORAL | 2 refills | Status: DC
  Filled 2022-09-07: qty 180, 90d supply, fill #0
  Filled 2022-12-07: qty 180, 90d supply, fill #1
  Filled 2023-03-02: qty 180, 90d supply, fill #2

## 2022-09-07 NOTE — Telephone Encounter (Signed)
  Pharmacy comment: pt says she takes total dose of '150mg'$  bid by taking 1x'100mg'$  tab and 1x'50mg'$  together, needs this Rx for '50mg'$

## 2022-10-04 ENCOUNTER — Other Ambulatory Visit (HOSPITAL_BASED_OUTPATIENT_CLINIC_OR_DEPARTMENT_OTHER): Payer: Self-pay

## 2022-10-12 ENCOUNTER — Other Ambulatory Visit (HOSPITAL_BASED_OUTPATIENT_CLINIC_OR_DEPARTMENT_OTHER): Payer: Self-pay

## 2022-10-25 ENCOUNTER — Other Ambulatory Visit (HOSPITAL_BASED_OUTPATIENT_CLINIC_OR_DEPARTMENT_OTHER): Payer: Self-pay

## 2022-10-25 ENCOUNTER — Other Ambulatory Visit: Payer: Self-pay | Admitting: Family Medicine

## 2022-10-25 MED ORDER — ESTRADIOL 2 MG PO TABS
2.0000 mg | ORAL_TABLET | Freq: Every day | ORAL | 0 refills | Status: DC
  Filled 2022-10-25: qty 90, 90d supply, fill #0

## 2022-11-16 ENCOUNTER — Other Ambulatory Visit (HOSPITAL_BASED_OUTPATIENT_CLINIC_OR_DEPARTMENT_OTHER): Payer: Self-pay

## 2022-11-16 MED ORDER — CYCLOSPORINE 0.05 % OP EMUL
1.0000 [drp] | Freq: Two times a day (BID) | OPHTHALMIC | 4 refills | Status: AC
  Filled 2022-11-16: qty 5.5, 30d supply, fill #0

## 2022-12-05 ENCOUNTER — Telehealth: Payer: Self-pay | Admitting: Family Medicine

## 2022-12-05 ENCOUNTER — Other Ambulatory Visit (HOSPITAL_BASED_OUTPATIENT_CLINIC_OR_DEPARTMENT_OTHER): Payer: Self-pay

## 2022-12-05 ENCOUNTER — Encounter: Payer: Self-pay | Admitting: Family Medicine

## 2022-12-05 ENCOUNTER — Telehealth (INDEPENDENT_AMBULATORY_CARE_PROVIDER_SITE_OTHER): Payer: Commercial Managed Care - PPO | Admitting: Family Medicine

## 2022-12-05 DIAGNOSIS — B029 Zoster without complications: Secondary | ICD-10-CM | POA: Diagnosis not present

## 2022-12-05 MED ORDER — PREDNISONE 20 MG PO TABS
40.0000 mg | ORAL_TABLET | Freq: Every day | ORAL | 0 refills | Status: AC
  Filled 2022-12-05: qty 10, 5d supply, fill #0

## 2022-12-05 MED ORDER — VALACYCLOVIR HCL 1 G PO TABS
1000.0000 mg | ORAL_TABLET | Freq: Three times a day (TID) | ORAL | 0 refills | Status: AC
  Filled 2022-12-05: qty 21, 7d supply, fill #0

## 2022-12-05 NOTE — Telephone Encounter (Signed)
Pt called stating that she has a shingles breakout and was looking to have medication sent in to treat it.

## 2022-12-05 NOTE — Progress Notes (Signed)
Chief Complaint  Patient presents with   Herpes Zoster    Alexandra Cabrera is a 57 y.o. female here for a skin complaint.   Duration: 1 week Location: R posterior scalp Mom recently passed.  Saw eye doc around 4/22. Pruritic? Yes Painful? Yes Drainage? No New soaps/lotions/topicals/detergents? No Sick contacts? No Other associated symptoms: feels like there is dirt on her eye Therapies tried thus far: Tylenol  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, I have never had that problem"; (patient had sevo and succinylcholine with no apparent complication on 06/17/02 at [facility])   Fibula fracture 07/23/2018   Distal fibular fracture   GERD (gastroesophageal reflux disease)    Hemorrhoids 02/04/2018   noted on Colonoscopy   History of appendicitis    History of colon polyps 02/04/2018   History of gallstones    History of kidney stones    Hyperlipidemia, mixed 05/17/2016   Patient denies   IBS (irritable bowel syndrome) 01/03/2015   IC (interstitial cystitis)    Migraines    Pelvic pain    PONV (postoperative nausea and vomiting)    "difficult to wake up."   Preventative health care 01/03/2015   Rash and nonspecific skin eruption 11/01/2015   Seasonal allergies    Objective No conversational dyspnea Age appropriate judgment and insight Nml affect and mood Appears to have excoriated lesions over her right maxillary area  Herpes zoster without complication - Plan: valACYclovir (VALTREX) 1000 MG tablet, predniSONE (DELTASONE) 20 MG tablet  7 days of prednisone 40 mg daily, 7 days of Valtrex 1000 mg 3 times daily.  I have asked her to reach out to  her ophthalmologist given the eye complaint on the right side.  The main dermatome of interest is the V2 dermatome though. F/u prn. The patient voiced understanding and agreement to the plan.  Jilda Roche [place], DO 12/05/22 2:59 PM

## 2022-12-05 NOTE — Telephone Encounter (Signed)
Called and scheduled video visit today

## 2022-12-07 ENCOUNTER — Other Ambulatory Visit (HOSPITAL_BASED_OUTPATIENT_CLINIC_OR_DEPARTMENT_OTHER): Payer: Self-pay

## 2022-12-07 ENCOUNTER — Other Ambulatory Visit: Payer: Self-pay

## 2022-12-07 ENCOUNTER — Other Ambulatory Visit: Payer: Self-pay | Admitting: Family Medicine

## 2022-12-07 DIAGNOSIS — G43909 Migraine, unspecified, not intractable, without status migrainosus: Secondary | ICD-10-CM

## 2022-12-07 MED ORDER — TOPIRAMATE 100 MG PO TABS
100.0000 mg | ORAL_TABLET | Freq: Two times a day (BID) | ORAL | 2 refills | Status: DC
  Filled 2022-12-07 – 2023-01-26 (×2): qty 180, 90d supply, fill #0
  Filled 2023-04-19: qty 180, 90d supply, fill #1
  Filled 2023-08-09: qty 180, 90d supply, fill #2

## 2022-12-07 MED ORDER — ESTRADIOL 2 MG PO TABS
2.0000 mg | ORAL_TABLET | Freq: Every day | ORAL | 0 refills | Status: DC
  Filled 2022-12-07 – 2023-01-25 (×2): qty 90, 90d supply, fill #0

## 2022-12-29 ENCOUNTER — Other Ambulatory Visit (HOSPITAL_BASED_OUTPATIENT_CLINIC_OR_DEPARTMENT_OTHER): Payer: Self-pay

## 2022-12-29 ENCOUNTER — Other Ambulatory Visit: Payer: Self-pay | Admitting: Family Medicine

## 2022-12-29 MED ORDER — ONDANSETRON HCL 4 MG PO TABS
4.0000 mg | ORAL_TABLET | Freq: Three times a day (TID) | ORAL | 2 refills | Status: DC | PRN
  Filled 2022-12-29: qty 20, 7d supply, fill #0
  Filled 2023-08-09: qty 20, 7d supply, fill #1
  Filled 2023-11-20: qty 20, 7d supply, fill #2

## 2023-01-25 ENCOUNTER — Other Ambulatory Visit (HOSPITAL_BASED_OUTPATIENT_CLINIC_OR_DEPARTMENT_OTHER): Payer: Self-pay

## 2023-01-25 MED ORDER — AMOXICILLIN 500 MG PO CAPS
2000.0000 mg | ORAL_CAPSULE | Freq: Every day | ORAL | 4 refills | Status: AC
  Filled 2023-01-25: qty 4, 1d supply, fill #0
  Filled 2023-07-17: qty 4, 1d supply, fill #1
  Filled 2023-09-13: qty 4, 1d supply, fill #2
  Filled 2024-01-05: qty 4, 1d supply, fill #3

## 2023-01-26 ENCOUNTER — Other Ambulatory Visit (HOSPITAL_BASED_OUTPATIENT_CLINIC_OR_DEPARTMENT_OTHER): Payer: Self-pay

## 2023-02-02 ENCOUNTER — Encounter: Payer: Self-pay | Admitting: Family Medicine

## 2023-02-02 ENCOUNTER — Other Ambulatory Visit (HOSPITAL_BASED_OUTPATIENT_CLINIC_OR_DEPARTMENT_OTHER): Payer: Self-pay

## 2023-02-02 ENCOUNTER — Ambulatory Visit (INDEPENDENT_AMBULATORY_CARE_PROVIDER_SITE_OTHER): Payer: Commercial Managed Care - PPO | Admitting: Family Medicine

## 2023-02-02 VITALS — BP 120/80 | HR 75 | Temp 98.1°F | Ht 59.0 in | Wt 110.4 lb

## 2023-02-02 DIAGNOSIS — L678 Other hair color and hair shaft abnormalities: Secondary | ICD-10-CM | POA: Diagnosis not present

## 2023-02-02 DIAGNOSIS — Z0000 Encounter for general adult medical examination without abnormal findings: Secondary | ICD-10-CM

## 2023-02-02 DIAGNOSIS — Z1231 Encounter for screening mammogram for malignant neoplasm of breast: Secondary | ICD-10-CM | POA: Diagnosis not present

## 2023-02-02 DIAGNOSIS — R0989 Other specified symptoms and signs involving the circulatory and respiratory systems: Secondary | ICD-10-CM | POA: Diagnosis not present

## 2023-02-02 LAB — COMPREHENSIVE METABOLIC PANEL
ALT: 27 U/L (ref 0–35)
AST: 25 U/L (ref 0–37)
Albumin: 4.1 g/dL (ref 3.5–5.2)
Alkaline Phosphatase: 54 U/L (ref 39–117)
BUN: 17 mg/dL (ref 6–23)
CO2: 24 mEq/L (ref 19–32)
Calcium: 9.3 mg/dL (ref 8.4–10.5)
Chloride: 108 mEq/L (ref 96–112)
Creatinine, Ser: 0.95 mg/dL (ref 0.40–1.20)
GFR: 66.22 mL/min (ref >60.00)
Glucose, Bld: 84 mg/dL (ref 70–99)
Potassium: 3.8 mEq/L (ref 3.5–5.1)
Sodium: 141 mEq/L (ref 135–145)
Total Bilirubin: 0.5 mg/dL (ref 0.2–1.2)
Total Protein: 6.3 g/dL (ref 6.0–8.3)

## 2023-02-02 LAB — LIPID PANEL
Cholesterol: 183 mg/dL (ref 0–200)
HDL: 66.3 mg/dL (ref >39.00)
LDL Cholesterol: 101 mg/dL — ABNORMAL HIGH (ref 0–99)
NonHDL: 116.48
Total CHOL/HDL Ratio: 3
Triglycerides: 77 mg/dL (ref 0.0–149.0)
VLDL: 15.4 mg/dL (ref 0.0–40.0)

## 2023-02-02 LAB — CBC
HCT: 42.1 % (ref 36.0–46.0)
Hemoglobin: 13.5 g/dL (ref 12.0–15.0)
MCHC: 32.2 g/dL (ref 30.0–36.0)
MCV: 98 fl (ref 78.0–100.0)
Platelets: 273 10*3/uL (ref 150.0–400.0)
RBC: 4.29 Mil/uL (ref 3.87–5.11)
RDW: 12.2 % (ref 11.5–15.5)
WBC: 8.6 10*3/uL (ref 4.0–10.5)

## 2023-02-02 MED ORDER — EFLORNITHINE HCL 13.9 % EX CREA
1.0000 | TOPICAL_CREAM | Freq: Two times a day (BID) | CUTANEOUS | 1 refills | Status: DC
  Filled 2023-02-02: qty 45, fill #0
  Filled 2023-03-02 – 2023-07-17 (×2): qty 45, 30d supply, fill #0

## 2023-02-02 NOTE — Progress Notes (Signed)
Chief Complaint  Patient presents with   Annual Exam     Well Woman Alexandra Cabrera is here for a complete physical.   Her last physical was >1 year ago.  Current diet: in general, a "healthy" diet. Current exercise: limited recently. Weight is stable and she denies fatigue out of ordinary. No LMP recorded. Patient has had a hysterectomy. Seatbelt? Yes Advanced directive? No  Health Maintenance Pap/HPV- N/A, hysterectomy Mammogram- No Colon cancer screening-No Shingrix- Yes Tetanus- Yes Hep C screening- Yes HIV screening- Yes  Patient reports a history of speech like fuzz on her face.  She was previously prescribed eflornithine for this which did help.  She would use it every once in a while, her issue would resolve and then she used again when needed.  She had no adverse effects to this topical.  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, I have never had that problem"; (patient had sevo and succinylcholine with no apparent complication on 06/17/02 at [facility])   Fibula fracture 07/23/2018   Distal fibular fracture   GERD (gastroesophageal reflux disease)    Hemorrhoids 02/04/2018   noted on Colonoscopy   History of appendicitis    History of colon polyps 02/04/2018   History of gallstones    History of kidney stones    Hyperlipidemia, mixed 05/17/2016   Patient denies   IBS (irritable bowel syndrome) 01/03/2015   IC (interstitial cystitis)    Migraines    Pelvic pain    PONV (postoperative nausea and vomiting)    "difficult to wake up."   Preventative health care 01/03/2015   Rash and nonspecific skin eruption 11/01/2015   Seasonal allergies       Past Surgical History:  Procedure Laterality Date   ABDOMINAL HYSTERECTOMY     APPENDECTOMY     CHOLECYSTECTOMY N/A 10/12/2014   Procedure: LAPAROSCOPIC CHOLECYSTECTOMY WITH INTRAOPERATIVE CHOLANGIOGRAM;  Surgeon: Frederik Schmidt, MD;  Location: [facility] OR;  Service: General;  Laterality: N/A;   COLONOSCOPY  02/04/2018   CYSTOSCOPY WITH RETROGRADE PYELOGRAM, URETEROSCOPY AND STENT PLACEMENT Right 08/04/2019   Procedure: CYSTOSCOPY WITH RETROGRADE PYELOGRAM, RIGHT URETEROSCOPY, BASKET STONE EXTRACTION;  Surgeon: Bjorn Pippin, MD;  Location: [facility];  Service: Urology;  Laterality: Right;   HEMORROIDECTOMY     young adult   LIPOMA EXCISION Right 10/14/2021   Procedure: EXCISION OF RIGHT SHOULDER LIPOMA;  Surgeon: Griselda Miner, MD;  Location: [facility] OR;  Service: General;  Laterality: Right;   ORIF ANKLE FRACTURE Left 07/30/2018   Procedure: OPEN REDUCTION INTERNAL FIXATION (ORIF) LEFT ANKLE FRACTURE;  Surgeon: Sheral Apley, MD;  Location: [facility];  Service: Orthopedics;  Laterality: Left;   PARTIAL PROCTECTOMY BY TEM N/A 05/29/2018   Procedure: PARTIAL PROCTECTOMY BY TEM EXCISION OF ANAL TAG;  Surgeon: Karie Soda, MD;  Location: [facility];  Service: General;  Laterality: N/A;   TOTAL KNEE ARTHROPLASTY     1 knee replacements, 7 knee revision, 28 surgeries on right knee    Medications  Current Outpatient Medications on File Prior to Visit  Medication Sig Dispense Refill   acetaminophen (TYLENOL)  325 MG tablet Take 2 tablets (650 mg total) by mouth every 6 (six) hours as needed. 100 tablet 0   amoxicillin (AMOXIL) 500 MG capsule Take 4 capsules (2,000 mg total) by mouth 1 hour before dental treatment. 4 capsule 4   cycloSPORINE (RESTASIS) 0.05 % ophthalmic emulsion Place 1 drop into both eyes 2 (two) times daily as directed. 5.5 mL 4   estradiol (ESTRACE) 2 MG tablet Take 1 tablet (2 mg total) by mouth daily. 90 tablet 0   fluticasone (FLOVENT HFA) 110 MCG/ACT inhaler Inhale 2  puffs into the lungs in the morning and at bedtime. Rinse mouth out after use. (Patient taking differently: Inhale 2 puffs into the lungs 2 (two) times daily as needed (wheezing or shortness of breath). Rinse mouth out after use.) 12 g 1   ibuprofen (ADVIL) 800 MG tablet TAKE 1 TABLET BY MOUTH AT ONSET OF MIGRAINE MAY REPEAT IN 6 HOURS AS NEEDED. 30 tablet 5   levalbuterol (XOPENEX HFA) 45 MCG/ACT inhaler Inhale 1-2 puffs by mouth into the lungs every 6 (six) hours as needed for wheezing or shortness of breath. 15 g 12   loratadine-pseudoephedrine (CLARITIN-D 24-HOUR) 10-240 MG 24 hr tablet Take 1 tablet by mouth daily. (Patient taking differently: Take 1 tablet by mouth daily as needed for allergies.) 30 tablet 3   Multiple Vitamin (MULTIVITAMIN WITH MINERALS) TABS tablet Take 1 tablet by mouth daily.     ondansetron (ZOFRAN) 4 MG tablet Take 1 tablet (4 mg total) by mouth every 8 (eight) hours as needed. 20 tablet 2   rizatriptan (MAXALT) 10 MG tablet Take 1 tablet (10 mg total) by mouth as needed for migraine. 10 tablet 6   topiramate (TOPAMAX) 100 MG tablet Take 1 tablet (100 mg total) by mouth 2 (two) times daily. 180 tablet 2   topiramate (TOPAMAX) 50 MG tablet Take 1 tablet (50 mg total) by mouth in the morning and at bedtime. 180 tablet 2   No current facility-administered medications on file prior to visit.     Allergies Allergies  Allergen Reactions   Latex Anaphylaxis   Phenergan [Promethazine Hcl] Other (See Comments)    Shaky, tremors   Reglan [Metoclopramide] Anxiety    Review of Systems: Constitutional:  no unexpected weight changes Eye:  no recent significant change in vision Ear/Nose/Mouth/Throat:  Ears:  no recent change in hearing Nose/Mouth/Throat:  no complaints of nasal congestion, no sore throat Cardiovascular: no chest pain Respiratory:  no shortness of breath Gastrointestinal:  no abdominal pain, no change in bowel habits GU:  Female: negative for dysuria or  pelvic pain Musculoskeletal/Extremities:  no pain of the joints Integumentary (Skin/Breast):  + Facial hair Neurologic:  no headaches Endocrine:  denies fatigue  Exam BP 120/80 (BP Location: Left Arm, Patient Position: Sitting, Cuff Size: Normal)   Pulse 75   Temp 98.1 F (36.7 C) (Oral)   Ht 4\' 11"  (1.499 m)   Wt 110 lb 6 oz (50.1 kg)   SpO2 98%   BMI 22.29 kg/m  General:  well developed, well nourished, in no apparent distress Skin: Fine hairs are noted along her jawline bilaterally; otherwise no significant moles, warts, or growths Head:  no masses, lesions, or tenderness Eyes:  pupils equal and round, sclera anicteric without injection Ears:  canals without lesions, TMs shiny without retraction, no obvious effusion, no erythema Nose:  nares patent, mucosa normal, and no drainage  Throat/Pharynx:  lips and gingiva without lesion; tongue and uvula midline; non-inflamed  pharynx; no exudates or postnasal drainage Neck: neck supple without adenopathy, thyromegaly, or masses Lungs:  clear to auscultation, breath sounds equal bilaterally, no respiratory distress Cardio:  regular rate and rhythm, no LE edema; right-sided carotid bruit appreciated, louder further away from the heart Abdomen:  abdomen soft, nontender; bowel sounds normal; no masses or organomegaly Genital: Defer to GYN Musculoskeletal:  symmetrical muscle groups noted without atrophy or deformity Extremities:  no clubbing, cyanosis, or edema, no deformities, no skin discoloration Neuro:  gait normal; deep tendon reflexes normal and symmetric Psych: well oriented with normal range of affect and appropriate judgment/insight  Assessment and Plan  Well adult exam - Plan: CBC, Comprehensive metabolic panel, Lipid panel  Encounter for screening mammogram for malignant neoplasm of breast - Plan: MM DIGITAL SCREENING BILATERAL  Abnormal facial hair - Plan: Eflornithine HCl 13.9 % cream  Bruit of right carotid artery -  Plan: US Carotid Duplex Bilateral   Well 57 y.o. female. Counseled on diet and exercise. CCS: had a moderately long discussion about colon cancer screening.  She has a known history of polyps.  She states even if she did have colon cancer she would not want to know, nor would she wish to pursue chemotherapy or surgical therapy.  I told her to let us know if she ever changes her mind about this.  I also voiced the possibility that it could shorten her duration of life due to delayed diagnosis in addition to increasing morbidity.  She is a Engineer, civil (consulting) and voiced understanding of these risks and wishes to hold off on screening. Will set her up for a mammogram along with a carotid ultrasound. Facial hair: Chronic, not currently controlled. Restart above cream bid.  Other orders as above. Follow up in 6 mo. The patient voiced understanding and agreement to the plan.  Jilda Roche [place], DO 02/02/23 12:36 PM

## 2023-02-02 NOTE — Patient Instructions (Addendum)
Give [place] 2-3 business days to get the results of your labs back.   Keep the diet clean and stay active.  Someone will be in touch regarding your mammogram and neck scan.   Take 1200 mg of calcium daily and at least 1000 units of vitamin D3 daily.   Please let me know if you change your mind about getting a colonoscopy.   Let us know if you need anything.

## 2023-02-19 ENCOUNTER — Encounter (HOSPITAL_BASED_OUTPATIENT_CLINIC_OR_DEPARTMENT_OTHER): Payer: Self-pay

## 2023-02-19 ENCOUNTER — Ambulatory Visit (HOSPITAL_BASED_OUTPATIENT_CLINIC_OR_DEPARTMENT_OTHER)
Admission: RE | Admit: 2023-02-19 | Discharge: 2023-02-19 | Disposition: A | Discharge status: Home / Self Care | Payer: Commercial Managed Care - PPO | Source: Ambulatory Visit | Attending: Family Medicine | Admitting: Family Medicine

## 2023-02-19 DIAGNOSIS — Z1231 Encounter for screening mammogram for malignant neoplasm of breast: Secondary | ICD-10-CM | POA: Diagnosis not present

## 2023-02-19 DIAGNOSIS — R0989 Other specified symptoms and signs involving the circulatory and respiratory systems: Secondary | ICD-10-CM | POA: Diagnosis not present

## 2023-03-02 ENCOUNTER — Other Ambulatory Visit (HOSPITAL_BASED_OUTPATIENT_CLINIC_OR_DEPARTMENT_OTHER): Payer: Self-pay

## 2023-04-11 ENCOUNTER — Other Ambulatory Visit (HOSPITAL_BASED_OUTPATIENT_CLINIC_OR_DEPARTMENT_OTHER): Payer: Self-pay

## 2023-04-11 MED ORDER — INFLUENZA VIRUS VACC SPLIT PF (FLUZONE) 0.5 ML IM SUSY
0.5000 mL | PREFILLED_SYRINGE | Freq: Once | INTRAMUSCULAR | 0 refills | Status: AC
  Filled 2023-04-11: qty 0.5, 1d supply, fill #0

## 2023-04-16 ENCOUNTER — Other Ambulatory Visit (HOSPITAL_BASED_OUTPATIENT_CLINIC_OR_DEPARTMENT_OTHER): Payer: Self-pay

## 2023-04-19 ENCOUNTER — Other Ambulatory Visit: Payer: Self-pay | Admitting: Family Medicine

## 2023-04-19 ENCOUNTER — Other Ambulatory Visit (HOSPITAL_BASED_OUTPATIENT_CLINIC_OR_DEPARTMENT_OTHER): Payer: Self-pay

## 2023-04-19 MED ORDER — ESTRADIOL 2 MG PO TABS
2.0000 mg | ORAL_TABLET | Freq: Every day | ORAL | 1 refills | Status: DC
  Filled 2023-04-19: qty 90, 90d supply, fill #0
  Filled 2023-07-17: qty 90, 90d supply, fill #1

## 2023-05-28 ENCOUNTER — Other Ambulatory Visit (HOSPITAL_BASED_OUTPATIENT_CLINIC_OR_DEPARTMENT_OTHER): Payer: Self-pay

## 2023-05-28 ENCOUNTER — Other Ambulatory Visit: Payer: Self-pay

## 2023-05-28 ENCOUNTER — Other Ambulatory Visit: Payer: Self-pay | Admitting: Family Medicine

## 2023-05-28 DIAGNOSIS — G43909 Migraine, unspecified, not intractable, without status migrainosus: Secondary | ICD-10-CM

## 2023-05-28 MED ORDER — TOPIRAMATE 50 MG PO TABS
50.0000 mg | ORAL_TABLET | Freq: Two times a day (BID) | ORAL | 2 refills | Status: DC
  Filled 2023-05-28: qty 180, 90d supply, fill #0
  Filled 2023-07-17 – 2023-09-12 (×2): qty 180, 90d supply, fill #1

## 2023-07-17 ENCOUNTER — Other Ambulatory Visit: Payer: Self-pay | Admitting: Family Medicine

## 2023-07-17 ENCOUNTER — Encounter: Payer: Self-pay | Admitting: Family Medicine

## 2023-07-17 ENCOUNTER — Ambulatory Visit (INDEPENDENT_AMBULATORY_CARE_PROVIDER_SITE_OTHER): Payer: Commercial Managed Care - PPO | Admitting: Physician Assistant

## 2023-07-17 ENCOUNTER — Other Ambulatory Visit (HOSPITAL_BASED_OUTPATIENT_CLINIC_OR_DEPARTMENT_OTHER): Payer: Self-pay

## 2023-07-17 ENCOUNTER — Ambulatory Visit (HOSPITAL_BASED_OUTPATIENT_CLINIC_OR_DEPARTMENT_OTHER)
Admission: RE | Admit: 2023-07-17 | Discharge: 2023-07-17 | Disposition: A | Discharge status: Home / Self Care | Payer: Commercial Managed Care - PPO | Source: Ambulatory Visit | Attending: Physician Assistant | Admitting: Physician Assistant

## 2023-07-17 ENCOUNTER — Other Ambulatory Visit: Payer: Self-pay

## 2023-07-17 VITALS — BP 139/97 | HR 77 | Temp 97.8°F | Ht 59.0 in | Wt 113.5 lb

## 2023-07-17 DIAGNOSIS — R109 Unspecified abdominal pain: Secondary | ICD-10-CM | POA: Diagnosis not present

## 2023-07-17 DIAGNOSIS — L678 Other hair color and hair shaft abnormalities: Secondary | ICD-10-CM

## 2023-07-17 DIAGNOSIS — N200 Calculus of kidney: Secondary | ICD-10-CM | POA: Diagnosis not present

## 2023-07-17 DIAGNOSIS — K5730 Diverticulosis of large intestine without perforation or abscess without bleeding: Secondary | ICD-10-CM | POA: Diagnosis not present

## 2023-07-17 LAB — POCT URINALYSIS DIP (MANUAL ENTRY)
Bilirubin, UA: NEGATIVE
Blood, UA: NEGATIVE
Glucose, UA: NEGATIVE mg/dL
Ketones, POC UA: NEGATIVE mg/dL
Leukocytes, UA: NEGATIVE
Nitrite, UA: NEGATIVE
Protein Ur, POC: NEGATIVE mg/dL
Spec Grav, UA: 1.01 (ref 1.010–1.025)
Urobilinogen, UA: 0.2 U/dL
pH, UA: 6.5 (ref 5.0–8.0)

## 2023-07-17 MED ORDER — KETOROLAC TROMETHAMINE 10 MG PO TABS
10.0000 mg | ORAL_TABLET | Freq: Four times a day (QID) | ORAL | 0 refills | Status: AC | PRN
  Filled 2023-07-17: qty 20, 5d supply, fill #0

## 2023-07-17 MED ORDER — METHOCARBAMOL 500 MG PO TABS
500.0000 mg | ORAL_TABLET | Freq: Three times a day (TID) | ORAL | 0 refills | Status: DC
  Filled 2023-07-17: qty 30, 10d supply, fill #0

## 2023-07-17 MED ORDER — EFLORNITHINE HCL 13.9 % EX CREA
1.0000 | TOPICAL_CREAM | Freq: Two times a day (BID) | CUTANEOUS | 1 refills | Status: AC
  Filled 2023-07-17: qty 45, fill #0
  Filled 2023-09-13: qty 45, 30d supply, fill #0

## 2023-07-17 NOTE — Telephone Encounter (Signed)
Pt needs an appt please

## 2023-07-17 NOTE — Telephone Encounter (Signed)
Pt was called and scheduled for 12.17.24 w/ Alfredia Ferguson

## 2023-07-17 NOTE — Telephone Encounter (Signed)
Pt called to follow up an status and advised she would like a call back when info is available.

## 2023-07-17 NOTE — Progress Notes (Unsigned)
Established patient visit   Patient: Alexandra Cabrera   DOB: 1966-06-20   57 y.o. Female  MRN: [id number] Visit Date: 07/17/2023  Today's healthcare provider: Alfredia Ferguson, PA-C   No chief complaint on file.  Subjective     Right flank, not able to urinate much during the day  Nausea/vomiting last night  Took zofran tylenol fever,  800 mg ibpur 000 m  Medications: Outpatient Medications Prior to Visit  Medication Sig   acetaminophen (TYLENOL) 325 MG tablet Take 2 tablets (650 mg total) by mouth every 6 (six) hours as needed.   amoxicillin (AMOXIL) 500 MG capsule Take 4 capsules (2,000 mg total) by mouth 1 hour before dental treatment.   cycloSPORINE (RESTASIS) 0.05 % ophthalmic emulsion Place 1 drop into both eyes 2 (two) times daily as directed.   Eflornithine HCl 13.9 % cream Apply 1 Application topically 2 (two) times daily at least 8 hours apart.   estradiol (ESTRACE) 2 MG tablet Take 1 tablet (2 mg total) by mouth daily.   fluticasone (FLOVENT HFA) 110 MCG/ACT inhaler Inhale 2 puffs into the lungs in the morning and at bedtime. Rinse mouth out after use. (Patient taking differently: Inhale 2 puffs into the lungs 2 (two) times daily as needed (wheezing or shortness of breath). Rinse mouth out after use.)   ibuprofen (ADVIL) 800 MG tablet TAKE 1 TABLET BY MOUTH AT ONSET OF MIGRAINE MAY REPEAT IN 6 HOURS AS NEEDED.   levalbuterol (XOPENEX HFA) 45 MCG/ACT inhaler Inhale 1-2 puffs by mouth into the lungs every 6 (six) hours as needed for wheezing or shortness of breath.   loratadine-pseudoephedrine (CLARITIN-D 24-HOUR) 10-240 MG 24 hr tablet Take 1 tablet by mouth daily. (Patient taking differently: Take 1 tablet by mouth daily as needed for allergies.)   Multiple Vitamin (MULTIVITAMIN WITH MINERALS) TABS tablet Take 1 tablet by mouth daily.   ondansetron (ZOFRAN) 4 MG tablet Take 1 tablet (4 mg total) by mouth every 8 (eight) hours as needed.   rizatriptan (MAXALT) 10 MG  tablet Take 1 tablet (10 mg total) by mouth as needed for migraine.   topiramate (TOPAMAX) 100 MG tablet Take 1 tablet (100 mg total) by mouth 2 (two) times daily.   topiramate (TOPAMAX) 50 MG tablet Take 1 tablet (50 mg total) by mouth in the morning and at bedtime.   No facility-administered medications prior to visit.    Review of Systems {Insert previous labs (optional):23779} {See past labs  Heme  Chem  Endocrine  Serology  Results Review (optional):1}   Objective    There were no vitals taken for this visit. {Insert last BP/Wt (optional):23777}{See vitals history (optional):1}  Physical Exam Vitals reviewed.  Constitutional:      Appearance: She is ill-appearing.     Comments: Pt is uncomfortable, unable to sit up straight, once laying flat, visibly in pain trying to get up  HENT:     Head: Normocephalic.  Eyes:     Conjunctiva/sclera: Conjunctivae normal.  Cardiovascular:     Rate and Rhythm: Normal rate.  Pulmonary:     Effort: Pulmonary effort is normal. No respiratory distress.  Abdominal:     Tenderness: There is abdominal tenderness in the right lower quadrant and suprapubic area. There is no right CVA tenderness.  Neurological:     General: No focal deficit present.     Mental Status: She is alert and oriented to person, place, and time.  Psychiatric:  Mood and Affect: Mood normal.        Behavior: Behavior normal.      No results found for any visits on 07/17/23.  Assessment & Plan    There are no diagnoses linked to this encounter.  ***  No follow-ups on file.       Alfredia Ferguson, PA-C  [facility] [phone] (phone) [phone] (fax)  [facility]

## 2023-07-18 ENCOUNTER — Encounter: Payer: Self-pay | Admitting: Physician Assistant

## 2023-07-31 ENCOUNTER — Other Ambulatory Visit: Payer: Self-pay | Admitting: Family Medicine

## 2023-07-31 ENCOUNTER — Other Ambulatory Visit (HOSPITAL_BASED_OUTPATIENT_CLINIC_OR_DEPARTMENT_OTHER): Payer: Self-pay

## 2023-07-31 DIAGNOSIS — G43909 Migraine, unspecified, not intractable, without status migrainosus: Secondary | ICD-10-CM

## 2023-07-31 MED ORDER — RIZATRIPTAN BENZOATE 10 MG PO TABS
10.0000 mg | ORAL_TABLET | ORAL | 2 refills | Status: DC | PRN
  Filled 2023-07-31: qty 10, 17d supply, fill #0
  Filled 2023-09-13: qty 10, 17d supply, fill #1
  Filled 2023-10-17: qty 10, 17d supply, fill #2

## 2023-09-13 ENCOUNTER — Other Ambulatory Visit: Payer: Self-pay

## 2023-09-13 ENCOUNTER — Other Ambulatory Visit (HOSPITAL_BASED_OUTPATIENT_CLINIC_OR_DEPARTMENT_OTHER): Payer: Self-pay

## 2023-09-25 ENCOUNTER — Other Ambulatory Visit (HOSPITAL_BASED_OUTPATIENT_CLINIC_OR_DEPARTMENT_OTHER): Payer: Self-pay

## 2023-09-25 ENCOUNTER — Encounter: Payer: Self-pay | Admitting: *Deleted

## 2023-09-25 ENCOUNTER — Other Ambulatory Visit: Payer: Self-pay | Admitting: Family Medicine

## 2023-09-25 MED ORDER — ESTRADIOL 2 MG PO TABS
2.0000 mg | ORAL_TABLET | Freq: Every day | ORAL | 0 refills | Status: DC
  Filled 2023-09-25 – 2023-10-17 (×2): qty 90, 90d supply, fill #0

## 2023-10-16 ENCOUNTER — Ambulatory Visit: Admitting: Family Medicine

## 2023-10-17 ENCOUNTER — Other Ambulatory Visit (HOSPITAL_BASED_OUTPATIENT_CLINIC_OR_DEPARTMENT_OTHER): Payer: Self-pay

## 2023-11-06 ENCOUNTER — Other Ambulatory Visit (HOSPITAL_BASED_OUTPATIENT_CLINIC_OR_DEPARTMENT_OTHER): Payer: Self-pay

## 2023-11-06 ENCOUNTER — Other Ambulatory Visit: Payer: Self-pay | Admitting: Family Medicine

## 2023-11-06 DIAGNOSIS — G43909 Migraine, unspecified, not intractable, without status migrainosus: Secondary | ICD-10-CM

## 2023-11-06 NOTE — Telephone Encounter (Signed)
 Should be 50 mg twice daily. Ty.

## 2023-11-07 ENCOUNTER — Other Ambulatory Visit (HOSPITAL_BASED_OUTPATIENT_CLINIC_OR_DEPARTMENT_OTHER): Payer: Self-pay

## 2023-11-16 ENCOUNTER — Encounter: Payer: Self-pay | Admitting: Family Medicine

## 2023-11-16 ENCOUNTER — Other Ambulatory Visit (HOSPITAL_BASED_OUTPATIENT_CLINIC_OR_DEPARTMENT_OTHER): Payer: Self-pay

## 2023-11-19 ENCOUNTER — Encounter: Payer: Self-pay | Admitting: Family Medicine

## 2023-11-19 ENCOUNTER — Other Ambulatory Visit: Payer: Self-pay | Admitting: Family

## 2023-11-19 DIAGNOSIS — G43909 Migraine, unspecified, not intractable, without status migrainosus: Secondary | ICD-10-CM

## 2023-11-19 MED ORDER — TOPIRAMATE 50 MG PO TABS
50.0000 mg | ORAL_TABLET | Freq: Two times a day (BID) | ORAL | 2 refills | Status: DC
  Filled 2023-11-19: qty 180, 90d supply, fill #0

## 2023-11-20 ENCOUNTER — Telehealth: Payer: Self-pay | Admitting: Family Medicine

## 2023-11-20 ENCOUNTER — Other Ambulatory Visit (HOSPITAL_BASED_OUTPATIENT_CLINIC_OR_DEPARTMENT_OTHER): Payer: Self-pay

## 2023-11-20 NOTE — Telephone Encounter (Signed)
 Please review dosing on this medication- per refill history and last visit 02/02/23- patient has been taking 100 mg dose  Copied from CRM #[id number]. Topic: Clinical - Prescription Issue >> Nov 20, 2023 12:42 PM Jethro Morrison wrote: Reason for CRM: Topiramate  100MG  IS SHOWING DISCONTINUED BUT PT STATES SHE NEEDS THIS MEDS AND WAS NOT AWARE IT WAS DISCONTINUED. THE PT WOULD LIKE TO KNOW WHY IT WAS DISCONTINUED. AND SHE IS OUT OF THIS ONE.

## 2023-11-20 NOTE — Telephone Encounter (Signed)
 My last note addressing her migraines shows 100 mg twice daily. Please send this instead. Thx.

## 2023-11-21 ENCOUNTER — Other Ambulatory Visit: Payer: Self-pay

## 2023-11-21 ENCOUNTER — Other Ambulatory Visit (HOSPITAL_BASED_OUTPATIENT_CLINIC_OR_DEPARTMENT_OTHER): Payer: Self-pay

## 2023-11-21 MED ORDER — TOPIRAMATE 100 MG PO TABS
100.0000 mg | ORAL_TABLET | Freq: Two times a day (BID) | ORAL | 1 refills | Status: DC
  Filled 2023-11-21: qty 90, 45d supply, fill #0

## 2023-11-21 MED ORDER — TOPIRAMATE 200 MG PO TABS
200.0000 mg | ORAL_TABLET | Freq: Two times a day (BID) | ORAL | 1 refills | Status: DC
  Filled 2023-11-21: qty 90, 45d supply, fill #0

## 2023-11-21 NOTE — Telephone Encounter (Signed)
 Called pt was advised we have sent Topiramate  100MG .

## 2023-11-26 ENCOUNTER — Other Ambulatory Visit: Payer: Self-pay | Admitting: Family Medicine

## 2023-11-26 ENCOUNTER — Other Ambulatory Visit: Payer: Self-pay

## 2023-11-26 ENCOUNTER — Other Ambulatory Visit (HOSPITAL_BASED_OUTPATIENT_CLINIC_OR_DEPARTMENT_OTHER): Payer: Self-pay

## 2023-11-26 DIAGNOSIS — G43909 Migraine, unspecified, not intractable, without status migrainosus: Secondary | ICD-10-CM

## 2023-11-26 MED ORDER — RIZATRIPTAN BENZOATE 10 MG PO TABS
10.0000 mg | ORAL_TABLET | ORAL | 2 refills | Status: DC | PRN
  Filled 2023-11-26: qty 10, 17d supply, fill #0
  Filled 2023-12-26: qty 10, 17d supply, fill #1
  Filled 2024-02-25: qty 10, 17d supply, fill #2

## 2023-12-19 ENCOUNTER — Telehealth: Payer: Self-pay | Admitting: Family Medicine

## 2023-12-19 ENCOUNTER — Other Ambulatory Visit: Payer: Self-pay

## 2023-12-19 ENCOUNTER — Other Ambulatory Visit: Payer: Self-pay | Admitting: Family

## 2023-12-19 ENCOUNTER — Other Ambulatory Visit: Payer: Self-pay | Admitting: Family Medicine

## 2023-12-19 ENCOUNTER — Encounter: Payer: Self-pay | Admitting: Family Medicine

## 2023-12-19 ENCOUNTER — Other Ambulatory Visit (HOSPITAL_BASED_OUTPATIENT_CLINIC_OR_DEPARTMENT_OTHER): Payer: Self-pay

## 2023-12-19 DIAGNOSIS — G43909 Migraine, unspecified, not intractable, without status migrainosus: Secondary | ICD-10-CM

## 2023-12-19 MED ORDER — TOPIRAMATE 100 MG PO TABS
100.0000 mg | ORAL_TABLET | Freq: Two times a day (BID) | ORAL | 1 refills | Status: DC
  Filled 2023-12-19 – 2024-01-05 (×2): qty 180, 90d supply, fill #0
  Filled 2024-04-02: qty 180, 90d supply, fill #1

## 2023-12-19 MED ORDER — TOPIRAMATE 50 MG PO TABS
50.0000 mg | ORAL_TABLET | Freq: Two times a day (BID) | ORAL | 1 refills | Status: DC
  Filled 2023-12-19: qty 180, 90d supply, fill #0
  Filled 2024-03-19: qty 180, 90d supply, fill #1

## 2023-12-19 NOTE — Telephone Encounter (Signed)
 Copied from CRM [id number]. Topic: Clinical - Prescription Issue >> Dec 19, 2023  7:45 AM Alexandra Cabrera wrote: Reason for CRM: Patient calling frustrated that her medication topiramate  (TOPAMAX ) 50 MG tablet was discontinued and the dosage was changed without her knowledge. Patient is requesting a phone call from her provider or his nurse. She is requesting topiramate  (TOPAMAX ) 50 MG tablet. Stated she will need it by Friday.

## 2023-12-21 ENCOUNTER — Other Ambulatory Visit (HOSPITAL_BASED_OUTPATIENT_CLINIC_OR_DEPARTMENT_OTHER): Payer: Self-pay

## 2024-01-05 ENCOUNTER — Other Ambulatory Visit (HOSPITAL_BASED_OUTPATIENT_CLINIC_OR_DEPARTMENT_OTHER): Payer: Self-pay

## 2024-01-23 ENCOUNTER — Encounter: Payer: Self-pay | Admitting: Family Medicine

## 2024-01-25 ENCOUNTER — Other Ambulatory Visit (HOSPITAL_BASED_OUTPATIENT_CLINIC_OR_DEPARTMENT_OTHER): Payer: Self-pay

## 2024-01-25 ENCOUNTER — Other Ambulatory Visit: Payer: Self-pay | Admitting: Family Medicine

## 2024-01-25 MED ORDER — ESTRADIOL 2 MG PO TABS
2.0000 mg | ORAL_TABLET | Freq: Every day | ORAL | 0 refills | Status: DC
  Filled 2024-01-25: qty 90, 90d supply, fill #0

## 2024-02-04 ENCOUNTER — Other Ambulatory Visit (HOSPITAL_BASED_OUTPATIENT_CLINIC_OR_DEPARTMENT_OTHER): Payer: Self-pay

## 2024-02-04 ENCOUNTER — Encounter: Payer: Self-pay | Admitting: Family Medicine

## 2024-02-04 ENCOUNTER — Ambulatory Visit (INDEPENDENT_AMBULATORY_CARE_PROVIDER_SITE_OTHER): Admitting: Family Medicine

## 2024-02-04 ENCOUNTER — Ambulatory Visit: Payer: Self-pay | Admitting: Family Medicine

## 2024-02-04 VITALS — BP 126/74 | HR 71 | Temp 97.9°F | Resp 16 | Ht 59.0 in | Wt 118.0 lb

## 2024-02-04 DIAGNOSIS — Z0000 Encounter for general adult medical examination without abnormal findings: Secondary | ICD-10-CM | POA: Diagnosis not present

## 2024-02-04 DIAGNOSIS — M25511 Pain in right shoulder: Secondary | ICD-10-CM

## 2024-02-04 DIAGNOSIS — Z5320 Procedure and treatment not carried out because of patient's decision for unspecified reasons: Secondary | ICD-10-CM | POA: Insufficient documentation

## 2024-02-04 DIAGNOSIS — Z0001 Encounter for general adult medical examination with abnormal findings: Secondary | ICD-10-CM

## 2024-02-04 DIAGNOSIS — Z13220 Encounter for screening for lipoid disorders: Secondary | ICD-10-CM

## 2024-02-04 LAB — CBC
HCT: 41.1 % (ref 36.0–46.0)
Hemoglobin: 13.8 g/dL (ref 12.0–15.0)
MCHC: 33.6 g/dL (ref 30.0–36.0)
MCV: 95.6 fl (ref 78.0–100.0)
Platelets: 244 K/uL (ref 150.0–400.0)
RBC: 4.3 Mil/uL (ref 3.87–5.11)
RDW: 12.7 % (ref 11.5–15.5)
WBC: 6.2 K/uL (ref 4.0–10.5)

## 2024-02-04 LAB — LIPID PANEL
Cholesterol: 191 mg/dL (ref 0–200)
HDL: 71.2 mg/dL (ref >39.00)
LDL Cholesterol: 106 mg/dL — ABNORMAL HIGH (ref 0–99)
NonHDL: 120.24
Total CHOL/HDL Ratio: 3
Triglycerides: 73 mg/dL (ref 0.0–149.0)
VLDL: 14.6 mg/dL (ref 0.0–40.0)

## 2024-02-04 LAB — COMPREHENSIVE METABOLIC PANEL WITH GFR
ALT: 18 U/L (ref 0–35)
AST: 19 U/L (ref 0–37)
Albumin: 4.1 g/dL (ref 3.5–5.2)
Alkaline Phosphatase: 53 U/L (ref 39–117)
BUN: 24 mg/dL — ABNORMAL HIGH (ref 6–23)
CO2: 24 meq/L (ref 19–32)
Calcium: 9 mg/dL (ref 8.4–10.5)
Chloride: 110 meq/L (ref 96–112)
Creatinine, Ser: 1.05 mg/dL (ref 0.40–1.20)
GFR: 58.31 mL/min — ABNORMAL LOW (ref >60.00)
Glucose, Bld: 97 mg/dL (ref 70–99)
Potassium: 3.9 meq/L (ref 3.5–5.1)
Sodium: 142 meq/L (ref 135–145)
Total Bilirubin: 0.3 mg/dL (ref 0.2–1.2)
Total Protein: 6 g/dL (ref 6.0–8.3)

## 2024-02-04 MED ORDER — PREDNISONE 20 MG PO TABS
40.0000 mg | ORAL_TABLET | Freq: Every day | ORAL | 0 refills | Status: AC
  Filled 2024-02-04: qty 10, 5d supply, fill #0

## 2024-02-04 NOTE — Progress Notes (Signed)
 Chief Complaint  Patient presents with   Annual Exam    CPE     Well Woman Alexandra Cabrera is here for a complete physical.   Her last physical was >1 year ago.  Current diet: in general, a healthy diet. Current exercise: walking. Weight is stable and she denies fatigue out of ordinary. Seatbelt? Yes Advanced directive? Yes  Health Maintenance Pap/HPV-patient has had a hysterectomy Mammogram- Yes Colon cancer screening-No- declines Shingrix- Yes Tetanus- Yes Hep C screening- Yes HIV screening- Yes  Right shoulder pain Over the past month and a half, the patient has had right shoulder pain.  No injury or change in activity.  It is not improving.  Sharp in nature, located on the top of her shoulder.  Difficult to reproduce with palpation.  Normal range of motion but pain towards the end of it.  She has been using Robaxin , Tylenol , and Motrin  without significant relief.  It is waking her up in the middle of the night.  No neurologic signs or symptoms.  Past Medical History:  Diagnosis Date   Acne 11/01/2015   Allergy    Arthritis    Asthma    childhood and rare as adult   Blood transfusion without reported diagnosis    Diverticulosis 02/04/2018   Mild sigmoid, noted on Colonoscopy   Dyspareunia, female    Family history of adverse reaction to anesthesia     my sisiter has had Malignant hypertheria twice, I have never had that problem; (patient had sevo and succinylcholine with no apparent complication on 06/17/02 at [facility])   Fibula fracture 07/23/2018   Distal fibular fracture   GERD (gastroesophageal reflux disease)    Hemorrhoids 02/04/2018   noted on Colonoscopy   History of appendicitis    History of colon polyps 02/04/2018   History of gallstones    History of kidney stones    Hyperlipidemia, mixed 05/17/2016   Patient denies   IBS (irritable bowel syndrome) 01/03/2015   IC (interstitial cystitis)    Migraines    Pelvic pain    PONV  (postoperative nausea and vomiting)    difficult to wake up.   Preventative health care 01/03/2015   Rash and nonspecific skin eruption 11/01/2015   Seasonal allergies      Past Surgical History:  Procedure Laterality Date   ABDOMINAL HYSTERECTOMY     APPENDECTOMY     CHOLECYSTECTOMY N/A 10/12/2014   Procedure: LAPAROSCOPIC CHOLECYSTECTOMY WITH INTRAOPERATIVE CHOLANGIOGRAM;  Surgeon: Gordy Pina, MD;  Location: [facility] OR;  Service: General;  Laterality: N/A;   COLONOSCOPY  02/04/2018   CYSTOSCOPY WITH RETROGRADE PYELOGRAM, URETEROSCOPY AND STENT PLACEMENT Right 08/04/2019   Procedure: CYSTOSCOPY WITH RETROGRADE PYELOGRAM, RIGHT URETEROSCOPY, BASKET STONE EXTRACTION;  Surgeon: Watt Rush, MD;  Location: [facility];  Service: Urology;  Laterality: Right;   HEMORROIDECTOMY     young adult   LIPOMA EXCISION Right 10/14/2021   Procedure: EXCISION OF RIGHT SHOULDER LIPOMA;  Surgeon: Curvin Deward MOULD, MD;  Location: [facility] OR;  Service: General;  Laterality: Right;   ORIF ANKLE FRACTURE Left 07/30/2018   Procedure: OPEN REDUCTION INTERNAL FIXATION (ORIF) LEFT ANKLE FRACTURE;  Surgeon: Beverley Evalene BIRCH, MD;  Location: [facility];  Service: Orthopedics;  Laterality: Left;   PARTIAL PROCTECTOMY BY TEM N/A 05/29/2018   Procedure: PARTIAL PROCTECTOMY BY TEM EXCISION OF ANAL TAG;  Surgeon: Sheldon Standing, MD;  Location: [facility];  Service: General;  Laterality: N/A;   TOTAL KNEE ARTHROPLASTY  1 knee replacements, 7 knee revision, 28 surgeries on right knee    Medications  Current Outpatient Medications on File Prior to Visit  Medication Sig Dispense Refill   acetaminophen  (TYLENOL ) 325 MG tablet Take 2 tablets (650 mg total) by mouth every 6 (six) hours as needed. 100 tablet 0   amoxicillin  (AMOXIL ) 500 MG capsule Take 4 capsules (2,000 mg total) by mouth 1 hour before dental treatment. 4 capsule 4   cycloSPORINE  (RESTASIS ) 0.05 % ophthalmic emulsion Place 1 drop into both eyes 2 (two) times daily as  directed. 5.5 mL 4   Eflornithine HCl 13.9 % cream Apply 1 Application topically 2 (two) times daily at least 8 hours apart. 45 g 1   estradiol  (ESTRACE ) 2 MG tablet Take 1 tablet (2 mg total) by mouth daily. 90 tablet 0   fluticasone  (FLOVENT  HFA) 110 MCG/ACT inhaler Inhale 2 puffs into the lungs in the morning and at bedtime. Rinse mouth out after use. (Patient taking differently: Inhale 2 puffs into the lungs 2 (two) times daily as needed (wheezing or shortness of breath). Rinse mouth out after use.) 12 g 1   ketorolac  (TORADOL ) 10 MG tablet Take 1 tablet (10 mg total) by mouth every 6 (six) hours as needed. 20 tablet 0   levalbuterol  (XOPENEX  HFA) 45 MCG/ACT inhaler Inhale 1-2 puffs by mouth into the lungs every 6 (six) hours as needed for wheezing or shortness of breath. 15 g 12   loratadine -pseudoephedrine  (CLARITIN -D 24-HOUR) 10-240 MG 24 hr tablet Take 1 tablet by mouth daily. (Patient taking differently: Take 1 tablet by mouth daily as needed for allergies.) 30 tablet 3   methocarbamol  (ROBAXIN ) 500 MG tablet Take 1 tablet (500 mg total) by mouth 3 (three) times daily. 30 tablet 0   Multiple Vitamin (MULTIVITAMIN WITH MINERALS) TABS tablet Take 1 tablet by mouth daily.     rizatriptan  (MAXALT ) 10 MG tablet Take 1 tablet (10 mg total) by mouth as needed for migraine. 10 tablet 2   topiramate  (TOPAMAX ) 100 MG tablet Take 1 tablet (100 mg total) by mouth 2 (two) times daily. Take with 50 mg twice daily for a total of 150 mg twice daily. 180 tablet 1   topiramate  (TOPAMAX ) 50 MG tablet Take 1 tablet (50 mg total) by mouth 2 (two) times daily. Take with 100 mg twice daily for a total of 150 mg twice daily. 180 tablet 1    Allergies Allergies  Allergen Reactions   Latex Anaphylaxis   Phenergan  [Promethazine  Hcl] Other (See Comments)    Shaky, tremors   Reglan [Metoclopramide] Anxiety    Review of Systems: Constitutional:  no unexpected weight changes Eye:  no recent significant change in  vision Ear/Nose/Mouth/Throat:  Ears:  no recent change in hearing Nose/Mouth/Throat:  no complaints of nasal congestion, no sore throat Cardiovascular: no chest pain Respiratory:  no shortness of breath Gastrointestinal:  no abdominal pain, no change in bowel habits GU:  Female: negative for dysuria or pelvic pain Musculoskeletal/Extremities:  + Right shoulder pain Integumentary (Skin/Breast):  no abnormal skin lesions reported Neurologic:  no headaches Endocrine:  denies fatigue  Exam BP 126/74 (BP Location: Left Arm, Patient Position: Sitting)   Pulse 71   Temp 97.9 F (36.6 C) (Oral)   Resp 16   Ht 4' 11 (1.499 m)   Wt 118 lb (53.5 kg)   SpO2 100%   BMI 23.83 kg/m  General:  well developed, well nourished, in no apparent distress Skin:  no  significant moles, warts, or growths Head:  no masses, lesions, or tenderness Eyes:  pupils equal and round, sclera anicteric without injection Ears:  canals without lesions, TMs shiny without retraction, no obvious effusion, no erythema Nose:  nares patent, mucosa normal, and no drainage  Throat/Pharynx:  lips and gingiva without lesion; tongue and uvula midline; non-inflamed pharynx; no exudates or postnasal drainage Neck: neck supple without adenopathy, thyromegaly, or masses Lungs:  clear to auscultation, breath sounds equal bilaterally, no respiratory distress Cardio:  regular rate and rhythm, no LE edema Abdomen:  abdomen soft, nontender; bowel sounds normal; no masses or organomegaly Genital: Defer to GYN Musculoskeletal: Right shoulder with normal active and passive range of motion.  No TTP.  Positive Neer's and empty can on the right.  Negative speeds, crossover, liftoff, O'Brien's, Hawkins.  Symmetrical muscle groups noted without atrophy or deformity Extremities:  no clubbing, cyanosis, or edema, no deformities, no skin discoloration Neuro:  gait normal; deep tendon reflexes normal and symmetric Psych: well oriented with normal  range of affect and appropriate judgment/insight  Assessment and Plan  Well adult exam - Plan: Hepatitis B surface antibody,quantitative, CBC, Comprehensive metabolic panel with GFR, Lipid panel  Right shoulder pain, unspecified chronicity - Plan: predniSONE  (DELTASONE ) 20 MG tablet  Colonoscopy refused   Well 58 y.o. female. Counseled on diet and exercise. Other orders as above. Right shoulder pain: Stretches and exercises provided.  5-day prednisone  burst 40 mg daily.  If still having significant pain, would consider steroid injection and referral.  Send message if no significant prove in the next 3 to 4 weeks. She declined colon cancer screening.  She will let us  know if she changes her mind. Follow up in 6 months. The patient voiced understanding and agreement to the plan.  Mabel [place], DO 02/04/24 9:26 AM

## 2024-02-04 NOTE — Patient Instructions (Addendum)
 Give us  2-3 business days to get the results of your labs back.   Keep the diet clean and stay active.  Heat (pad or rice pillow in microwave) over affected area, 10-15 minutes twice daily.   Ice/cold pack over area for 10-15 min twice daily.  OK to take Tylenol  1000 mg (2 extra strength tabs) or 975 mg (3 regular strength tabs) every 6 hours as needed.  Send me a message in 3-4 weeks if not significantly better.   Let us  know if you need anything.  EXERCISES  RANGE OF MOTION (ROM) AND STRETCHING EXERCISES These exercises may help you when beginning to rehabilitate your injury. While completing these exercises, remember:  Restoring tissue flexibility helps normal motion to return to the joints. This allows healthier, less painful movement and activity. An effective stretch should be held for at least 30 seconds. A stretch should never be painful. You should only feel a gentle lengthening or release in the stretched tissue.  ROM - Pendulum Bend at the waist so that your right / left arm falls away from your body. Support yourself with your opposite hand on a solid surface, such as a table or a countertop. Your right / left arm should be perpendicular to the ground. If it is not perpendicular, you need to lean over farther. Relax the muscles in your right / left arm and shoulder as much as possible. Gently sway your hips and trunk so they move your right / left arm without any use of your right / left shoulder muscles. Progress your movements so that your right / left arm moves side to side, then forward and backward, and finally, both clockwise and counterclockwise. Complete 10-15 repetitions in each direction. Many people use this exercise to relieve discomfort in their shoulder as well as to gain range of motion. Repeat 2 times. Complete this exercise 3 times per week.  STRETCH - Flexion, Standing Stand with good posture. With an underhand grip on your right / left hand and an overhand  grip on the opposite hand, grasp a broomstick or cane so that your hands are a little more than shoulder-width apart. Keeping your right / left elbow straight and shoulder muscles relaxed, push the stick with your opposite hand to raise your right / left arm in front of your body and then overhead. Raise your arm until you feel a stretch in your right / left shoulder, but before you have increased shoulder pain. Try to avoid shrugging your right / left shoulder as your arm rises by keeping your shoulder blade tucked down and toward your mid-back spine. Hold 30 seconds. Slowly return to the starting position. Repeat 2 times. Complete this exercise 3 times per week.  STRETCH - Internal Rotation Place your right / left hand behind your back, palm-up. Throw a towel or belt over your opposite shoulder. Grasp the towel/belt with your right / left hand. While keeping an upright posture, gently pull up on the towel/belt until you feel a stretch in the front of your right / left shoulder. Avoid shrugging your right / left shoulder as your arm rises by keeping your shoulder blade tucked down and toward your mid-back spine. Hold 30. Release the stretch by lowering your opposite hand. Repeat 2 times. Complete this exercise 3 times per week.  STRETCH - External Rotation and Abduction Stagger your stance through a doorframe. It does not matter which foot is forward. As instructed by your physician, physical therapist or athletic trainer, place  your hands: And forearms above your head and on the door frame. And forearms at head-height and on the door frame. At elbow-height and on the door frame. Keeping your head and chest upright and your stomach muscles tight to prevent over-extending your low-back, slowly shift your weight onto your front foot until you feel a stretch across your chest and/or in the front of your shoulders. Hold 30 seconds. Shift your weight to your back foot to release the stretch. Repeat  2 times. Complete this stretch 3 times per week.   STRENGTHENING EXERCISES  These exercises may help you when beginning to rehabilitate your injury. They may resolve your symptoms with or without further involvement from your physician, physical therapist or athletic trainer. While completing these exercises, remember:  Muscles can gain both the endurance and the strength needed for everyday activities through controlled exercises. Complete these exercises as instructed by your physician, physical therapist or athletic trainer. Progress the resistance and repetitions only as guided. You may experience muscle soreness or fatigue, but the pain or discomfort you are trying to eliminate should never worsen during these exercises. If this pain does worsen, stop and make certain you are following the directions exactly. If the pain is still present after adjustments, discontinue the exercise until you can discuss the trouble with your clinician. If advised by your physician, during your recovery, avoid activity or exercises which involve actions that place your right / left hand or elbow above your head or behind your back or head. These positions stress the tissues which are trying to heal.  STRENGTH - Scapular Depression and Adduction With good posture, sit on a firm chair. Supported your arms in front of you with pillows, arm rests or a table top. Have your elbows in line with the sides of your body. Gently draw your shoulder blades down and toward your mid-back spine. Gradually increase the tension without tensing the muscles along the top of your shoulders and the back of your neck. Hold for 3 seconds. Slowly release the tension and relax your muscles completely before completing the next repetition. After you have practiced this exercise, remove the arm support and complete it in standing as well as sitting. Repeat 2 times. Complete this exercise 3 times per week.   STRENGTH - External  Rotators Secure a rubber exercise band/tubing to a fixed object so that it is at the same height as your right / left elbow when you are standing or sitting on a firm surface. Stand or sit so that the secured exercise band/tubing is at your side that is not injured. Bend your elbow 90 degrees. Place a folded towel or small pillow under your right / left arm so that your elbow is a few inches away from your side. Keeping the tension on the exercise band/tubing, pull it away from your body, as if pivoting on your elbow. Be sure to keep your body steady so that the movement is only coming from your shoulder rotating. Hold 3 seconds. Release the tension in a controlled manner as you return to the starting position. Repeat 2 times. Complete this exercise 3 times per week.   STRENGTH - Supraspinatus Stand or sit with good posture. Grasp a 2-3 lb weight or an exercise band/tubing so that your hand is thumbs-up, like when you shake hands. Slowly lift your right / left hand from your thigh into the air, traveling about 30 degrees from straight out at your side. Lift your hand to shoulder height  or as far as you can without increasing any shoulder pain. Initially, many people do not lift their hands above shoulder height. Avoid shrugging your right / left shoulder as your arm rises by keeping your shoulder blade tucked down and toward your mid-back spine. Hold for 3 seconds. Control the descent of your hand as you slowly return to your starting position. Repeat 2 times. Complete this exercise 3 times per week.   STRENGTH - Shoulder Extensors Secure a rubber exercise band/tubing so that it is at the height of your shoulders when you are either standing or sitting on a firm arm-less chair. With a thumbs-up grip, grasp an end of the band/tubing in each hand. Straighten your elbows and lift your hands straight in front of you at shoulder height. Step back away from the secured end of band/tubing until it  becomes tense. Squeezing your shoulder blades together, pull your hands down to the sides of your thighs. Do not allow your hands to go behind you. Hold for 3 seconds. Slowly ease the tension on the band/tubing as you reverse the directions and return to the starting position. Repeat 2 times. Complete this exercise 3 times per week.   STRENGTH - Scapular Retractors Secure a rubber exercise band/tubing so that it is at the height of your shoulders when you are either standing or sitting on a firm arm-less chair. With a palm-down grip, grasp an end of the band/tubing in each hand. Straighten your elbows and lift your hands straight in front of you at shoulder height. Step back away from the secured end of band/tubing until it becomes tense. Squeezing your shoulder blades together, draw your elbows back as you bend them. Keep your upper arm lifted away from your body throughout the exercise. Hold 3 seconds. Slowly ease the tension on the band/tubing as you reverse the directions and return to the starting position. Repeat 2 times. Complete this exercise 3 times per week.  STRENGTH - Scapular Depressors Find a sturdy chair without wheels, such as a from a dining room table. Keeping your feet on the floor, lift your bottom from the seat and lock your elbows. Keeping your elbows straight, allow gravity to pull your body weight down. Your shoulders will rise toward your ears. Raise your body against gravity by drawing your shoulder blades down your back, shortening the distance between your shoulders and ears. Although your feet should always maintain contact with the floor, your feet should progressively support less body weight as you get stronger. Hold 3 seconds. In a controlled and slow manner, lower your body weight to begin the next repetition. Repeat 2 times. Complete this exercise 3 times per week.    This information is not intended to replace advice given to you by your health care provider.  Make sure you discuss any questions you have with your health care provider.   Document Released: 05/31/2005 Document Revised: 08/07/2014 Document Reviewed: 10/29/2008 Elsevier Interactive Patient Education [employer]

## 2024-02-05 LAB — HEPATITIS B SURFACE ANTIBODY, QUANTITATIVE: Hep B S AB Quant (Post): 117 m[IU]/mL (ref >=10)

## 2024-02-06 ENCOUNTER — Other Ambulatory Visit (HOSPITAL_BASED_OUTPATIENT_CLINIC_OR_DEPARTMENT_OTHER): Payer: Self-pay

## 2024-02-13 ENCOUNTER — Other Ambulatory Visit (HOSPITAL_BASED_OUTPATIENT_CLINIC_OR_DEPARTMENT_OTHER): Payer: Self-pay

## 2024-02-13 ENCOUNTER — Encounter: Payer: Self-pay | Admitting: Family Medicine

## 2024-02-13 ENCOUNTER — Other Ambulatory Visit: Payer: Self-pay | Admitting: Family Medicine

## 2024-02-13 MED ORDER — PREDNISONE 5 MG (21) PO TBPK
ORAL_TABLET | ORAL | 0 refills | Status: AC
  Filled 2024-02-13: qty 21, 6d supply, fill #0

## 2024-02-14 ENCOUNTER — Other Ambulatory Visit (HOSPITAL_BASED_OUTPATIENT_CLINIC_OR_DEPARTMENT_OTHER): Payer: Self-pay

## 2024-02-14 NOTE — Telephone Encounter (Signed)
 Error CRM created. Agent to schedule appt.

## 2024-02-14 NOTE — Telephone Encounter (Signed)
 Copied from CRM [id number]. Topic: Appointments - Scheduling Inquiry for Clinic >> Feb 13, 2024  5:08 PM Drema MATSU wrote: Reason for CRM: Patient wants to schedule an appointment for an injection. *Please see message from Dr. Frann on Mychart. *Patient has to work on Wednesday and Thursday.   ----------------------------------------------------------------------- From previous Reason for Contact - Scheduling: Patient/patient representative is calling to schedule an appointment. Refer to attachments for appointment information.

## 2024-03-12 ENCOUNTER — Ambulatory Visit: Admitting: Internal Medicine

## 2024-03-19 ENCOUNTER — Other Ambulatory Visit (HOSPITAL_BASED_OUTPATIENT_CLINIC_OR_DEPARTMENT_OTHER): Payer: Self-pay

## 2024-04-02 ENCOUNTER — Other Ambulatory Visit: Payer: Self-pay | Admitting: Family Medicine

## 2024-04-02 ENCOUNTER — Other Ambulatory Visit (HOSPITAL_BASED_OUTPATIENT_CLINIC_OR_DEPARTMENT_OTHER): Payer: Self-pay

## 2024-04-02 DIAGNOSIS — G43909 Migraine, unspecified, not intractable, without status migrainosus: Secondary | ICD-10-CM

## 2024-04-02 MED ORDER — RIZATRIPTAN BENZOATE 10 MG PO TABS
10.0000 mg | ORAL_TABLET | ORAL | 2 refills | Status: AC | PRN
  Filled 2024-04-02: qty 10, 17d supply, fill #0
  Filled 2024-06-17: qty 10, 17d supply, fill #1
  Filled 2024-07-28: qty 10, 17d supply, fill #2

## 2024-04-02 MED ORDER — ONDANSETRON HCL 4 MG PO TABS
4.0000 mg | ORAL_TABLET | Freq: Three times a day (TID) | ORAL | 2 refills | Status: AC | PRN
  Filled 2024-04-02: qty 20, 7d supply, fill #0

## 2024-04-03 ENCOUNTER — Encounter: Payer: Self-pay | Admitting: Family Medicine

## 2024-04-03 ENCOUNTER — Other Ambulatory Visit (HOSPITAL_BASED_OUTPATIENT_CLINIC_OR_DEPARTMENT_OTHER): Payer: Self-pay

## 2024-04-03 MED ORDER — CIPROFLOXACIN HCL 500 MG PO TABS
500.0000 mg | ORAL_TABLET | Freq: Two times a day (BID) | ORAL | 0 refills | Status: AC
  Filled 2024-04-03: qty 14, 7d supply, fill #0

## 2024-04-03 NOTE — Telephone Encounter (Signed)
 Oneill Bais, [facility]   04/03/2024  2:27 PM  Is someone going to try to call Pt to schedule appt?     [name]   04/03/2024 12:31 PM  Thank you for submitting this issue for investigation. After a thorough review, we have determined that an error was made by an [facility] team member. We have addressed the matter directly with the agent involved. We appreciate you bringing this to our attention.   Error/Investigation Details: Pulled the call. Call ID/JTAPI ID: [id number]. Patient states that she just stepped on a roofing nail, and it went through her flip flop and went into her big toe, and she pulled the nail out. Patient states that it pierced her big toe and it started bleeding. Patient states that she had a right knee replacement and have had several revisions and she is probably going to need an antibiotic because of that and is wanting to know if her doctor would call her in an antibiotic. The specialist stated that she would send the message to one of the PCP nurses so they can send you in an antibiotic, just keep in mind because of the time it probably is not going to get done until tomorrow. The specialist sent the CRM to the clinical pool at the office. The specialist should have sent this to [facility], where the patient could have spoken with a nurse and could have been triaged that same day.      Matti Killingsworth, CMA   04/03/2024  8:21 AM  Appt should have been scheduled. Antibiotics are not prescribed w/o appt.     Aadit Hagood, CMA   04/03/2024  8:20 AM  CRM # [id number] Owner: None Status: Unresolved Open  Priority: High Created on: 04/02/2024 04:15 PM By: Adam Nap, Jasmin    Primary Information   Source  Alexandra Cabrera (Patient)   Subject  Alexandra Cabrera (Patient)   Topic  Clinical - Medication Question    Communication  Reason for CRM: Pt called to request an antibiotic due to her big toe being pierced by a nail, pt has a has knee replacement on right knee and states that  she is up to date to her tetanus shot. Please call her back at [phone] to discuss.       Patient Information   Patient Name Gender DOB SSN  Alexandra, Cabrera Female 12-04-1965 [id number]    Contacts   Contact Date/Time Type Contact Phone/Fax  04/02/2024 04:12 PM EDT Phone (Incoming) Alexandra, [place] (Self) [phone]

## 2024-04-04 ENCOUNTER — Ambulatory Visit: Admitting: Family Medicine

## 2024-04-04 NOTE — Telephone Encounter (Signed)
**Note De-identified  [place] Obfuscation** Please advise 

## 2024-04-17 ENCOUNTER — Other Ambulatory Visit (HOSPITAL_BASED_OUTPATIENT_CLINIC_OR_DEPARTMENT_OTHER): Payer: Self-pay

## 2024-04-21 ENCOUNTER — Other Ambulatory Visit (HOSPITAL_BASED_OUTPATIENT_CLINIC_OR_DEPARTMENT_OTHER): Payer: Self-pay

## 2024-04-21 ENCOUNTER — Other Ambulatory Visit: Payer: Self-pay | Admitting: Family Medicine

## 2024-04-21 MED ORDER — ESTRADIOL 2 MG PO TABS
2.0000 mg | ORAL_TABLET | Freq: Every day | ORAL | 0 refills | Status: DC
  Filled 2024-04-21: qty 90, 90d supply, fill #0

## 2024-04-24 ENCOUNTER — Ambulatory Visit (INDEPENDENT_AMBULATORY_CARE_PROVIDER_SITE_OTHER): Admitting: Psychiatry

## 2024-04-24 ENCOUNTER — Encounter: Payer: Self-pay | Admitting: Psychiatry

## 2024-04-24 DIAGNOSIS — F4310 Post-traumatic stress disorder, unspecified: Secondary | ICD-10-CM | POA: Diagnosis not present
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
## 2024-05-06 ENCOUNTER — Telehealth: Payer: Self-pay | Admitting: Psychiatry

## 2024-05-06 ENCOUNTER — Ambulatory Visit (INDEPENDENT_AMBULATORY_CARE_PROVIDER_SITE_OTHER): Admitting: Psychiatry

## 2024-05-06 DIAGNOSIS — F4310 Post-traumatic stress disorder, unspecified: Secondary | ICD-10-CM

## 2024-05-06 NOTE — Telephone Encounter (Signed)
 Ms. Alexandra Cabrera, Alexandra Cabrera are scheduled for a virtual visit with your provider today.    Just as we do with appointments in the office, we must obtain your consent to participate.  Your consent will be active for this visit and any virtual visit you may have with one of our providers in the next 365 days.    If you have a MyChart account, I can also send a copy of this consent to you electronically.  All virtual visits are billed to your insurance company just like a traditional visit in the office.  As this is a virtual visit, video technology does not allow for your provider to perform a traditional examination.  This may limit your provider's ability to fully assess your condition.  If your provider identifies any concerns that need to be evaluated in person or the need to arrange testing such as labs, EKG, etc, we will make arrangements to do so.    Although advances in technology are sophisticated, we cannot ensure that it will always work on either your end or our end.  If the connection with a video visit is poor, we may have to switch to a telephone visit.  With either a video or telephone visit, we are not always able to ensure that we have a secure connection.   I need to obtain your verbal consent now.   Are you willing to proceed with your visit today?   Alexandra Cabrera has provided verbal consent on 05/06/2024 for a virtual visit (video or telephone).   Silvano Pacini, [facility] 05/06/2024  11:01 AM

## 2024-05-06 NOTE — Progress Notes (Signed)
      [facility] Counselor/Therapist Progress Note  Patient ID: Alexandra Cabrera, MRN: [id number],    Date: 05/06/2024  Time Spent: 47 minutes start 11:01 AM 11:48 AM Virtual Visit via Video Note Connected with patient by a telemedicine/telehealth application, with their informed consent, and verified patient privacy and that I am speaking with the correct person using two identifiers. I discussed the limitations, risks, security and privacy concerns of performing psychotherapy and the availability of in person appointments. I also discussed with the patient that there may be a patient responsible charge related to this service. The patient expressed understanding and agreed to proceed. I discussed the treatment planning with the patient. The patient was provided an opportunity to ask questions and all were answered. The patient agreed with the plan and demonstrated an understanding of the instructions. The patient was advised to call  our office if  symptoms worsen or feel they are in a crisis state and need immediate contact.   Therapist Location: home Patient Location: home    Treatment Type: Individual Therapy  Reported Symptoms: anxiety, flashbacks, rumination, sadness  Mental Status Exam:  Appearance:   Well Groomed     Behavior:  Appropriate  Motor:  Normal  Speech/Language:   Normal Rate  Affect:  Appropriate  Mood:  anxious  Thought process:  normal  Thought content:    WNL  Sensory/Perceptual disturbances:    WNL  Orientation:  oriented to person, place, time/date, situation, and day of week  Attention:  Good  Concentration:  Good  Memory:  WNL  Fund of knowledge:   Good  Insight:    Good  Judgment:   Good  Impulse Control:  Good   Risk Assessment: Danger to Self:  No Self-injurious Behavior: No Danger to Others: No Duty to Warn:no Physical Aggression / Violence:No  Access to Firearms a concern: No  Gang Involvement:No   Subjective: Met with patient via virtual  session. She shared that more things have been surfacing since last session. She shared she has had some memories from childhood have surfaced.  She shared that since her mother has passed more things are surfacing because she couldn't tell her mom what was happening to her. She shared she didn't want to have children due to not wanting things to have happened to her children that happened to her. She shared she needs to deal with the traumatic things that have happened. She shared she has not had as many nightmares since last session.  Developed treatment plan and set goals in session.  Patient was then taught grounded and 5 exercise, counting backwards from 100 by twos, and DBT skill TIP P.  Discussed the importance of getting in touch with her body and recognizing when she needs to utilize the skills and practicing them regularly to try and move things in a more positive direction.  Interventions: Dialectical Behavioral Therapy and Solution-Oriented/Positive Psychology  Diagnosis:   ICD-10-CM   1. PTSD (post-traumatic stress disorder)  F43.10       Plan: Patient is to practice grounding exercises and DBT skill to help decrease anxiety and flashbacks.  Patient is to work with providers on other health issues.  Silvano Pacini, [facility]

## 2024-05-19 ENCOUNTER — Other Ambulatory Visit: Payer: Self-pay | Admitting: Family Medicine

## 2024-05-19 ENCOUNTER — Other Ambulatory Visit (HOSPITAL_COMMUNITY): Payer: Self-pay

## 2024-05-19 ENCOUNTER — Other Ambulatory Visit: Payer: Self-pay

## 2024-05-19 MED ORDER — TOPIRAMATE 100 MG PO TABS
100.0000 mg | ORAL_TABLET | Freq: Two times a day (BID) | ORAL | 1 refills | Status: AC
  Filled 2024-05-19 – 2024-07-03 (×3): qty 180, 90d supply, fill #0

## 2024-05-19 MED ORDER — ESTRADIOL 2 MG PO TABS
2.0000 mg | ORAL_TABLET | Freq: Every day | ORAL | 0 refills | Status: AC
  Filled 2024-05-19 – 2024-07-18 (×2): qty 90, 90d supply, fill #0

## 2024-05-19 MED ORDER — TOPIRAMATE 50 MG PO TABS
50.0000 mg | ORAL_TABLET | Freq: Two times a day (BID) | ORAL | 1 refills | Status: AC
  Filled 2024-05-19 – 2024-06-10 (×3): qty 180, 90d supply, fill #0

## 2024-05-20 ENCOUNTER — Other Ambulatory Visit (HOSPITAL_COMMUNITY): Payer: Self-pay

## 2024-05-20 ENCOUNTER — Ambulatory Visit: Admitting: Psychiatry

## 2024-05-20 ENCOUNTER — Encounter: Payer: Self-pay | Admitting: Psychiatry

## 2024-05-20 DIAGNOSIS — F4310 Post-traumatic stress disorder, unspecified: Secondary | ICD-10-CM

## 2024-05-20 NOTE — Progress Notes (Unsigned)
 [facility] Counselor/Therapist Progress Note  Patient ID: Alexandra Cabrera, MRN: [id number],    Date: 05/20/2024  Time Spent: 53 minutes start time 2:01 PM end time 2:54 PM Virtual Visit via Video Note Connected with patient by a telemedicine/telehealth application, with their informed consent, and verified patient privacy and that I am speaking with the correct person using two identifiers. I discussed the limitations, risks, security and privacy concerns of performing psychotherapy and the availability of in person appointments. I also discussed with the patient that there may be a patient responsible charge related to this service. The patient expressed understanding and agreed to proceed. I discussed the treatment planning with the patient. The patient was provided an opportunity to ask questions and all were answered. The patient agreed with the plan and demonstrated an understanding of the instructions. The patient was advised to call  our office if  symptoms worsen or feel they are in a crisis state and need immediate contact.   Therapist Location: home Patient Location: home    Treatment Type: Individual Therapy  Reported Symptoms: anxiety, triggered responses, focusing issues, flashbacks, rumination  Mental Status Exam:  Appearance:   Well Groomed     Behavior:  Appropriate  Motor:  Normal  Speech/Language:   Normal Rate  Affect:  Appropriate  Mood:  anxious  Thought process:  normal  Thought content:    WNL  Sensory/Perceptual disturbances:    WNL  Orientation:  oriented to person, place, time/date, and situation  Attention:  Good  Concentration:  Good  Memory:  WNL  Fund of knowledge:   Good  Insight:    Good  Judgment:   Good  Impulse Control:  Good   Risk Assessment: Danger to Self:  No Self-injurious Behavior: No Danger to Others: No Duty to Warn:no Physical Aggression / Violence:No  Access to Firearms a concern: No  Gang Involvement:No   Subjective:  Met with patient via virtual session. She shared that work has been stressful. She shared that there is a group of bullies at work and that is difficult. They choose different people at different times to bully. She shared there is a Environmental education officer that is making things difficult. She shared currently she is trying to let as much as she can go but she has stood up for herself. She shared she was called to the office last week due to a text message that a coworker sent concerning her.  Patient was able to confront the issue and show where things were not true concerning her behavior at work. She went on to share that she had coworkers that yelled at her at work and that triggered her past situations and was difficult for her.  Discussed how the situation feels very familiar to other traumas in her life and that will impact her amygdala and make it difficult for her prefrontal cortex to work.  Discussed DBT skills to help ground herself including ST OP and TIPP.  Had patient think through some visuals that could help her be able to ground herself when she gets triggered and to remind herself that she is enough even in the difficult situation.  Also discussed the importance of working on regular affirmations and CBT filters to keep her brain engaged and positive.  Also encouraged patient to work on breathing exercises.  Patient agreed to try plans from session to help get her through her work days.  Interventions: Cognitive Behavioral Therapy, Dialectical Behavioral Therapy, and Solution-Oriented/Positive  Psychology  Diagnosis:   ICD-10-CM   1. PTSD (post-traumatic stress disorder)  F43.10       Plan: Patient is to practice CBT, grounding exercises and DBT skill to help decrease anxiety and flashbacks.  Patient is to work on plans discussed in session to help her manage the stress in her work situation.  Patient is to make sure she is exercising regularly to release negative emotions  appropriately.  Patient is to work with providers on other health issues.   Silvano Pacini, [facility]

## 2024-06-02 ENCOUNTER — Ambulatory Visit (INDEPENDENT_AMBULATORY_CARE_PROVIDER_SITE_OTHER): Admitting: Psychiatry

## 2024-06-02 DIAGNOSIS — F4310 Post-traumatic stress disorder, unspecified: Secondary | ICD-10-CM | POA: Diagnosis not present

## 2024-06-02 NOTE — Progress Notes (Signed)
 [facility] Counselor/Therapist Progress Note  Patient ID: TASHINA CREDIT, MRN: [id number],    Date: 06/02/2024  Time Spent: 52 minutes start time 10:00 AM Virtual Visit via Video Note Connected with patient by a telemedicine/telehealth application, with their informed consent, and verified patient privacy and that I am speaking with the correct person using two identifiers. I discussed the limitations, risks, security and privacy concerns of performing psychotherapy and the availability of in person appointments. I also discussed with the patient that there may be a patient responsible charge related to this service. The patient expressed understanding and agreed to proceed. I discussed the treatment planning with the patient. The patient was provided an opportunity to ask questions and all were answered. The patient agreed with the plan and demonstrated an understanding of the instructions. The patient was advised to call  our office if  symptoms worsen or feel they are in a crisis state and need immediate contact.   Therapist Location: home Patient Location: home    Treatment Type: Individual Therapy  Reported Symptoms: triggered responses, sadness, rumination, anxiety  Mental Status Exam:  Appearance:   Well Groomed     Behavior:  Appropriate  Motor:  Normal  Speech/Language:   Normal Rate  Affect:  Appropriate tearful  Mood:  anxious and sad  Thought process:  normal  Thought content:    WNL  Sensory/Perceptual disturbances:    WNL  Orientation:  oriented to person, place, time/date, and situation  Attention:  Good  Concentration:  Good  Memory:  WNL  Fund of knowledge:   Good  Insight:    Good  Judgment:   Good  Impulse Control:  Good   Risk Assessment: Danger to Self:  No Self-injurious Behavior: No Danger to Others: No Duty to Warn:no Physical Aggression / Violence:No  Access to Firearms a concern: No  Gang Involvement:No   Subjective: Met with patient via  virtual session.  Patient reported that tools from sessions previous had been very helpful in her getting her through her work situation.  Patient stated she decided not to pursue anything with human resources at this time but is starting to look into other options and trying to figure out what she needs to do to find a new position.  Discussed different options with patient.  Patient was also encouraged in the meantime to continue using her filters and to recognize that she does not have to change if she is even in the difficult situation but to focus her attention on her patient's.  Patient went on to share that she and her sisters seem to be handling situations differently especially the loss of her mother.  Patient stated this is a hard time a year with her mother not being present.  She shared she hates all that her mother went through growing up with an alcoholic mother.  Patient stated she has memories of her grandmother having her beer and a refrigerator in her closet right up into her 52s.  Patient also shared that she and her boyfriend have had to break up due to his drinking and that being very triggering for her.  Patient shared she is feeling very stuck in the relationship so she knows that she has to end.  Patient was encouraged to feel good about the progress she is making and to recognize how getting perspective and looking at the facts has helped her make some very hard decisions but things that were going to be good  for her.  Encouraged patient to continue working on her self-care and releasing negative emotions appropriately over the next few weeks.  Interventions: Cognitive Behavioral Therapy, Solution-Oriented/Positive Psychology, and Insight-Oriented  Diagnosis:   ICD-10-CM   1. PTSD (post-traumatic stress disorder)  F43.10       Plan: Patient is to practice CBT, grounding exercises and DBT skill to help decrease anxiety and flashbacks.  Patient is to contain perspective to help her  manage the stress in her work situation.  Patient is to make sure she is exercising regularly to release negative emotions appropriately.  Patient is to work with providers on other health issues.   Silvano Pacini, [facility]

## 2024-06-09 ENCOUNTER — Other Ambulatory Visit (HOSPITAL_COMMUNITY): Payer: Self-pay

## 2024-06-09 ENCOUNTER — Other Ambulatory Visit: Payer: Self-pay

## 2024-06-10 ENCOUNTER — Other Ambulatory Visit (HOSPITAL_COMMUNITY): Payer: Self-pay

## 2024-06-10 ENCOUNTER — Other Ambulatory Visit: Payer: Self-pay

## 2024-06-17 ENCOUNTER — Other Ambulatory Visit (HOSPITAL_BASED_OUTPATIENT_CLINIC_OR_DEPARTMENT_OTHER): Payer: Self-pay

## 2024-07-03 ENCOUNTER — Other Ambulatory Visit: Payer: Self-pay

## 2024-07-04 ENCOUNTER — Other Ambulatory Visit (HOSPITAL_COMMUNITY): Payer: Self-pay

## 2024-07-04 ENCOUNTER — Other Ambulatory Visit: Payer: Self-pay

## 2024-07-18 ENCOUNTER — Other Ambulatory Visit (HOSPITAL_COMMUNITY): Payer: Self-pay

## 2024-07-18 ENCOUNTER — Other Ambulatory Visit: Payer: Self-pay

## 2024-07-28 ENCOUNTER — Other Ambulatory Visit (HOSPITAL_BASED_OUTPATIENT_CLINIC_OR_DEPARTMENT_OTHER): Payer: Self-pay

## 2024-10-28 ENCOUNTER — Institutional Professional Consult (permissible substitution): Admitting: Plastic Surgery
